# Patient Record
Sex: Female | Born: 1950 | Race: White | Hispanic: No | Smoking: Current every day smoker
Health system: Southern US, Community
[De-identification: ages and names within clinical notes are randomized; demographics above are authoritative.]

## PROBLEM LIST (undated history)

## (undated) DIAGNOSIS — I4510 Unspecified right bundle-branch block: Secondary | ICD-10-CM

## (undated) DIAGNOSIS — F32A Depression, unspecified: Secondary | ICD-10-CM

## (undated) DIAGNOSIS — G629 Polyneuropathy, unspecified: Secondary | ICD-10-CM

## (undated) DIAGNOSIS — Z9289 Personal history of other medical treatment: Secondary | ICD-10-CM

## (undated) DIAGNOSIS — R9439 Abnormal result of other cardiovascular function study: Secondary | ICD-10-CM

## (undated) DIAGNOSIS — I10 Essential (primary) hypertension: Secondary | ICD-10-CM

## (undated) DIAGNOSIS — T7840XA Allergy, unspecified, initial encounter: Secondary | ICD-10-CM

## (undated) DIAGNOSIS — M1990 Unspecified osteoarthritis, unspecified site: Secondary | ICD-10-CM

## (undated) DIAGNOSIS — J449 Chronic obstructive pulmonary disease, unspecified: Secondary | ICD-10-CM

## (undated) DIAGNOSIS — I2510 Atherosclerotic heart disease of native coronary artery without angina pectoris: Secondary | ICD-10-CM

## (undated) DIAGNOSIS — F329 Major depressive disorder, single episode, unspecified: Secondary | ICD-10-CM

## (undated) DIAGNOSIS — Z720 Tobacco use: Secondary | ICD-10-CM

## (undated) DIAGNOSIS — E785 Hyperlipidemia, unspecified: Secondary | ICD-10-CM

## (undated) DIAGNOSIS — W19XXXA Unspecified fall, initial encounter: Secondary | ICD-10-CM

## (undated) DIAGNOSIS — J439 Emphysema, unspecified: Secondary | ICD-10-CM

## (undated) HISTORY — DX: Hyperlipidemia, unspecified: E78.5

## (undated) HISTORY — PX: HAND SURGERY: SHX662

## (undated) HISTORY — PX: FOOT SURGERY: SHX648

## (undated) HISTORY — DX: Allergy, unspecified, initial encounter: T78.40XA

## (undated) HISTORY — PX: EYE SURGERY: SHX253

## (undated) HISTORY — DX: Abnormal result of other cardiovascular function study: R94.39

## (undated) HISTORY — DX: Atherosclerotic heart disease of native coronary artery without angina pectoris: I25.10

## (undated) HISTORY — DX: Essential (primary) hypertension: I10

## (undated) HISTORY — DX: Unspecified right bundle-branch block: I45.10

## (undated) HISTORY — PX: BACK SURGERY: SHX140

## (undated) HISTORY — DX: Tobacco use: Z72.0

## (undated) HISTORY — DX: Chronic obstructive pulmonary disease, unspecified: J44.9

## (undated) HISTORY — PX: TUBAL LIGATION: SHX77

---

## 1976-05-31 HISTORY — PX: ABDOMINAL HYSTERECTOMY: SHX81

## 1998-09-14 ENCOUNTER — Encounter: Payer: Self-pay | Admitting: Emergency Medicine

## 1998-09-14 ENCOUNTER — Emergency Department (HOSPITAL_COMMUNITY): Admission: EM | Admit: 1998-09-14 | Discharge: 1998-09-14 | Payer: Self-pay | Admitting: Emergency Medicine

## 2000-03-16 ENCOUNTER — Other Ambulatory Visit: Admission: RE | Admit: 2000-03-16 | Discharge: 2000-03-16 | Payer: Self-pay | Admitting: Internal Medicine

## 2000-05-27 ENCOUNTER — Ambulatory Visit (HOSPITAL_COMMUNITY): Admission: RE | Admit: 2000-05-27 | Discharge: 2000-05-27 | Payer: Self-pay | Admitting: Cardiovascular Disease

## 2004-08-28 ENCOUNTER — Ambulatory Visit: Payer: Self-pay | Admitting: Internal Medicine

## 2004-11-19 ENCOUNTER — Ambulatory Visit: Payer: Self-pay | Admitting: Internal Medicine

## 2004-11-26 ENCOUNTER — Ambulatory Visit: Payer: Self-pay | Admitting: Internal Medicine

## 2004-12-15 ENCOUNTER — Ambulatory Visit: Payer: Self-pay | Admitting: Gastroenterology

## 2004-12-24 ENCOUNTER — Encounter: Payer: Self-pay | Admitting: Internal Medicine

## 2004-12-24 ENCOUNTER — Ambulatory Visit: Payer: Self-pay | Admitting: Gastroenterology

## 2004-12-24 LAB — HM COLONOSCOPY

## 2005-02-23 ENCOUNTER — Ambulatory Visit: Payer: Self-pay | Admitting: Internal Medicine

## 2005-03-02 ENCOUNTER — Ambulatory Visit: Payer: Self-pay | Admitting: Internal Medicine

## 2005-07-09 ENCOUNTER — Ambulatory Visit: Payer: Self-pay | Admitting: Internal Medicine

## 2005-12-23 ENCOUNTER — Ambulatory Visit: Payer: Self-pay | Admitting: Internal Medicine

## 2005-12-31 ENCOUNTER — Ambulatory Visit: Payer: Self-pay | Admitting: Internal Medicine

## 2006-04-29 ENCOUNTER — Ambulatory Visit: Payer: Self-pay | Admitting: Internal Medicine

## 2006-05-13 ENCOUNTER — Ambulatory Visit: Payer: Self-pay | Admitting: Internal Medicine

## 2006-12-28 ENCOUNTER — Telehealth: Payer: Self-pay | Admitting: Internal Medicine

## 2007-01-19 ENCOUNTER — Telehealth: Payer: Self-pay | Admitting: Internal Medicine

## 2007-01-26 ENCOUNTER — Encounter: Payer: Self-pay | Admitting: Internal Medicine

## 2007-02-25 DIAGNOSIS — J309 Allergic rhinitis, unspecified: Secondary | ICD-10-CM

## 2007-02-25 DIAGNOSIS — J441 Chronic obstructive pulmonary disease with (acute) exacerbation: Secondary | ICD-10-CM

## 2007-02-25 DIAGNOSIS — E785 Hyperlipidemia, unspecified: Secondary | ICD-10-CM | POA: Insufficient documentation

## 2007-02-25 DIAGNOSIS — J45909 Unspecified asthma, uncomplicated: Secondary | ICD-10-CM

## 2007-06-07 ENCOUNTER — Telehealth (INDEPENDENT_AMBULATORY_CARE_PROVIDER_SITE_OTHER): Payer: Self-pay | Admitting: *Deleted

## 2007-06-08 ENCOUNTER — Ambulatory Visit: Payer: Self-pay | Admitting: Internal Medicine

## 2007-06-15 ENCOUNTER — Telehealth: Payer: Self-pay | Admitting: Internal Medicine

## 2007-07-14 ENCOUNTER — Telehealth: Payer: Self-pay | Admitting: Internal Medicine

## 2007-07-26 ENCOUNTER — Ambulatory Visit: Payer: Self-pay | Admitting: Internal Medicine

## 2007-07-26 LAB — CONVERTED CEMR LAB
ALT: 19 units/L (ref 0–35)
AST: 20 units/L (ref 0–37)
Albumin: 3.6 g/dL (ref 3.5–5.2)
Alkaline Phosphatase: 55 units/L (ref 39–117)
BUN: 21 mg/dL (ref 6–23)
Basophils Absolute: 0 10*3/uL (ref 0.0–0.1)
Basophils Relative: 0.5 % (ref 0.0–1.0)
Bilirubin Urine: NEGATIVE
Bilirubin, Direct: 0.2 mg/dL (ref 0.0–0.3)
Blood in Urine, dipstick: NEGATIVE
CO2: 30 meq/L (ref 19–32)
Calcium: 9.4 mg/dL (ref 8.4–10.5)
Chloride: 108 meq/L (ref 96–112)
Cholesterol: 122 mg/dL (ref 0–200)
Creatinine, Ser: 1.1 mg/dL (ref 0.4–1.2)
Eosinophils Absolute: 0.1 10*3/uL (ref 0.0–0.6)
Eosinophils Relative: 2.9 % (ref 0.0–5.0)
GFR calc Af Amer: 66 mL/min
GFR calc non Af Amer: 54 mL/min
Glucose, Bld: 92 mg/dL (ref 70–99)
Glucose, Urine, Semiquant: NEGATIVE
HCT: 43.8 % (ref 36.0–46.0)
HDL: 39.8 mg/dL (ref >39.0)
Hemoglobin: 14.3 g/dL (ref 12.0–15.0)
Ketones, urine, test strip: NEGATIVE
LDL Cholesterol: 70 mg/dL (ref 0–99)
Lymphocytes Relative: 25.7 % (ref 12.0–46.0)
MCHC: 32.6 g/dL (ref 30.0–36.0)
MCV: 95.5 fL (ref 78.0–100.0)
Monocytes Absolute: 0.6 10*3/uL (ref 0.2–0.7)
Monocytes Relative: 11 % (ref 3.0–11.0)
Neutro Abs: 3.1 10*3/uL (ref 1.4–7.7)
Neutrophils Relative %: 59.9 % (ref 43.0–77.0)
Nitrite: NEGATIVE
Platelets: 286 10*3/uL (ref 150–400)
Potassium: 4.6 meq/L (ref 3.5–5.1)
Protein, U semiquant: NEGATIVE
RBC: 4.58 M/uL (ref 3.87–5.11)
RDW: 13.1 % (ref 11.5–14.6)
Sodium: 142 meq/L (ref 135–145)
Specific Gravity, Urine: <1.005
TSH: 1.54 microintl units/mL (ref 0.35–5.50)
Total Bilirubin: 0.7 mg/dL (ref 0.3–1.2)
Total CHOL/HDL Ratio: 3.1
Total Protein: 5.8 g/dL — ABNORMAL LOW (ref 6.0–8.3)
Triglycerides: 59 mg/dL (ref 0–149)
Urobilinogen, UA: 0.2
VLDL: 12 mg/dL (ref 0–40)
WBC Urine, dipstick: NEGATIVE
WBC: 5.1 10*3/uL (ref 4.5–10.5)
pH: 6

## 2007-08-07 ENCOUNTER — Encounter: Payer: Self-pay | Admitting: Internal Medicine

## 2007-08-17 ENCOUNTER — Ambulatory Visit: Payer: Self-pay | Admitting: Internal Medicine

## 2007-08-17 DIAGNOSIS — I10 Essential (primary) hypertension: Secondary | ICD-10-CM

## 2007-08-17 DIAGNOSIS — F17200 Nicotine dependence, unspecified, uncomplicated: Secondary | ICD-10-CM | POA: Insufficient documentation

## 2007-08-17 DIAGNOSIS — R0789 Other chest pain: Secondary | ICD-10-CM

## 2007-08-22 ENCOUNTER — Encounter: Payer: Self-pay | Admitting: Internal Medicine

## 2007-08-25 ENCOUNTER — Ambulatory Visit: Payer: Self-pay

## 2007-08-25 ENCOUNTER — Encounter: Payer: Self-pay | Admitting: Internal Medicine

## 2007-08-29 ENCOUNTER — Encounter: Payer: Self-pay | Admitting: Internal Medicine

## 2007-08-29 LAB — HM MAMMOGRAPHY

## 2007-09-12 ENCOUNTER — Ambulatory Visit: Payer: Self-pay | Admitting: Internal Medicine

## 2007-09-12 LAB — CONVERTED CEMR LAB
Cholesterol, target level: 200 mg/dL
HDL goal, serum: 39 mg/dL
LDL Goal: 70 mg/dL

## 2008-02-27 ENCOUNTER — Ambulatory Visit: Payer: Self-pay | Admitting: Internal Medicine

## 2008-02-27 DIAGNOSIS — M479 Spondylosis, unspecified: Secondary | ICD-10-CM | POA: Insufficient documentation

## 2008-06-26 ENCOUNTER — Ambulatory Visit: Payer: Self-pay | Admitting: Internal Medicine

## 2008-06-26 ENCOUNTER — Encounter: Payer: Self-pay | Admitting: Internal Medicine

## 2008-08-09 ENCOUNTER — Ambulatory Visit: Payer: Self-pay | Admitting: Internal Medicine

## 2008-08-09 LAB — CONVERTED CEMR LAB
ALT: 21 units/L (ref 0–35)
AST: 24 units/L (ref 0–37)
Albumin: 3.4 g/dL — ABNORMAL LOW (ref 3.5–5.2)
Alkaline Phosphatase: 56 units/L (ref 39–117)
BUN: 16 mg/dL (ref 6–23)
Basophils Absolute: 0 10*3/uL (ref 0.0–0.1)
Basophils Relative: 0.4 % (ref 0.0–3.0)
Bilirubin Urine: NEGATIVE
Bilirubin, Direct: 0.1 mg/dL (ref 0.0–0.3)
Blood in Urine, dipstick: NEGATIVE
CO2: 31 meq/L (ref 19–32)
Calcium: 9.1 mg/dL (ref 8.4–10.5)
Chloride: 110 meq/L (ref 96–112)
Cholesterol: 142 mg/dL (ref 0–200)
Creatinine, Ser: 0.9 mg/dL (ref 0.4–1.2)
Eosinophils Absolute: 0.2 10*3/uL (ref 0.0–0.7)
Eosinophils Relative: 3 % (ref 0.0–5.0)
GFR calc Af Amer: 82 mL/min
GFR calc non Af Amer: 68 mL/min
Glucose, Bld: 93 mg/dL (ref 70–99)
Glucose, Urine, Semiquant: NEGATIVE
HCT: 41.3 % (ref 36.0–46.0)
HDL: 44.9 mg/dL (ref >39.0)
Hemoglobin: 14 g/dL (ref 12.0–15.0)
Ketones, urine, test strip: NEGATIVE
LDL Cholesterol: 83 mg/dL (ref 0–99)
Lymphocytes Relative: 20.1 % (ref 12.0–46.0)
MCHC: 34 g/dL (ref 30.0–36.0)
MCV: 95.7 fL (ref 78.0–100.0)
Monocytes Absolute: 0.7 10*3/uL (ref 0.1–1.0)
Monocytes Relative: 12.7 % — ABNORMAL HIGH (ref 3.0–12.0)
Neutro Abs: 3.5 10*3/uL (ref 1.4–7.7)
Neutrophils Relative %: 63.8 % (ref 43.0–77.0)
Nitrite: NEGATIVE
Platelets: 243 10*3/uL (ref 150–400)
Potassium: 4 meq/L (ref 3.5–5.1)
Protein, U semiquant: NEGATIVE
RBC: 4.32 M/uL (ref 3.87–5.11)
RDW: 13.4 % (ref 11.5–14.6)
Sodium: 147 meq/L — ABNORMAL HIGH (ref 135–145)
Specific Gravity, Urine: 1.01
TSH: 1.43 microintl units/mL (ref 0.35–5.50)
Total Bilirubin: 0.9 mg/dL (ref 0.3–1.2)
Total CHOL/HDL Ratio: 3.2
Total Protein: 5.9 g/dL — ABNORMAL LOW (ref 6.0–8.3)
Triglycerides: 72 mg/dL (ref 0–149)
Urobilinogen, UA: 0.2
VLDL: 14 mg/dL (ref 0–40)
WBC Urine, dipstick: NEGATIVE
WBC: 5.5 10*3/uL (ref 4.5–10.5)
pH: 6

## 2008-08-16 ENCOUNTER — Ambulatory Visit: Payer: Self-pay | Admitting: Internal Medicine

## 2008-08-29 ENCOUNTER — Telehealth: Payer: Self-pay | Admitting: *Deleted

## 2008-08-29 ENCOUNTER — Encounter: Payer: Self-pay | Admitting: Internal Medicine

## 2008-08-30 ENCOUNTER — Encounter: Admission: RE | Admit: 2008-08-30 | Discharge: 2008-08-30 | Payer: Self-pay | Admitting: Internal Medicine

## 2008-09-26 ENCOUNTER — Ambulatory Visit: Payer: Self-pay | Admitting: Internal Medicine

## 2008-09-26 ENCOUNTER — Telehealth (INDEPENDENT_AMBULATORY_CARE_PROVIDER_SITE_OTHER): Payer: Self-pay | Admitting: *Deleted

## 2008-09-26 DIAGNOSIS — R0602 Shortness of breath: Secondary | ICD-10-CM

## 2008-09-26 DIAGNOSIS — J45901 Unspecified asthma with (acute) exacerbation: Secondary | ICD-10-CM | POA: Insufficient documentation

## 2008-09-27 ENCOUNTER — Ambulatory Visit: Payer: Self-pay | Admitting: Internal Medicine

## 2008-09-27 DIAGNOSIS — F4321 Adjustment disorder with depressed mood: Secondary | ICD-10-CM | POA: Insufficient documentation

## 2008-09-27 DIAGNOSIS — N318 Other neuromuscular dysfunction of bladder: Secondary | ICD-10-CM | POA: Insufficient documentation

## 2008-11-27 ENCOUNTER — Ambulatory Visit: Payer: Self-pay | Admitting: Internal Medicine

## 2008-11-27 DIAGNOSIS — N750 Cyst of Bartholin's gland: Secondary | ICD-10-CM

## 2009-03-27 ENCOUNTER — Ambulatory Visit: Payer: Self-pay | Admitting: Internal Medicine

## 2009-05-06 ENCOUNTER — Ambulatory Visit: Payer: Self-pay | Admitting: Internal Medicine

## 2009-09-22 ENCOUNTER — Ambulatory Visit: Payer: Self-pay | Admitting: Internal Medicine

## 2010-03-30 ENCOUNTER — Encounter: Payer: Self-pay | Admitting: Internal Medicine

## 2010-03-31 ENCOUNTER — Encounter: Payer: Self-pay | Admitting: Internal Medicine

## 2010-04-02 ENCOUNTER — Ambulatory Visit: Payer: Self-pay | Admitting: Internal Medicine

## 2010-07-02 NOTE — Miscellaneous (Signed)
Summary: Orders Update  Clinical Lists Changes  Orders: Added new Test order of T-2 View CXR (71020TC) - Signed 

## 2010-07-02 NOTE — Assessment & Plan Note (Signed)
Summary: J PT CHEST CONGESTION COUGH/PS   Vital Signs:  Patient profile:   60 year old female Weight:      168 pounds Temp:     98.5 degrees F oral BP sitting:   120 / 82  (left arm) Cuff size:   regular  Vitals Entered By: Duard Brady LPN (September 22, 2009 4:07 PM) CC: c/o non productive cough - tight chest  Is Patient Diabetic? No   CC:  c/o non productive cough - tight chest .  History of Present Illness: 60 year old patient who has a history of COPD and asthma.  For the past several days.  She has had basically a refractory nonproductive cough present.  No sputum production, fever, or shortness of breath.  She has noted some intermittent wheezing.  She has been using the Xanax, as well as her Menest medication without much benefit.  She describes some mild associated chest tightness  Preventive Screening-Counseling & Management  Alcohol-Tobacco     Smoking Status: current  Allergies (verified): No Known Drug Allergies  Past History:  Past Medical History: Reviewed history from 08/17/2007 and no changes required. Allergic rhinitis Hyperlipidemia Asthma COPD Hypertension  Review of Systems       The patient complains of anorexia, hoarseness, and prolonged cough.  The patient denies fever, weight loss, weight gain, vision loss, decreased hearing, chest pain, syncope, dyspnea on exertion, peripheral edema, headaches, hemoptysis, abdominal pain, melena, hematochezia, severe indigestion/heartburn, hematuria, incontinence, genital sores, muscle weakness, suspicious skin lesions, transient blindness, difficulty walking, depression, unusual weight change, abnormal bleeding, enlarged lymph nodes, angioedema, and breast masses.    Physical Exam  General:  Well-developed,well-nourished,in no acute distress; alert,appropriate and cooperative throughout examination Head:  Normocephalic and atraumatic without obvious abnormalities. No apparent alopecia or balding. Ears:   External ear exam shows no significant lesions or deformities.  Otoscopic examination reveals clear canals, tympanic membranes are intact bilaterally without bulging, retraction, inflammation or discharge. Hearing is grossly normal bilaterally. Mouth:  Oral mucosa and oropharynx without lesions or exudates.  Teeth in good repair. Neck:  No deformities, masses, or tenderness noted. Lungs:  rare scattered rhonchi.  No wheezing.  O2 saturation 97% Heart:  Normal rate and regular rhythm. S1 and S2 normal without gallop, murmur, click, rub or other extra sounds. pulse rate 76   Impression & Recommendations:  Problem # 1:  COPD (ICD-496)  Her updated medication list for this problem includes:    Advair Diskus 250-50 Mcg/dose Misc (Fluticasone-salmeterol) .Marland Kitchen... 1 puff two times a day    Xopenex Hfa 45 Mcg/act Aero (Levalbuterol tartrate) .Marland Kitchen..Marland Kitchen Two puff by mouth q 4 hour prn  Her updated medication list for this problem includes:    Advair Diskus 250-50 Mcg/dose Misc (Fluticasone-salmeterol) .Marland Kitchen... 1 puff two times a day    Xopenex Hfa 45 Mcg/act Aero (Levalbuterol tartrate) .Marland Kitchen..Marland Kitchen Two puff by mouth q 4 hour prn  Problem # 2:  TOBACCO USE (ICD-305.1)  Problem # 3:  HYPERTENSION (ICD-401.9)  Her updated medication list for this problem includes:    Furosemide 20 Mg Tabs (Furosemide) ..... One by mouth q am  Her updated medication list for this problem includes:    Furosemide 20 Mg Tabs (Furosemide) ..... One by mouth q am  Complete Medication List: 1)  Vytorin 10-20 Mg Tabs (Ezetimibe-simvastatin) .... Once daily 2)  Celebrex 200 Mg Caps (Celecoxib) .... Two times a day 3)  Fluticasone Propionate 50 Mcg/act Susp (Fluticasone propionate) .... Two sprays q nare daily 4)  Advair Diskus 250-50 Mcg/dose Misc (Fluticasone-salmeterol) .Marland Kitchen.. 1 puff two times a day 5)  Zyrtec Allergy 10 Mg Tabs (Cetirizine hcl) .... Once daily 6)  Latisse 0.03 % Soln (Bimatoprost) .... Apply to eye lid  as directed 7)   Ambien Cr 12.5 Mg Cr-tabs (Zolpidem tartrate) .... One by mouth q hs 8)  Alprazolam 0.25 Mg Tabs (Alprazolam) .... One by mouth every 6 hours prn 9)  Xopenex Hfa 45 Mcg/act Aero (Levalbuterol tartrate) .... Two puff by mouth q 4 hour prn 10)  Mupirocin 2 % Oint (Mupirocin) .... App;ly to site bid 11)  Furosemide 20 Mg Tabs (Furosemide) .... One by mouth q am 12)  Aspir-low 81 Mg Tbec (Aspirin) .... Qd 13)  Hydrocodone-homatropine 5-1.5 Mg/5ml Syrp (Hydrocodone-homatropine) .Marland Kitchen.. 1 teaspoon every 6 hours as needed for cough  Patient Instructions: 1)  Please schedule a follow-up appointment as needed. 2)  Drink as much fluid as you can tolerate for the next few days. Prescriptions: HYDROCODONE-HOMATROPINE 5-1.5 MG/5ML SYRP (HYDROCODONE-HOMATROPINE) 1 teaspoon every 6 hours as needed for cough  #6 oz x 2   Entered and Authorized by:   Gordy Savers  MD   Signed by:   Gordy Savers  MD on 09/22/2009   Method used:   Print then Give to Patient   RxID:   [id number]   Appended Document: J PT CHEST CONGESTION COUGH/PS     Allergies: No Known Drug Allergies   Complete Medication List: 1)  Vytorin 10-20 Mg Tabs (Ezetimibe-simvastatin) .... Once daily 2)  Celebrex 200 Mg Caps (Celecoxib) .... Two times a day 3)  Fluticasone Propionate 50 Mcg/act Susp (Fluticasone propionate) .... Two sprays q nare daily 4)  Advair Diskus 250-50 Mcg/dose Misc (Fluticasone-salmeterol) .Marland Kitchen.. 1 puff two times a day 5)  Zyrtec Allergy 10 Mg Tabs (Cetirizine hcl) .... Once daily 6)  Latisse 0.03 % Soln (Bimatoprost) .... Apply to eye lid  as directed 7)  Ambien Cr 12.5 Mg Cr-tabs (Zolpidem tartrate) .... One by mouth q hs 8)  Alprazolam 0.25 Mg Tabs (Alprazolam) .... One by mouth every 6 hours prn 9)  Xopenex Hfa 45 Mcg/act Aero (Levalbuterol tartrate) .... Two puff by mouth q 4 hour prn 10)  Mupirocin 2 % Oint (Mupirocin) .... App;ly to site bid 11)  Furosemide 20 Mg Tabs (Furosemide) .... One  by mouth q am 12)  Aspir-low 81 Mg Tbec (Aspirin) .... Qd 13)  Hydrocodone-homatropine 5-1.5 Mg/26ml Syrp (Hydrocodone-homatropine) .Marland Kitchen.. 1 teaspoon every 6 hours as needed for cough  Other Orders: Depo- Medrol 80mg  (J1040) Admin of Therapeutic Inj  intramuscular or subcutaneous ([id number])    Medication Administration  Injection # 1:    Medication: Depo- Medrol 80mg     Diagnosis: ASTHMA (ICD-493.90)    Route: IM    Site: LUOQ gluteus    Exp Date: 03/2012    Lot #: [id number]    Mfr: Pharmacia    Patient tolerated injection without complications    Given by: Duard Brady LPN (September 22, 2009 5:05 PM)  Orders Added: 1)  Depo- Medrol 80mg  [J1040] 2)  Admin of Therapeutic Inj  intramuscular or subcutaneous [[id number]]

## 2010-07-02 NOTE — Letter (Signed)
Summary: Fax from patient requesting order for Chest  X-Ray  Fax from patient requesting order for Chest  X-Ray   Imported By: Maryln Gottron 04/03/2010 12:18:09  _____________________________________________________________________  External Attachment:    Type:   Image     Comment:   External Document

## 2010-10-16 NOTE — Cardiovascular Report (Signed)
[place]  Patient:    Tara Cruz, Tara Cruz                   MRN: [id number] Proc. Date: 05/27/00 Adm. Date:  [id number] Attending:  Colon Branch CC:         Dietrich Pates, M.D. [facility]             Dr. Lovell Sheehan                        Cardiac Catheterization  PROCEDURE PERFORMED:  Coronary arteriography.  INDICATIONS:  Recurrent chest pain with Cardiolite suggesting inferobasal ischemia.  CORONARY ARTERIOGRAPHY:  Left main coronary artery:  The left main coronary artery was long.  It was normal.  Left anterior descending artery:  The left anterior descending artery was normal.  First and second diagonal branches are normal.  Circumflex coronary artery:  The circumflex coronary artery was normal.  Right coronary artery:  The right coronary artery was dominant and it was normal.  RIGHT ANTERIOR OBLIQUE VENTRICULOGRAPHY:  The RAO ventriculography revealed mild global hypokinesis.  Ejection fraction was in the 50-55% range.  There was no gradient across the aortic valve and no MR.  Aortic pressure was 149/80, LV pressure was 149/60.  IMPRESSION:  The patients chest pain would appear to be noncardiac in etiology.  Her ejection fraction was normal by Cardiolite study at 58%. However, I do think it is worthwhile to follow up in three months with an echocardiogram and MUGA scan to make sure she does not have a nonischemic cardiomyopathy that is developing.  She will be discharged later today if her leg heals well.  She will follow up with Dr. Tenny Craw in a month. DD:  05/27/00 TD:  05/27/00 Job: [id number]

## 2010-11-13 ENCOUNTER — Other Ambulatory Visit: Payer: Self-pay | Admitting: Internal Medicine

## 2010-12-04 ENCOUNTER — Other Ambulatory Visit (INDEPENDENT_AMBULATORY_CARE_PROVIDER_SITE_OTHER): Payer: 59

## 2010-12-04 DIAGNOSIS — Z0000 Encounter for general adult medical examination without abnormal findings: Secondary | ICD-10-CM

## 2010-12-04 LAB — CBC WITH DIFFERENTIAL/PLATELET
Basophils Absolute: 0 10*3/uL (ref 0.0–0.1)
Eosinophils Absolute: 0.1 10*3/uL (ref 0.0–0.7)
HCT: 40.7 % (ref 36.0–46.0)
Hemoglobin: 13.9 g/dL (ref 12.0–15.0)
Lymphs Abs: 1.3 10*3/uL (ref 0.7–4.0)
MCHC: 34.2 g/dL (ref 30.0–36.0)
MCV: 93.8 fl (ref 78.0–100.0)
Monocytes Absolute: 0.5 10*3/uL (ref 0.1–1.0)
Neutro Abs: 3.4 10*3/uL (ref 1.4–7.7)
RDW: 15.9 % — ABNORMAL HIGH (ref 11.5–14.6)

## 2010-12-04 LAB — BASIC METABOLIC PANEL
CO2: 28 mEq/L (ref 19–32)
Calcium: 8.8 mg/dL (ref 8.4–10.5)
Glucose, Bld: 85 mg/dL (ref 70–99)
Potassium: 3.8 mEq/L (ref 3.5–5.1)
Sodium: 145 mEq/L (ref 135–145)

## 2010-12-04 LAB — LIPID PANEL
Cholesterol: 139 mg/dL (ref 0–200)
HDL: 53.1 mg/dL (ref >39.00)
VLDL: 16 mg/dL (ref 0.0–40.0)

## 2010-12-04 LAB — POCT URINALYSIS DIPSTICK
Ketones, UA: NEGATIVE
Protein, UA: NEGATIVE
Spec Grav, UA: 1.005
Urobilinogen, UA: 0.2
pH, UA: 5.5

## 2010-12-04 LAB — HEPATIC FUNCTION PANEL: Albumin: 4 g/dL (ref 3.5–5.2)

## 2010-12-04 LAB — TSH: TSH: 2.39 u[IU]/mL (ref 0.35–5.50)

## 2010-12-09 ENCOUNTER — Encounter: Payer: Self-pay | Admitting: Internal Medicine

## 2010-12-11 ENCOUNTER — Encounter: Payer: Self-pay | Admitting: Internal Medicine

## 2010-12-11 ENCOUNTER — Ambulatory Visit (INDEPENDENT_AMBULATORY_CARE_PROVIDER_SITE_OTHER): Payer: 59 | Admitting: Internal Medicine

## 2010-12-11 DIAGNOSIS — J45909 Unspecified asthma, uncomplicated: Secondary | ICD-10-CM

## 2010-12-11 DIAGNOSIS — I10 Essential (primary) hypertension: Secondary | ICD-10-CM

## 2010-12-11 DIAGNOSIS — F17200 Nicotine dependence, unspecified, uncomplicated: Secondary | ICD-10-CM

## 2010-12-11 DIAGNOSIS — M25559 Pain in unspecified hip: Secondary | ICD-10-CM

## 2010-12-11 DIAGNOSIS — N318 Other neuromuscular dysfunction of bladder: Secondary | ICD-10-CM

## 2010-12-11 DIAGNOSIS — E785 Hyperlipidemia, unspecified: Secondary | ICD-10-CM

## 2010-12-11 DIAGNOSIS — M25552 Pain in left hip: Secondary | ICD-10-CM | POA: Insufficient documentation

## 2010-12-11 DIAGNOSIS — Z0000 Encounter for general adult medical examination without abnormal findings: Secondary | ICD-10-CM

## 2010-12-11 MED ORDER — NABUMETONE 750 MG PO TABS: 750.0000 mg | ORAL_TABLET | Freq: Two times a day (BID) | ORAL | Status: DC

## 2010-12-11 NOTE — Assessment & Plan Note (Signed)
Patient has been on Celebrex 200 twice a day for long-term and this drug seems to have lost its pain control and effectiveness in controlling her osteoarthritis we will change her to Relafen 750 one by mouth twice a day

## 2011-01-01 NOTE — Progress Notes (Signed)
Subjective:    Patient ID: Tara Cruz, female    DOB: [birth year], 60 y.o.   MRN: [id number]  HPI presents for complete physical examination.  Comorbid problems include persistent tobacco abuse hyperlipidemia mild depression with anxiety hypertension and a history of asthma still smoking    Review of Systems  Constitutional: Negative for activity change, appetite change and fatigue.  HENT: Negative for ear pain, congestion, neck pain, postnasal drip and sinus pressure.   Eyes: Negative for redness and visual disturbance.  Respiratory: Negative for cough, shortness of breath and wheezing.   Gastrointestinal: Negative for abdominal pain and abdominal distention.  Genitourinary: Negative for dysuria, frequency and menstrual problem.  Musculoskeletal: Negative for myalgias, joint swelling and arthralgias.  Skin: Negative for rash and wound.  Neurological: Negative for dizziness, weakness and headaches.  Hematological: Negative for adenopathy. Does not bruise/bleed easily.  Psychiatric/Behavioral: Positive for dysphoric mood and decreased concentration. Negative for sleep disturbance.   Past Medical History  Diagnosis Date  . Allergy   . Hyperlipidemia   . Asthma   . COPD (chronic obstructive pulmonary disease)   . Hypertension    Past Surgical History  Procedure Date  . Abdominal hysterectomy   . Foot surgery     reports that she has been smoking.  She does not have any smokeless tobacco history on file. She reports that she does not drink alcohol or use illicit drugs. family history includes Breast cancer in her sister and Coronary artery disease in her father and mother. No Known Allergies      Objective:   Physical Exam  Nursing note and vitals reviewed. Constitutional: She is oriented to person, place, and time. She appears well-developed and well-nourished. No distress.  HENT:  Head: Normocephalic and atraumatic.  Right Ear: External ear normal.  Left Ear:  External ear normal.  Nose: Nose normal.  Mouth/Throat: Oropharynx is clear and moist.  Eyes: Conjunctivae and EOM are normal. Pupils are equal, round, and reactive to light.  Neck: Normal range of motion. Neck supple. No JVD present. No tracheal deviation present. No thyromegaly present.  Cardiovascular: Normal rate, regular rhythm, normal heart sounds and intact distal pulses.   No murmur heard. Pulmonary/Chest: Effort normal and breath sounds normal. She has no wheezes. She exhibits no tenderness.  Abdominal: Soft. Bowel sounds are normal.  Musculoskeletal: Normal range of motion. She exhibits no edema and no tenderness.  Lymphadenopathy:    She has no cervical adenopathy.  Neurological: She is alert and oriented to person, place, and time. She has normal reflexes. No cranial nerve deficit.  Skin: Skin is warm and dry. She is not diaphoretic.  Psychiatric: Her behavior is normal.          Assessment & Plan:   This is a routine physical examination for this healthy  Female. Reviewed all health maintenance protocols including mammography colonoscopy bone density and reviewed appropriate screening labs. Her immunization history was reviewed as well as her current medications and allergies refills of her chronic medications were given and the plan for yearly health maintenance was discussed all orders and referrals were made as appropriate.   We discussed her smoking cessation and the need to cut back on tobacco use she expressed understanding for this and we discussed therapeutic options with her this point she wishes to consider them and get back in touch with [place].  We discussed her adjustment disorder with depressed mood and the role that exercise getting out of the house will play  in improving her mood.  Her blood pressure was stable she had no excessive use of her rescue inhaler in her asthma and finally we addressed her overactive bladder with possible referral to urology we  discussed exercises and diction before bed

## 2011-01-01 NOTE — Patient Instructions (Signed)
Smoking Cessation This document explains the best ways for you to quit smoking and new treatments to help. It lists new medicines that can double or triple your chances of quitting and quitting for good. It also considers ways to avoid relapses and concerns you may have about quitting, including weight gain. NICOTINE: A POWERFUL ADDICTION If you have tried to quit smoking, you know how hard it can be. It is hard because nicotine is a very addictive drug. For some people, it can be as addictive as heroin or cocaine. Usually, people make 2 or 3 tries, or more, before finally being able to quit. Each time you try to quit, you can learn about what helps and what hurts. Quitting takes hard work and a lot of effort, but you can quit smoking. QUITTING SMOKING IS ONE OF THE MOST IMPORTANT THINGS YOU WILL EVER DO:  You will live longer, feel better, and live better.   The impact on your body of quitting smoking is felt almost immediately:   Within 20 minutes, blood pressure decreases. Pulse returns to its normal level.   After 8 hours, carbon monoxide levels in the blood return to normal. Oxygen level increases.   After 24 hours, chance of heart attack starts to decrease. Breath, hair, and body stop smelling like smoke.   After 48 hours, damaged nerve endings begin to recover. Sense of taste and smell improve.   After 72 hours, the body is virtually free of nicotine. Bronchial tubes relax and breathing becomes easier.   After 2 to 12 weeks, lungs can hold more air. Exercise becomes easier and circulation improves.   Quitting will lower your chance of having a heart attack, stroke, cancer, or lung disease:   After 1 year, the risk of coronary heart disease is cut in half.   After 5 years, the risk of stroke falls to the same as a nonsmoker.   After 10 years, the risk of lung cancer is cut in half and the risk of other cancers decreases significantly.   After 15 years, the risk of coronary heart  disease drops, usually to the level of a nonsmoker.   If you are pregnant, quitting smoking will improve your chances of having a healthy baby.   The people you live with, especially your children, will be healthier.   You will have extra money to spend on things other than cigarettes.  FIVE KEYS TO QUITTING Studies have shown that these 5 steps will help you quit smoking and quit for good. You have the best chances of quitting if you use them together: 1. Get ready.  2. Get support and encouragement.  3. Learn new skills and behaviors.  4. Get medicine to reduce your nicotine addiction and use it correctly.  5. Be prepared for relapse or difficult situations. Be determined to continue trying to quit, even if you do not succeed at first.  1. GET READY  Set a quit date.   Change your environment.   Get rid of ALL cigarettes, ashtrays, matches, and lighters in your home, car, and place of work.   Do not let people smoke in your home.   Review your past attempts to quit. Think about what worked and what did not.   Once you quit, do not smoke. NOT EVEN A PUFF!  2. GET SUPPORT AND ENCOURAGEMENT Studies have shown that you have a better chance of being successful if you have help. You can get support in many ways.  Tell   your family, friends, and coworkers that you are going to quit and need their support. Ask them not to smoke around you.   Talk to your caregivers (doctor, dentist, nurse, pharmacist, psychologist, and/or smoking counselor).   Get individual, group, or telephone counseling and support. The more counseling you have, the better your chances are of quitting. Programs are available at local hospitals and health centers. Call your local health department for information about programs in your area.   Spiritual beliefs and practices may help some smokers quit.   Quit meters are small computer programs online or downloadable that keep track of quit statistics, such as amount  of "quit-time," cigarettes not smoked, and money saved.   Many smokers find one or more of the many self-help books available useful in helping them quit and stay off tobacco.  3. LEARN NEW SKILLS AND BEHAVIORS  Try to distract yourself from urges to smoke. Talk to someone, go for a walk, or occupy your time with a task.   When you first try to quit, change your routine. Take a different route to work. Drink tea instead of coffee. Eat breakfast in a different place.   Do something to reduce your stress. Take a hot bath, exercise, or read a book.   Plan something enjoyable to do every day. Reward yourself for not smoking.   Explore interactive web-based programs that specialize in helping you quit.  4. GET MEDICINE AND USE IT CORRECTLY Medicines can help you stop smoking and decrease the urge to smoke. Combining medicine with the above behavioral methods and support can quadruple your chances of successfully quitting smoking. The [place] Food and Drug Administration (FDA) has approved 7 medicines to help you quit smoking. These medicines fall into 3 categories.  Nicotine replacement therapy (delivers nicotine to your body without the negative effects and risks of smoking):   Nicotine gum: Available over-the-counter.   Nicotine lozenges: Available over-the-counter.   Nicotine inhaler: Available by prescription.   Nicotine nasal spray: Available by prescription.   Nicotine skin patches (transdermal): Available by prescription and over-the-counter.   Antidepressant medicine (helps people abstain from smoking, but how this works is unknown):   Bupropion sustained-release (SR) tablets: Available by prescription.   Nicotinic receptor partial agonist (simulates the effect of nicotine in your brain):   Varenicline tartrate tablets: Available by prescription.   Ask your caregiver for advice about which medicines to use and how to use them. Carefully read the information on the package.    Everyone who is trying to quit may benefit from using a medicine. If you are pregnant or trying to become pregnant, nursing an infant, you are under age 18, or you smoke fewer than 10 cigarettes per day, talk to your caregiver before taking any nicotine replacement medicines.   You should stop using a nicotine replacement product and call your caregiver if you experience nausea, dizziness, weakness, vomiting, fast or irregular heartbeat, mouth problems with the lozenge or gum, or redness or swelling of the skin around the patch that does not go away.   Do not use any other product containing nicotine while using a nicotine replacement product.   Talk to your caregiver before using these products if you have diabetes, heart disease, asthma, stomach ulcers, you had a recent heart attack, you have high blood pressure that is not controlled with medicine, a history of irregular heartbeat, or you have been prescribed medicine to help you quit smoking.  5. BE PREPARED FOR RELAPSE OR   DIFFICULT SITUATIONS  Most relapses occur within the first 3 months after quitting. Do not be discouraged if you start smoking again. Remember, most people try several times before they finally quit.   You may have symptoms of withdrawal because your body is used to nicotine. You may crave cigarettes, be irritable, feel very hungry, cough often, get headaches, or have difficulty concentrating.   The withdrawal symptoms are only temporary. They are strongest when you first quit, but they will go away within 10 to 14 days.  Here are some difficult situations to watch for:  Alcohol. Avoid drinking alcohol. Drinking lowers your chances of successfully quitting.   Caffeine. Try to reduce the amount of caffeine you consume. It also lowers your chances of successfully quitting.   Other smokers. Being around smoking can make you want to smoke. Avoid smokers.   Weight gain. Many smokers will gain weight when they quit, usually  less than 10 pounds. Eat a healthy diet and stay active. Do not let weight gain distract you from your main goal, quitting smoking. Some medicines that help you quit smoking may also help delay weight gain. You can always lose the weight gained after you quit.   Bad mood or depression. There are a lot of ways to improve your mood other than smoking.  If you are having problems with any of these situations, talk to your caregiver. SPECIAL SITUATIONS OR CONDITIONS Studies suggest that everyone can quit smoking. Your situation or condition can give you a special reason to quit.  Pregnant women/New mothers: By quitting, you protect your baby's health and your own.   Hospitalized patients: By quitting, you reduce health problems and help healing.   Heart attack patients: By quitting, you reduce your risk of a second heart attack.   Lung, head, and neck cancer patients: By quitting, you reduce your chance of a second cancer.   Parents of children and adolescents: By quitting, you protect your children from illnesses caused by secondhand smoke.  QUESTIONS TO THINK ABOUT Think about the following questions before you try to stop smoking. You may want to talk about your answers with your caregiver.  Why do you want to quit?   If you tried to quit in the past, what helped and what did not?   What will be the most difficult situations for you after you quit? How will you plan to handle them?   Who can help you through the tough times? Your family? Friends? Caregiver?   What pleasures do you get from smoking? What ways can you still get pleasure if you quit?  Here are some questions to ask your caregiver:  How can you help me to be successful at quitting?   What medicine do you think would be best for me and how should I take it?   What should I do if I need more help?   What is smoking withdrawal like? How can I get information on withdrawal?  Quitting takes hard work and a lot of effort,  but you can quit smoking. FOR MORE INFORMATION Smokefree.gov (http://www.smokefree.gov) provides free, accurate, evidence-based information and professional assistance to help support the immediate and long-term needs of people trying to quit smoking. Document Released: 05/11/2001 Document Re-Released: 11/04/2009 Exit[employer] Patient Information 2011 [employer]. 

## 2011-01-06 ENCOUNTER — Other Ambulatory Visit: Payer: Self-pay | Admitting: Internal Medicine

## 2011-01-07 ENCOUNTER — Other Ambulatory Visit: Payer: Self-pay | Admitting: *Deleted

## 2011-01-07 DIAGNOSIS — M25551 Pain in right hip: Secondary | ICD-10-CM

## 2011-01-07 MED ORDER — FUROSEMIDE 20 MG PO TABS: 20.0000 mg | ORAL_TABLET | Freq: Every day | ORAL | Status: DC

## 2011-01-07 MED ORDER — TOLTERODINE TARTRATE ER 2 MG PO CP24: 2.0000 mg | ORAL_CAPSULE | Freq: Every day | ORAL | Status: DC

## 2011-01-07 MED ORDER — EZETIMIBE-SIMVASTATIN 10-20 MG PO TABS: 1.0000 | ORAL_TABLET | Freq: Every day | ORAL | Status: DC

## 2011-01-07 MED ORDER — CETIRIZINE HCL 10 MG PO TABS: 10.0000 mg | ORAL_TABLET | Freq: Every day | ORAL | Status: DC

## 2011-01-07 MED ORDER — NABUMETONE 750 MG PO TABS: 750.0000 mg | ORAL_TABLET | Freq: Two times a day (BID) | ORAL | Status: AC

## 2011-01-07 MED ORDER — ESTRADIOL 0.5 MG PO TABS: 0.5000 mg | ORAL_TABLET | Freq: Every day | ORAL | Status: DC

## 2011-01-07 MED ORDER — FLUTICASONE-SALMETEROL 250-50 MCG/DOSE IN AEPB: 1.0000 | INHALATION_SPRAY | Freq: Two times a day (BID) | RESPIRATORY_TRACT | Status: DC

## 2011-01-07 MED ORDER — ZOLPIDEM TARTRATE ER 12.5 MG PO TBCR: 12.5000 mg | EXTENDED_RELEASE_TABLET | Freq: Every evening | ORAL | Status: DC | PRN

## 2011-01-07 MED ORDER — CELECOXIB 200 MG PO CAPS: 200.0000 mg | ORAL_CAPSULE | Freq: Two times a day (BID) | ORAL | Status: DC

## 2011-01-07 MED ORDER — FLUTICASONE PROPIONATE 50 MCG/ACT NA SUSP: 2.0000 | Freq: Every day | NASAL | Status: DC

## 2011-01-07 MED ORDER — EZETIMIBE-SIMVASTATIN 10-20 MG PO TABS: 1.0000 | ORAL_TABLET | Freq: Two times a day (BID) | ORAL | Status: DC | PRN

## 2011-01-14 ENCOUNTER — Other Ambulatory Visit: Payer: Self-pay | Admitting: *Deleted

## 2011-01-14 MED ORDER — FLUTICASONE-SALMETEROL 250-50 MCG/DOSE IN AEPB: 1.0000 | INHALATION_SPRAY | Freq: Two times a day (BID) | RESPIRATORY_TRACT | Status: DC

## 2011-06-11 ENCOUNTER — Other Ambulatory Visit: Payer: Self-pay | Admitting: Internal Medicine

## 2012-03-01 ENCOUNTER — Other Ambulatory Visit: Payer: Self-pay | Admitting: Internal Medicine

## 2012-03-29 ENCOUNTER — Other Ambulatory Visit: Payer: Self-pay | Admitting: *Deleted

## 2012-03-29 DIAGNOSIS — F17200 Nicotine dependence, unspecified, uncomplicated: Secondary | ICD-10-CM

## 2012-04-06 ENCOUNTER — Telehealth: Payer: Self-pay | Admitting: Internal Medicine

## 2012-04-06 ENCOUNTER — Ambulatory Visit (INDEPENDENT_AMBULATORY_CARE_PROVIDER_SITE_OTHER)
Admission: RE | Admit: 2012-04-06 | Discharge: 2012-04-06 | Disposition: A | Discharge status: Home / Self Care | Payer: 59 | Source: Ambulatory Visit | Attending: Internal Medicine | Admitting: Internal Medicine

## 2012-04-06 DIAGNOSIS — F17200 Nicotine dependence, unspecified, uncomplicated: Secondary | ICD-10-CM

## 2012-04-06 NOTE — Telephone Encounter (Signed)
Pt requesting CPX by end of year. Pt aware no open appts, but if we cannot get here in for CPX, could she have labs done only and CPX done later early next year? Pt work # [phone]

## 2012-04-10 NOTE — Telephone Encounter (Signed)
That will be fine per dr jenkins 

## 2012-04-11 NOTE — Telephone Encounter (Signed)
Pt is sch for cpx labs 05-11-2012 and cpx 06-28-2012 11.30am

## 2012-05-11 ENCOUNTER — Other Ambulatory Visit: Payer: 59

## 2012-05-15 ENCOUNTER — Other Ambulatory Visit (INDEPENDENT_AMBULATORY_CARE_PROVIDER_SITE_OTHER): Payer: 59

## 2012-05-15 DIAGNOSIS — Z0000 Encounter for general adult medical examination without abnormal findings: Secondary | ICD-10-CM

## 2012-05-15 LAB — POCT URINALYSIS DIPSTICK
Protein, UA: NEGATIVE
Spec Grav, UA: 1.02
Urobilinogen, UA: 0.2

## 2012-05-15 LAB — BASIC METABOLIC PANEL
BUN: 23 mg/dL (ref 6–23)
Chloride: 107 mEq/L (ref 96–112)
Glucose, Bld: 89 mg/dL (ref 70–99)
Potassium: 3.8 mEq/L (ref 3.5–5.1)

## 2012-05-15 LAB — HEPATIC FUNCTION PANEL
ALT: 16 U/L (ref 0–35)
Albumin: 4 g/dL (ref 3.5–5.2)
Total Bilirubin: 0.8 mg/dL (ref 0.3–1.2)

## 2012-05-15 LAB — CBC WITH DIFFERENTIAL/PLATELET
Basophils Absolute: 0 10*3/uL (ref 0.0–0.1)
Eosinophils Relative: 2.3 % (ref 0.0–5.0)
HCT: 43 % (ref 36.0–46.0)
Lymphs Abs: 1.6 10*3/uL (ref 0.7–4.0)
MCV: 94.8 fl (ref 78.0–100.0)
Monocytes Absolute: 0.7 10*3/uL (ref 0.1–1.0)
Platelets: 252 10*3/uL (ref 150.0–400.0)
RDW: 14.1 % (ref 11.5–14.6)

## 2012-05-15 LAB — LIPID PANEL
LDL Cholesterol: 70 mg/dL (ref 0–99)
Total CHOL/HDL Ratio: 3

## 2012-05-15 LAB — TSH: TSH: 1.76 u[IU]/mL (ref 0.35–5.50)

## 2012-05-31 DIAGNOSIS — Z9289 Personal history of other medical treatment: Secondary | ICD-10-CM

## 2012-05-31 HISTORY — DX: Personal history of other medical treatment: Z92.89

## 2012-06-28 ENCOUNTER — Encounter: Payer: Self-pay | Admitting: Internal Medicine

## 2012-06-28 ENCOUNTER — Encounter: Payer: 59 | Admitting: Internal Medicine

## 2012-06-28 ENCOUNTER — Ambulatory Visit (INDEPENDENT_AMBULATORY_CARE_PROVIDER_SITE_OTHER): Payer: Self-pay | Admitting: Internal Medicine

## 2012-06-28 VITALS — BP 128/80 | HR 76 | Temp 98.2°F | Resp 16 | Ht 66.5 in | Wt 162.0 lb

## 2012-06-28 DIAGNOSIS — N3281 Overactive bladder: Secondary | ICD-10-CM

## 2012-06-28 DIAGNOSIS — Z0000 Encounter for general adult medical examination without abnormal findings: Secondary | ICD-10-CM

## 2012-06-28 DIAGNOSIS — R0989 Other specified symptoms and signs involving the circulatory and respiratory systems: Secondary | ICD-10-CM

## 2012-06-28 DIAGNOSIS — N318 Other neuromuscular dysfunction of bladder: Secondary | ICD-10-CM

## 2012-06-28 DIAGNOSIS — I10 Essential (primary) hypertension: Secondary | ICD-10-CM

## 2012-06-28 DIAGNOSIS — F959 Tic disorder, unspecified: Secondary | ICD-10-CM

## 2012-06-28 LAB — SEDIMENTATION RATE: Sed Rate: 6 mm/hr (ref 0–22)

## 2012-06-28 MED ORDER — ZOLPIDEM TARTRATE ER 12.5 MG PO TBCR: 12.5000 mg | EXTENDED_RELEASE_TABLET | Freq: Every evening | ORAL | Status: DC | PRN

## 2012-06-28 MED ORDER — TOLTERODINE TARTRATE ER 4 MG PO CP24: 4.0000 mg | ORAL_CAPSULE | Freq: Every day | ORAL | Status: DC

## 2012-06-28 MED ORDER — TOLTERODINE TARTRATE ER 2 MG PO CP24: 4.0000 mg | ORAL_CAPSULE | Freq: Every day | ORAL | Status: DC

## 2012-06-28 NOTE — Patient Instructions (Signed)
Please sign up for my chart to notify me in about 2-3 weeks how the increased dose of Detrol is doing for your urinary frequency

## 2012-06-28 NOTE — Progress Notes (Signed)
Subjective:    Patient ID: Tara Cruz, female    DOB: 06-08-1950, 62 y.o.   MRN: [id number]  HPI  CPX Followed for Asthma with COPD ( former smoker), HTN and hyperlipidemia and overactive bladder  Review of Systems  Constitutional: Negative for activity change, appetite change and fatigue.  HENT: Negative for ear pain, congestion, neck pain, postnasal drip and sinus pressure.   Eyes: Negative for redness and visual disturbance.  Respiratory: Negative for cough, shortness of breath and wheezing.   Gastrointestinal: Negative for abdominal pain and abdominal distention.  Genitourinary: Negative for dysuria, frequency and menstrual problem.  Musculoskeletal: Negative for myalgias, joint swelling and arthralgias.  Skin: Negative for rash and wound.  Neurological: Positive for numbness. Negative for dizziness, weakness and headaches.       Right hand side of the face in 5th cranial nerve?  Hematological: Negative for adenopathy. Does not bruise/bleed easily.  Psychiatric/Behavioral: Negative for sleep disturbance and decreased concentration.   Past Medical History  Diagnosis Date  . Allergy   . Hyperlipidemia   . Asthma   . COPD (chronic obstructive pulmonary disease)   . Hypertension     History   Social History  . Marital Status: Married    Spouse Name: N/A    Number of Children: N/A  . Years of Education: N/A   Occupational History  . Not on file.   Social History Main Topics  . Smoking status: Current Every Day Smoker  . Smokeless tobacco: Not on file  . Alcohol Use: No  . Drug Use: No  . Sexually Active: Yes   Other Topics Concern  . Not on file   Social History Narrative  . No narrative on file    Past Surgical History  Procedure Date  . Abdominal hysterectomy   . Foot surgery     Family History  Problem Relation Age of Onset  . Coronary artery disease Mother   . Coronary artery disease Father   . Breast cancer Sister     No Known  Allergies  Current Outpatient Prescriptions on File Prior to Visit  Medication Sig Dispense Refill  . aspirin 81 MG tablet Take 81 mg by mouth daily.        . CELEBREX 200 MG capsule TAKE 1 CAPSULE TWICE DAILY  180 capsule  2  . cetirizine (ZYRTEC) 10 MG tablet Take 1 tablet (10 mg total) by mouth daily.  90 tablet  3  . DETROL LA 2 MG 24 hr capsule TAKE 1 CAPSULE DAILY  90 capsule  2  . estradiol (ESTRACE) 0.5 MG tablet TAKE 1 TABLET DAILY  90 tablet  2  . fluticasone (FLONASE) 50 MCG/ACT nasal spray Place 2 sprays into the nose daily.  48 g  3  . Fluticasone-Salmeterol (ADVAIR DISKUS) 250-50 MCG/DOSE AEPB Inhale 1 puff into the lungs every 12 (twelve) hours.  3 each  3  . furosemide (LASIX) 20 MG tablet Take 1 tablet (20 mg total) by mouth daily.  90 tablet  3  . mupirocin (BACTROBAN) 2 % ointment Apply topically 3 (three) times daily.        Marland Kitchen tolterodine (DETROL LA) 2 MG 24 hr capsule Take 1 capsule (2 mg total) by mouth daily.  30 capsule  1  . VYTORIN 10-20 MG per tablet TAKE 1 TABLET DAILY  90 tablet  2  . XOPENEX HFA 45 MCG/ACT inhaler INHALE 2 PUFFS EVERY 4 HOURS AS NEEDED  3 Inhaler  3  .  zolpidem (AMBIEN CR) 12.5 MG CR tablet Take 1 tablet (12.5 mg total) by mouth at bedtime as needed for sleep.  30 tablet  3    BP 128/80  Pulse 76  Temp 98.2 F (36.8 C)  Resp 16  Ht 5' 6.5" (1.689 m)  Wt 162 lb (73.483 kg)  BMI 25.76 kg/m2        Objective:   Physical Exam  Nursing note and vitals reviewed. Constitutional: She is oriented to person, place, and time. She appears well-developed and well-nourished. No distress.  HENT:  Head: Normocephalic and atraumatic.  Eyes: Conjunctivae normal and EOM are normal. Pupils are equal, round, and reactive to light.  Neck: Normal range of motion. Neck supple. No JVD present. No tracheal deviation present. No thyromegaly present.  Cardiovascular: Normal rate, regular rhythm and normal heart sounds.   No murmur heard. Pulmonary/Chest:  Effort normal and breath sounds normal. She has no wheezes. She exhibits no tenderness.  Abdominal: Soft. Bowel sounds are normal.  Musculoskeletal: Normal range of motion. She exhibits no edema and no tenderness.  Lymphadenopathy:    She has no cervical adenopathy.  Neurological: She is alert and oriented to person, place, and time. She has normal reflexes. A cranial nerve deficit is present.       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 She is not diaphoretic.  Psychiatric: She has a normal mood and affect. Her behavior is normal.          Assessment & Plan:   This is a routine physical examination for this healthy  Female. Reviewed all health maintenance protocols including mammography colonoscopy bone density and reviewed appropriate screening labs. Her immunization history was reviewed as well as her current medications and allergies refills of her chronic medications were given and the plan for yearly health maintenance was discussed all orders and referrals were made as appropriate.   Facial tic? Differential includes PAD. Carotid study. B12. Consider tegretol if studies negative Check sedimentation rate to make sure that this does not represent a type of arteritis asthma and HTN stable lipids are at goal Increased overactive bladder symptoms so we will titrate the detrol to 4 mg

## 2012-06-29 ENCOUNTER — Encounter: Payer: Self-pay | Admitting: Internal Medicine

## 2012-06-30 NOTE — Progress Notes (Signed)
  Subjective:    Patient ID: Tara Cruz, female    DOB: 02/06/1951, 62 y.o.   MRN: [id number]  HPI  Duplicate chart due to move  Review of Systems     Objective:   Physical Exam        Assessment & Plan:

## 2012-07-03 ENCOUNTER — Encounter (INDEPENDENT_AMBULATORY_CARE_PROVIDER_SITE_OTHER): Payer: 59

## 2012-07-03 DIAGNOSIS — R55 Syncope and collapse: Secondary | ICD-10-CM

## 2012-07-03 DIAGNOSIS — R0989 Other specified symptoms and signs involving the circulatory and respiratory systems: Secondary | ICD-10-CM

## 2012-07-03 DIAGNOSIS — F959 Tic disorder, unspecified: Secondary | ICD-10-CM

## 2012-07-03 DIAGNOSIS — I6529 Occlusion and stenosis of unspecified carotid artery: Secondary | ICD-10-CM

## 2012-07-10 ENCOUNTER — Encounter: Payer: Self-pay | Admitting: Internal Medicine

## 2012-07-10 DIAGNOSIS — T148XXA Other injury of unspecified body region, initial encounter: Secondary | ICD-10-CM

## 2012-08-16 ENCOUNTER — Encounter: Payer: Self-pay | Admitting: Internal Medicine

## 2012-09-14 ENCOUNTER — Telehealth: Payer: Self-pay | Admitting: Internal Medicine

## 2012-09-14 NOTE — Telephone Encounter (Signed)
Patient Information:  Caller Name: Davelyn  Phone: [phone]  Patient: Tara Cruz, Tara Cruz  Gender: Female  DOB: 1950/09/30  Age: 62 Years  PCP: Darryll Capers (Adults only)  Office Follow Up:  Does the office need to follow up with this patient?: Yes  Instructions For The Office: Caller declined the only available appointment with Lubertha Sayres due to dental appointment.  She then states she would like callback from Dr. Lovell Sheehan regarding what steps to take next.  Advised her he is not in the office today. She responded she would like a call back at his earliest convenience.   Symptoms  Reason For Call & Symptoms: Caller relates she has had ongoing problems since November with creepy crawly sensations on right side of head.  Symptoms seem worse.  Neck and ear pain onset 09/13/12.  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  Pain rated at 8 of 10 behind right ear.  Patient is at work.  She asks to be seen.  Reviewed Health History In EMR: Yes  Reviewed Medications In EMR: Yes  Reviewed Allergies In EMR: Yes  Reviewed Surgeries / Procedures: Yes  Date of Onset of Symptoms: Unknown  Treatments Tried: Aleve for pain  Treatments Tried Worked: No  Guideline(s) Used:  Neck Pain or Stiffness  Disposition Per Guideline:   See Today in Office  Reason For Disposition Reached:   Patient wants to be seen  Advice Given:  Apply Cold to the Area Or Heat:   During the first 2 days after a mild injury, apply a cold pack or an ice bag (wrapped in a towel) for 20 minutes four times a day. After 2 days, apply a heating pad or hot water bottle to the most painful area for 20 minutes whenever the pain flares up. Wrap hot water bottles or heating pads in a towel to avoid burns.  Stretching Exercises:  Improve the tone of the neck muscles with 2 or 3 minutes of gentle stretching exercises per day such as touching the chin to each shoulder, touching the ear to each shoulder, and moving the head  forward and backward.  Call Back If:  Numbness or weakness occurs  Bowel or bladder problems occur  You become worse.  Patient Will Follow Care Advice:  YES

## 2012-09-18 ENCOUNTER — Encounter: Payer: Self-pay | Admitting: *Deleted

## 2012-09-18 ENCOUNTER — Other Ambulatory Visit: Payer: Self-pay | Admitting: *Deleted

## 2012-09-18 DIAGNOSIS — R200 Anesthesia of skin: Secondary | ICD-10-CM

## 2012-09-18 DIAGNOSIS — M542 Cervicalgia: Secondary | ICD-10-CM

## 2012-09-18 NOTE — Telephone Encounter (Signed)
Pt is requesting a referral to dr Lorin Picket per dr Lovell Sheehan

## 2012-10-20 ENCOUNTER — Encounter: Payer: Self-pay | Admitting: Internal Medicine

## 2012-12-26 ENCOUNTER — Ambulatory Visit (INDEPENDENT_AMBULATORY_CARE_PROVIDER_SITE_OTHER): Payer: 59 | Admitting: Internal Medicine

## 2012-12-26 ENCOUNTER — Encounter: Payer: Self-pay | Admitting: Internal Medicine

## 2012-12-26 VITALS — BP 170/96 | HR 67 | Temp 98.2°F | Resp 20 | Wt 165.0 lb

## 2012-12-26 DIAGNOSIS — I10 Essential (primary) hypertension: Secondary | ICD-10-CM

## 2012-12-26 DIAGNOSIS — R079 Chest pain, unspecified: Secondary | ICD-10-CM

## 2012-12-26 DIAGNOSIS — F17200 Nicotine dependence, unspecified, uncomplicated: Secondary | ICD-10-CM

## 2012-12-26 DIAGNOSIS — E785 Hyperlipidemia, unspecified: Secondary | ICD-10-CM

## 2012-12-26 MED ORDER — METOPROLOL SUCCINATE ER 50 MG PO TB24: 50.0000 mg | ORAL_TABLET | Freq: Every day | ORAL | Status: DC

## 2012-12-26 MED ORDER — NITROGLYCERIN 0.4 MG SL SUBL: 0.4000 mg | SUBLINGUAL_TABLET | SUBLINGUAL | Status: DC | PRN

## 2012-12-26 NOTE — Patient Instructions (Addendum)
Aspirin 81 mg daily  Smoking tobacco is very bad for your health. You should stop smoking immediately.  Report to the emergency department immediately if you have any recurrent pain  Stress test as discussed  Metoprolol 1 daily  Use nitroglycerin if any recurrent chest pain

## 2012-12-26 NOTE — Progress Notes (Signed)
Subjective:    Patient ID: Tara Cruz, female    DOB: [birth year], 62 y.o.   MRN: [id number]  HPI  62 year old patient with multiple cardiac risk factors who was stable until Sunday at approximately 7:30 PM when she experienced an episode of significant bilateral jaw pain. Pain was also present in the shoulder and across the upper anterior chest. Pain lasted 3-5 minutes and was associated with an episode of diaphoresis. No nausea or shortness of breath. Chest Denies any history of exertional chest pain Cardiac risk factors include hypertension dyslipidemia and ongoing tobacco use. She also has a family history of heart disease. Grandfather died at 45 of cardiac disease. Father and sister status post pacemaker insertion.  For the past 2 days she has felt a bit tired. She has had no further chest pain or jaw pain. After her episode of pain she took a single aspirin tablet.  Nuclear stress test 2009 normal  Past Medical History  Diagnosis Date  . Allergy   . Hyperlipidemia   . Asthma   . COPD (chronic obstructive pulmonary disease)   . Hypertension     History   Social History  . Marital Status: Married    Spouse Name: N/A    Number of Children: N/A  . Years of Education: N/A   Occupational History  . Not on file.   Social History Main Topics  . Smoking status: Current Every Day Smoker  . Smokeless tobacco: Not on file  . Alcohol Use: No  . Drug Use: No  . Sexually Active: Yes   Other Topics Concern  . Not on file   Social History Narrative  . No narrative on file    Past Surgical History  Procedure Laterality Date  . Abdominal hysterectomy    . Foot surgery      Family History  Problem Relation Age of Onset  . Coronary artery disease Mother   . Alzheimer's disease Mother   . Coronary artery disease Father   . Breast cancer Sister     No Known Allergies  Current Outpatient Prescriptions on File Prior to Visit  Medication Sig Dispense Refill  .  aspirin 81 MG tablet Take 81 mg by mouth daily.        . CELEBREX 200 MG capsule TAKE 1 CAPSULE TWICE DAILY  180 capsule  2  . cetirizine (ZYRTEC) 10 MG tablet Take 1 tablet (10 mg total) by mouth daily.  90 tablet  3  . estradiol (ESTRACE) 0.5 MG tablet TAKE 1 TABLET DAILY  90 tablet  2  . fluticasone (FLONASE) 50 MCG/ACT nasal spray Place 2 sprays into the nose daily.  48 g  3  . Fluticasone-Salmeterol (ADVAIR DISKUS) 250-50 MCG/DOSE AEPB Inhale 1 puff into the lungs every 12 (twelve) hours.  3 each  3  . furosemide (LASIX) 20 MG tablet Take 1 tablet (20 mg total) by mouth daily.  90 tablet  3  . mupirocin (BACTROBAN) 2 % ointment Apply topically 3 (three) times daily.        Marland Kitchen tolterodine (DETROL LA) 4 MG 24 hr capsule Take 1 capsule (4 mg total) by mouth daily.  90 capsule  2  . VYTORIN 10-20 MG per tablet TAKE 1 TABLET DAILY  90 tablet  2  . XOPENEX HFA 45 MCG/ACT inhaler INHALE 2 PUFFS EVERY 4 HOURS AS NEEDED  3 Inhaler  3  . zolpidem (AMBIEN CR) 12.5 MG CR tablet Take 1 tablet (12.5 mg total) by  mouth at bedtime as needed for sleep.  30 tablet  3  . [DISCONTINUED] DETROL LA 2 MG 24 hr capsule TAKE 1 CAPSULE DAILY  90 capsule  2   No current facility-administered medications on file prior to visit.    BP 170/96  Pulse 67  Temp(Src) 98.2 F (36.8 C) (Oral)  Resp 20  Wt 165 lb (74.844 kg)  BMI 26.24 kg/m2  SpO2 95%       Review of Systems  Constitutional: Negative.   HENT: Negative for hearing loss, congestion, sore throat, rhinorrhea, dental problem, sinus pressure and tinnitus.   Eyes: Negative for pain, discharge and visual disturbance.  Respiratory: Negative for cough and shortness of breath.   Cardiovascular: Positive for chest pain. Negative for palpitations and leg swelling.  Gastrointestinal: Negative for nausea, vomiting, abdominal pain, diarrhea, constipation, blood in stool and abdominal distention.  Genitourinary: Negative for dysuria, urgency, frequency,  hematuria, flank pain, vaginal bleeding, vaginal discharge, difficulty urinating, vaginal pain and pelvic pain.  Musculoskeletal: Negative for joint swelling, arthralgias and gait problem.  Skin: Negative for rash.  Neurological: Negative for dizziness, syncope, speech difficulty, weakness, numbness and headaches.  Hematological: Negative for adenopathy.  Psychiatric/Behavioral: Negative for behavioral problems, dysphoric mood and agitation. The patient is not nervous/anxious.        Objective:   Physical Exam  Constitutional: She is oriented to person, place, and time. She appears well-developed and well-nourished.  Repeat blood pressure 160/84  HENT:  Head: Normocephalic.  Right Ear: External ear normal.  Left Ear: External ear normal.  Mouth/Throat: Oropharynx is clear and moist.  Eyes: Conjunctivae and EOM are normal. Pupils are equal, round, and reactive to light.  Neck: Normal range of motion. Neck supple. No thyromegaly present.  No bruits  Cardiovascular: Normal rate, regular rhythm, normal heart sounds and intact distal pulses.   Pedal pulses full  Pulmonary/Chest: Effort normal and breath sounds normal.  Abdominal: Soft. Bowel sounds are normal. She exhibits no mass. There is no tenderness.  Musculoskeletal: Normal range of motion.  Lymphadenopathy:    She has no cervical adenopathy.  Neurological: She is alert and oriented to person, place, and time.  Skin: Skin is warm and dry. No rash noted.  Psychiatric: She has a normal mood and affect. Her behavior is normal.          Assessment & Plan:   Symptom complex bothersome for ACS in a patient with multiple cardiac risk factors. We'll check a 12-lead EKG as well as a troponin.  If these are negative will schedule a nuclear stress test Hypertension Dyslipidemia Ongoing tobacco use  Total smoking cessation encouraged Will place on daily aspirin 81 mg daily A new  prescription for when necessary nitroglycerin  dispensed;  We'll place on metoprolol 50 mg daily  Refer to the ED if any recurrent pain

## 2012-12-27 LAB — TROPONIN I: Troponin I: 0.01 ng/mL (ref <0.06)

## 2013-01-02 ENCOUNTER — Other Ambulatory Visit: Payer: Self-pay | Admitting: Internal Medicine

## 2013-01-03 ENCOUNTER — Ambulatory Visit (HOSPITAL_COMMUNITY): Payer: 59 | Attending: Cardiology | Admitting: Radiology

## 2013-01-03 VITALS — BP 158/75 | Ht 66.5 in | Wt 157.0 lb

## 2013-01-03 DIAGNOSIS — F17200 Nicotine dependence, unspecified, uncomplicated: Secondary | ICD-10-CM | POA: Insufficient documentation

## 2013-01-03 DIAGNOSIS — R0989 Other specified symptoms and signs involving the circulatory and respiratory systems: Secondary | ICD-10-CM | POA: Insufficient documentation

## 2013-01-03 DIAGNOSIS — R002 Palpitations: Secondary | ICD-10-CM | POA: Insufficient documentation

## 2013-01-03 DIAGNOSIS — I10 Essential (primary) hypertension: Secondary | ICD-10-CM | POA: Insufficient documentation

## 2013-01-03 DIAGNOSIS — R0609 Other forms of dyspnea: Secondary | ICD-10-CM | POA: Insufficient documentation

## 2013-01-03 DIAGNOSIS — R5383 Other fatigue: Secondary | ICD-10-CM | POA: Insufficient documentation

## 2013-01-03 DIAGNOSIS — R079 Chest pain, unspecified: Secondary | ICD-10-CM | POA: Insufficient documentation

## 2013-01-03 DIAGNOSIS — R0602 Shortness of breath: Secondary | ICD-10-CM

## 2013-01-03 DIAGNOSIS — R5381 Other malaise: Secondary | ICD-10-CM | POA: Insufficient documentation

## 2013-01-03 DIAGNOSIS — R9439 Abnormal result of other cardiovascular function study: Secondary | ICD-10-CM

## 2013-01-03 DIAGNOSIS — I6529 Occlusion and stenosis of unspecified carotid artery: Secondary | ICD-10-CM | POA: Insufficient documentation

## 2013-01-03 DIAGNOSIS — E785 Hyperlipidemia, unspecified: Secondary | ICD-10-CM

## 2013-01-03 DIAGNOSIS — R42 Dizziness and giddiness: Secondary | ICD-10-CM | POA: Insufficient documentation

## 2013-01-03 HISTORY — DX: Abnormal result of other cardiovascular function study: R94.39

## 2013-01-03 MED ORDER — TECHNETIUM TC 99M SESTAMIBI GENERIC - CARDIOLITE
33.0000 | Freq: Once | INTRAVENOUS | Status: AC | PRN
  Administered 2013-01-03: 33 via INTRAVENOUS

## 2013-01-03 MED ORDER — REGADENOSON 0.4 MG/5ML IV SOLN
0.4000 mg | Freq: Once | INTRAVENOUS | Status: AC
  Administered 2013-01-03: 0.4 mg via INTRAVENOUS

## 2013-01-03 MED ORDER — TECHNETIUM TC 99M SESTAMIBI GENERIC - CARDIOLITE
11.0000 | Freq: Once | INTRAVENOUS | Status: AC | PRN
  Administered 2013-01-03: 11 via INTRAVENOUS

## 2013-01-03 NOTE — Progress Notes (Signed)
[facility] SITE 3 NUCLEAR MED [address] [place], [place] [address] [phone]    Cardiology Nuclear Med Study  Tara Cruz is a 62 y.o. female     MRN : [id number]     DOB: 01/24/1951  Procedure Date: 01/03/2013  Nuclear Med Background Indication for Stress Test:  Evaluation for Ischemia History:  2009 MPS Normal EF 54%, 2001 Heart Catheterization Normal EF 50-55% Cardiac Risk Factors: Carotid Disease, Family History - CAD, Hypertension, Lipids and Smoker  Symptoms:  Chest Pain, Diaphoresis, Dizziness, DOE, Fatigue, Light-Headedness and Palpitations   Nuclear Pre-Procedure Caffeine/Decaff Intake:  None > 12 hrs NPO After: 8:30am   Lungs:  clear O2 Sat: 95% on room air. IV 0.9% NS with Angio Cath:  22g  IV Site: R Antecubital x 1, tolerated well IV Started by:  Irean Hong, RN  Chest Size (in):  42 Cup Size: C  Height: 5' 6.5" (1.689 m)  Weight:  157 lb (71.215 kg)  BMI:  Body mass index is 24.96 kg/(m^2). Tech Comments:  Held Toprol x 36 hrs    Nuclear Med Study 1 or 2 day study: 1 day  Stress Test Type:  Lexiscan  Reading MD: Marca Ancona, MD  Order Authorizing Provider:  Eleonore Chiquito, MD  Resting Radionuclide: Technetium 39m Sestamibi  Resting Radionuclide Dose: 10.7 mCi   Stress Radionuclide:  Technetium 2m Sestamibi  Stress Radionuclide Dose: 33.0 mCi           Stress Protocol Rest HR: 52 Stress HR: 91  Rest BP: 158/75 Stress BP: 154/109  Exercise Time (min): 1:49 METS: 1.6   Predicted Max HR: 156 bpm % Max HR: 58.33 bpm Rate Pressure Product: 21308   Dose of Adenosine (mg):  n/a Dose of Lexiscan: 0.4 mg  Dose of Atropine (mg): n/a Dose of Dobutamine: n/a mcg/kg/min (at max HR)  Stress Test Technologist: Bonnita Levan, RN  Nuclear Technologist:  Harlow Asa, CNMT     Rest Procedure:  Myocardial perfusion imaging was performed at rest 45 minutes following the intravenous administration of Technetium 60m Sestamibi. Rest ECG:  NSR-LBBB and NSR-RBBB  Stress Procedure:  The patient received IV Lexiscan 0.4 mg over 15-seconds with concurrent low level exercise and then Technetium 75m Sestamibi was injected at 30-seconds while the patient continued walking one more minute.  Quantitative spect images were obtained after a 45-minute delay. Stress ECG: No significant change from baseline ECG  QPS Raw Data Images:  Normal; no motion artifact; normal heart/lung ratio. Stress Images:  Small, mild basal to mid inferior perfusion defect.  Small, mild apical anteroseptal perfusion defect.  Rest Images:  Small, mild basal to mid inferior perfusion defect.   Subtraction (SDS):  Fixed small, mild basal to mid inferior perfusion defect.  Reversible small, mild apical anteroseptal perfusion defect.  Transient Ischemic Dilatation (Normal <1.22):  n/ Lung/Heart Ratio (Normal <0.45):  0.45  Quantitative Gated Spect Images QGS EDV:  82 ml QGS ESV:  35 ml  Impression Exercise Capacity:  Lexiscan with low level exercise. BP Response:  Normal blood pressure response. Clinical Symptoms:  Mild chest pain. ECG Impression:  RBBB, no ischemic changes.  Comparison with Prior Nuclear Study: No images to compare  Overall Impression:  Low to intermediate risk stress nuclear study with a fixed basal to mid inferior perfusion defect but a reversible apical anteroseptal perfusion defect.  The fixed defect probably represents soft tissue attenuation given normal wall motion, but cannot rule out ischemia with the apical  anteroseptal reversible defect, though it is small. .  LV Ejection Fraction: 58%.  LV Wall Motion:  NL LV Function; NL Wall Motion  Marca Ancona 01/03/2013

## 2013-01-15 ENCOUNTER — Other Ambulatory Visit: Payer: Self-pay | Admitting: *Deleted

## 2013-01-15 ENCOUNTER — Other Ambulatory Visit: Payer: Self-pay | Admitting: Internal Medicine

## 2013-01-15 DIAGNOSIS — R931 Abnormal findings on diagnostic imaging of heart and coronary circulation: Secondary | ICD-10-CM

## 2013-01-17 ENCOUNTER — Other Ambulatory Visit: Payer: Self-pay | Admitting: *Deleted

## 2013-01-17 MED ORDER — FLUTICASONE-SALMETEROL 250-50 MCG/DOSE IN AEPB: 1.0000 | INHALATION_SPRAY | Freq: Two times a day (BID) | RESPIRATORY_TRACT | Status: DC

## 2013-01-17 MED ORDER — FLUTICASONE PROPIONATE 50 MCG/ACT NA SUSP: 2.0000 | Freq: Every day | NASAL | Status: DC

## 2013-01-17 MED ORDER — LEVALBUTEROL TARTRATE 45 MCG/ACT IN AERO: INHALATION_SPRAY | RESPIRATORY_TRACT | Status: DC

## 2013-01-19 ENCOUNTER — Ambulatory Visit (INDEPENDENT_AMBULATORY_CARE_PROVIDER_SITE_OTHER): Payer: 59 | Admitting: Cardiovascular Disease

## 2013-01-19 ENCOUNTER — Encounter: Payer: Self-pay | Admitting: Cardiovascular Disease

## 2013-01-19 ENCOUNTER — Other Ambulatory Visit: Payer: Self-pay | Admitting: *Deleted

## 2013-01-19 VITALS — BP 146/80 | HR 53 | Ht 66.5 in | Wt 163.2 lb

## 2013-01-19 DIAGNOSIS — D689 Coagulation defect, unspecified: Secondary | ICD-10-CM

## 2013-01-19 DIAGNOSIS — R0789 Other chest pain: Secondary | ICD-10-CM

## 2013-01-19 DIAGNOSIS — I10 Essential (primary) hypertension: Secondary | ICD-10-CM

## 2013-01-19 DIAGNOSIS — I4510 Unspecified right bundle-branch block: Secondary | ICD-10-CM | POA: Insufficient documentation

## 2013-01-19 DIAGNOSIS — Z01818 Encounter for other preprocedural examination: Secondary | ICD-10-CM

## 2013-01-19 NOTE — Patient Instructions (Signed)
Dr Allyson Sabal has ordered a cardiac catheterization via the right radial artery.   Your physician has requested that you have a cardiac catheterization. Cardiac catheterization is used to diagnose and/or treat various heart conditions. Doctors may recommend this procedure for a number of different reasons. The most common reason is to evaluate chest pain. Chest pain can be a symptom of coronary artery disease (CAD), and cardiac catheterization can show whether plaque is narrowing or blocking your heart's arteries. This procedure is also used to evaluate the valves, as well as measure the blood flow and oxygen levels in different parts of your heart. For further information please visit [email or web address] Please follow instruction sheet, as given.

## 2013-01-19 NOTE — Assessment & Plan Note (Signed)
Patient had an episode of bilateral jaw pain with chest discomfort associated with diaphoresis approximately 3 weeks ago. A subsequent Myoview stress test showed diaphragmatic attenuation as well as apical ischemia. Based on her risk factors of 50-pack-years of tobacco abuse, hypertension and hyperlipidemia along with her symptoms and positive stress test I suggested that she proceed with outpatient diagnostic coronary arteriography via the right radial approach. She has had a heart catheterization done approximately 10-12 years ago that was apparently normal.

## 2013-01-19 NOTE — Progress Notes (Signed)
01/19/2013 Tara Cruz   1951/03/07  [id number]  Primary Physician Tara Mew, MD Primary Cardiologist: Tara Gess MD Tara Cruz   HPI:  Tara Cruz is a 62 year old engaged Caucasian female mother of 2, grandmother and 7 grandchildren he works as an Research scientist (medical) at [employer]. She was referred by Tara Cruz for cardiovascular evaluation because of chest pain, positive cardiac risk factors, and a positive Myoview stress test. She has 50 pack years history of tobacco abuse smoking one pack per day up until recently now smoking one half pack per day. Other problems include hypertension and hyperlipidemia. She also has COPD. There is no family history of heart disease. She has never had a heart attack or stroke. She barely had a heart catheterization performed 10-12 years ago which was normal. Approximately 3 weeks ago she developed bilateral jaw pain associated with chest pain and diaphoresis lasting 2-3 minutes. She saw Tara Cruz who ordered a stress test that showed apical ischemia.   Current Outpatient Prescriptions  Medication Sig Dispense Refill  . aspirin 81 MG tablet Take 81 mg by mouth daily.        . CELEBREX 200 MG capsule TAKE 1 CAPSULE TWICE DAILY  180 capsule  2  . cetirizine (ZYRTEC) 10 MG tablet Take 1 tablet (10 mg total) by mouth daily.  90 tablet  3  . estradiol (ESTRACE) 0.5 MG tablet TAKE 1 TABLET DAILY  90 tablet  3  . fluticasone (FLONASE) 50 MCG/ACT nasal spray Place 2 sprays into the nose daily.  48 g  3  . Fluticasone-Salmeterol (ADVAIR DISKUS) 250-50 MCG/DOSE AEPB Inhale 1 puff into the lungs every 12 (twelve) hours.  3 each  3  . furosemide (LASIX) 20 MG tablet Take 1 tablet (20 mg total) by mouth daily.  90 tablet  3  . gabapentin (NEURONTIN) 300 MG capsule Take 600 mg by mouth at bedtime.       . levalbuterol (XOPENEX HFA) 45 MCG/ACT inhaler INHALE 2 PUFFS EVERY 4 HOURS AS NEEDED  3 Inhaler  3  . lidocaine  (XYLOCAINE) 5 % ointment Apply 1 application topically as needed.       . metoprolol succinate (TOPROL-XL) 50 MG 24 hr tablet Take 1 tablet (50 mg total) by mouth daily. Take with or immediately following a meal.  90 tablet  3  . mupirocin (BACTROBAN) 2 % ointment Apply topically 3 (three) times daily.        . nitroGLYCERIN (NITROSTAT) 0.4 MG SL tablet Place 1 tablet (0.4 mg total) under the tongue every 5 (five) minutes as needed for chest pain.  50 tablet  3  . RESTASIS 0.05 % ophthalmic emulsion Place 1 drop into both eyes every 12 (twelve) hours.       Marland Kitchen tolterodine (DETROL LA) 4 MG 24 hr capsule Take 1 capsule (4 mg total) by mouth daily.  90 capsule  2  . VYTORIN 10-20 MG per tablet TAKE 1 TABLET DAILY  90 tablet  3  . zolpidem (AMBIEN CR) 12.5 MG CR tablet Take 1 tablet (12.5 mg total) by mouth at bedtime as needed for sleep.  30 tablet  3  . [DISCONTINUED] DETROL LA 2 MG 24 hr capsule TAKE 1 CAPSULE DAILY  90 capsule  2   No current facility-administered medications for this visit.    No Known Allergies  History   Social History  . Marital Status: Married    Spouse Name: N/A  Number of Children: N/A  . Years of Education: N/A   Occupational History  . Not on file.   Social History Main Topics  . Smoking status: Current Every Day Smoker  . Smokeless tobacco: Not on file  . Alcohol Use: No  . Drug Use: No  . Sexual Activity: Yes   Other Topics Concern  . Not on file   Social History Narrative  . No narrative on file     Review of Systems: General: negative for chills, fever, night sweats or weight changes.  Cardiovascular: negative for chest pain, dyspnea on exertion, edema, orthopnea, palpitations, paroxysmal nocturnal dyspnea or shortness of breath Dermatological: negative for rash Respiratory: negative for cough or wheezing Urologic: negative for hematuria Abdominal: negative for nausea, vomiting, diarrhea, bright red blood per rectum, melena, or  hematemesis Neurologic: negative for visual changes, syncope, or dizziness All other systems reviewed and are otherwise negative except as noted above.    Blood pressure 146/80, pulse 53, height 5' 6.5" (1.689 m), weight 163 lb 3.2 oz (74.027 kg).  General appearance: alert and no distress Neck: no adenopathy, no carotid bruit, no JVD, supple, symmetrical, trachea midline and thyroid not enlarged, symmetric, no tenderness/mass/nodules Lungs: clear to auscultation bilaterally Heart: regular rate and rhythm, S1, S2 normal, no murmur, click, rub or gallop Abdomen: soft, non-tender; bowel sounds normal; no masses,  no organomegaly Extremities: extremities normal, atraumatic, no cyanosis or edema Pulses: 2+ and symmetric  EKG sinus bradycardia at 53 with a right bundle branch block and left anterior fascicular block  ASSESSMENT AND PLAN:   CHEST PAIN, ATYPICAL Patient had an episode of bilateral jaw pain with chest discomfort associated with diaphoresis approximately 3 weeks ago. A subsequent Myoview stress test showed diaphragmatic attenuation as well as apical ischemia. Based on her risk factors of 50-pack-years of tobacco abuse, hypertension and hyperlipidemia along with her symptoms and positive stress test I suggested that she proceed with outpatient diagnostic coronary arteriography via the right radial approach. She has had a heart catheterization done approximately 10-12 years ago that was apparently normal.      Tara Gess MD [facility], [facility] 01/19/2013 10:21 AM

## 2013-01-23 LAB — BASIC METABOLIC PANEL
BUN: 25 mg/dL — ABNORMAL HIGH (ref 6–23)
CO2: 28 mEq/L (ref 19–32)
Calcium: 9.6 mg/dL (ref 8.4–10.5)
Chloride: 107 mEq/L (ref 96–112)
Creat: 1.02 mg/dL (ref 0.50–1.10)
Glucose, Bld: 89 mg/dL (ref 70–99)

## 2013-01-23 LAB — APTT: aPTT: 28 seconds (ref 24–37)

## 2013-01-23 LAB — CBC
MCH: 31.2 pg (ref 26.0–34.0)
MCHC: 33.2 g/dL (ref 30.0–36.0)
Platelets: 251 10*3/uL (ref 150–400)
RDW: 13.8 % (ref 11.5–15.5)

## 2013-01-23 LAB — PROTIME-INR: Prothrombin Time: 12.1 seconds (ref 11.6–15.2)

## 2013-01-24 ENCOUNTER — Encounter (HOSPITAL_COMMUNITY): Payer: Self-pay | Admitting: Pharmacy Technician

## 2013-01-25 ENCOUNTER — Encounter: Payer: Self-pay | Admitting: *Deleted

## 2013-01-26 ENCOUNTER — Ambulatory Visit: Payer: 59 | Admitting: Internal Medicine

## 2013-01-30 ENCOUNTER — Ambulatory Visit (HOSPITAL_COMMUNITY)
Admission: RE | Admit: 2013-01-30 | Discharge: 2013-01-30 | Disposition: A | Discharge status: Home / Self Care | Payer: 59 | Source: Ambulatory Visit | Attending: Cardiovascular Disease | Admitting: Cardiovascular Disease

## 2013-01-30 ENCOUNTER — Encounter (HOSPITAL_COMMUNITY)
Admission: RE | Disposition: A | Discharge status: Home / Self Care | Payer: Self-pay | Source: Ambulatory Visit | Attending: Cardiovascular Disease

## 2013-01-30 DIAGNOSIS — F17200 Nicotine dependence, unspecified, uncomplicated: Secondary | ICD-10-CM | POA: Insufficient documentation

## 2013-01-30 DIAGNOSIS — Z7982 Long term (current) use of aspirin: Secondary | ICD-10-CM | POA: Insufficient documentation

## 2013-01-30 DIAGNOSIS — I2510 Atherosclerotic heart disease of native coronary artery without angina pectoris: Secondary | ICD-10-CM | POA: Insufficient documentation

## 2013-01-30 DIAGNOSIS — R079 Chest pain, unspecified: Secondary | ICD-10-CM | POA: Insufficient documentation

## 2013-01-30 DIAGNOSIS — Z01818 Encounter for other preprocedural examination: Secondary | ICD-10-CM

## 2013-01-30 DIAGNOSIS — I10 Essential (primary) hypertension: Secondary | ICD-10-CM | POA: Insufficient documentation

## 2013-01-30 DIAGNOSIS — Z79899 Other long term (current) drug therapy: Secondary | ICD-10-CM | POA: Insufficient documentation

## 2013-01-30 DIAGNOSIS — R0789 Other chest pain: Secondary | ICD-10-CM

## 2013-01-30 DIAGNOSIS — E785 Hyperlipidemia, unspecified: Secondary | ICD-10-CM | POA: Insufficient documentation

## 2013-01-30 DIAGNOSIS — R9439 Abnormal result of other cardiovascular function study: Secondary | ICD-10-CM | POA: Insufficient documentation

## 2013-01-30 DIAGNOSIS — IMO0002 Reserved for concepts with insufficient information to code with codable children: Secondary | ICD-10-CM | POA: Insufficient documentation

## 2013-01-30 HISTORY — PX: LEFT HEART CATHETERIZATION WITH CORONARY ANGIOGRAM: SHX5451

## 2013-01-30 HISTORY — PX: CARDIAC CATHETERIZATION: SHX172

## 2013-01-30 HISTORY — DX: Atherosclerotic heart disease of native coronary artery without angina pectoris: I25.10

## 2013-01-30 SURGERY — LEFT HEART CATHETERIZATION WITH CORONARY ANGIOGRAM
Anesthesia: LOCAL

## 2013-01-30 MED ORDER — HEPARIN SODIUM (PORCINE) 1000 UNIT/ML IJ SOLN
INTRAMUSCULAR | Status: AC
  Filled 2013-01-30: qty 1

## 2013-01-30 MED ORDER — ACETAMINOPHEN 325 MG PO TABS: 650.0000 mg | ORAL_TABLET | ORAL | Status: DC | PRN

## 2013-01-30 MED ORDER — ASPIRIN 81 MG PO CHEW
CHEWABLE_TABLET | ORAL | Status: AC
  Filled 2013-01-30: qty 4

## 2013-01-30 MED ORDER — SODIUM CHLORIDE 0.9 % IJ SOLN: 3.0000 mL | INTRAMUSCULAR | Status: DC | PRN

## 2013-01-30 MED ORDER — ASPIRIN 81 MG PO CHEW: 81.0000 mg | CHEWABLE_TABLET | Freq: Every day | ORAL | Status: DC

## 2013-01-30 MED ORDER — VERAPAMIL HCL 2.5 MG/ML IV SOLN
INTRAVENOUS | Status: AC
  Filled 2013-01-30: qty 2

## 2013-01-30 MED ORDER — NITROGLYCERIN 0.2 MG/ML ON CALL CATH LAB
INTRAVENOUS | Status: AC
  Filled 2013-01-30: qty 1

## 2013-01-30 MED ORDER — ASPIRIN 81 MG PO CHEW
324.0000 mg | CHEWABLE_TABLET | ORAL | Status: AC
  Administered 2013-01-30: 324 mg via ORAL

## 2013-01-30 MED ORDER — FENTANYL CITRATE 0.05 MG/ML IJ SOLN
INTRAMUSCULAR | Status: AC
  Filled 2013-01-30: qty 2

## 2013-01-30 MED ORDER — MIDAZOLAM HCL 2 MG/2ML IJ SOLN
INTRAMUSCULAR | Status: AC
  Filled 2013-01-30: qty 2

## 2013-01-30 MED ORDER — DIAZEPAM 5 MG PO TABS
ORAL_TABLET | ORAL | Status: AC
  Filled 2013-01-30: qty 1

## 2013-01-30 MED ORDER — SODIUM CHLORIDE 0.9 % IV SOLN
INTRAVENOUS | Status: DC
  Administered 2013-01-30: 06:00:00 via INTRAVENOUS

## 2013-01-30 MED ORDER — LIDOCAINE HCL (PF) 1 % IJ SOLN
INTRAMUSCULAR | Status: AC
  Filled 2013-01-30: qty 30

## 2013-01-30 MED ORDER — SODIUM CHLORIDE 0.9 % IV SOLN: INTRAVENOUS | Status: DC

## 2013-01-30 MED ORDER — MORPHINE SULFATE 2 MG/ML IJ SOLN: 1.0000 mg | INTRAMUSCULAR | Status: DC | PRN

## 2013-01-30 MED ORDER — ONDANSETRON HCL 4 MG/2ML IJ SOLN: 4.0000 mg | Freq: Four times a day (QID) | INTRAMUSCULAR | Status: DC | PRN

## 2013-01-30 MED ORDER — HEPARIN (PORCINE) IN NACL 2-0.9 UNIT/ML-% IJ SOLN
INTRAMUSCULAR | Status: AC
  Filled 2013-01-30: qty 1500

## 2013-01-30 MED ORDER — DIAZEPAM 5 MG PO TABS
5.0000 mg | ORAL_TABLET | ORAL | Status: AC
  Administered 2013-01-30: 5 mg via ORAL

## 2013-01-30 NOTE — H&P (Signed)
    Pt was reexamined and existing H & P reviewed. No changes found.  Runell Gess, MD [facility] 01/30/2013 7:51 AM

## 2013-01-30 NOTE — CV Procedure (Signed)
Tara Cruz is a 62 y.o. female    [id number] LOCATION:  FACILITY: [facility]  PHYSICIAN: Nanetta Batty, M.D. 1950-09-20   DATE OF PROCEDURE:  01/30/2013  DATE OF DISCHARGE:   CARDIAC CATHETERIZATION     History obtained from chart review.Tara Cruz is a 62 year old engaged Caucasian female mother of 2, grandmother and 7 grandchildren he works as an Research scientist (medical) at [employer]. She was referred by Dr. Darryll Capers for cardiovascular evaluation because of chest pain, positive cardiac risk factors, and a positive Myoview stress test. She has 50 pack years history of tobacco abuse smoking one pack per day up until recently now smoking one half pack per day. Other problems include hypertension and hyperlipidemia. She also has COPD. There is no family history of heart disease. She has never had a heart attack or stroke. She apparently had a heart catheterization performed 10-12 years ago which was normal. Approximately 3 weeks ago she developed bilateral jaw pain associated with chest pain and diaphoresis lasting 2-3 minutes. She saw Dr. Amador Cunas who ordered a stress test that showed apical ischemia.    PROCEDURE DESCRIPTION:    The patient was brought to the second floor [facility] cath lab in the postabsorptive state. She was premedicated with Valium 5 mg by mouth, IV Versed and fentanyl. Her right wristwas prepped and shaved in usual sterile fashion. Xylocaine 1% was used for local anesthesia. A 5 French sheath was inserted into the right radial artery using standard Seldinger technique. The patient received 4000 units  of heparin  intravenously.  A 5 [place] TIG catheter, right Judkins catheter and pigtail catheters were used for selective coronary angiography and left ventriculography respectively. Visipaque dye was used for the entirety of the case. Retrograde aorta, left ventricular end pullback pressures were recorded. A total of 70 cc of contrast was administered to the  patient    HEMODYNAMICS:    AO SYSTOLIC/AO DIASTOLIC: 116/63   LV SYSTOLIC/LV DIASTOLIC: 119/18  ANGIOGRAPHIC RESULTS:   1. Left main; normal  2. LAD; the epicardial LAD was free of significant disease. The second small diagonal branch had a 95% stenosis though this was too small to intervene on 3. Left circumflex; nondominant and free of significant disease.  4. Right coronary artery; dominant and free of significant disease 5. Left ventriculography; RAO left ventriculogram was performed using  25 mL of Visipaque dye at 12 mL/second. The overall LVEF estimated  65 % Without wall motion abnormalities  IMPRESSION:Tara Cruz has essentially normal coronary arteries with a small diagonal branch ostial stenosis. I do not think that her chest pain necessarily is ischemic and I suspect that her Myoview stress test was false positive. Continued medical therapy will be recommended. The sheath was removed and a TR band was placed on the right wrist to achieve patent hemostasis. The patient left the Cath Lab in stable condition. She will be hydrated and discharged home in 2 hours. She will see a mid-level provider back in one week in the back 6-8 weeks thereafter.  Runell Gess MD, [facility] 01/30/2013 8:34 AM

## 2013-02-06 ENCOUNTER — Encounter: Payer: Self-pay | Admitting: Cardiology

## 2013-02-06 ENCOUNTER — Ambulatory Visit (INDEPENDENT_AMBULATORY_CARE_PROVIDER_SITE_OTHER): Payer: 59 | Admitting: Cardiology

## 2013-02-06 VITALS — BP 120/70 | HR 60 | Ht 66.5 in | Wt 164.2 lb

## 2013-02-06 DIAGNOSIS — I2510 Atherosclerotic heart disease of native coronary artery without angina pectoris: Secondary | ICD-10-CM

## 2013-02-06 DIAGNOSIS — R079 Chest pain, unspecified: Secondary | ICD-10-CM

## 2013-02-06 DIAGNOSIS — F17200 Nicotine dependence, unspecified, uncomplicated: Secondary | ICD-10-CM

## 2013-02-06 DIAGNOSIS — E785 Hyperlipidemia, unspecified: Secondary | ICD-10-CM

## 2013-02-06 NOTE — Assessment & Plan Note (Signed)
Again discussed importance of stopping.  She has tried several times over the years.  She continues to try.

## 2013-02-06 NOTE — Assessment & Plan Note (Signed)
On Vytorin with last LDL of 70 - at goal. Continue meds.

## 2013-02-06 NOTE — Assessment & Plan Note (Signed)
single vessel disease of small second diagonal branch, normal EF 65%.  Dr. Allyson Sabal did not feel this was cause of positive stress test, due to size.  Other Cor. Arteries patent.  Last LDL 70.  Continue current meds. She will see Dr. Allyson Sabal back in Oct.

## 2013-02-06 NOTE — Patient Instructions (Signed)
Call us if you have any problems with further chest pain or Rt. Wrist pain. Continue current medications.  Heart Healthy diet.  Continue with your ongoing plans to stop smoking.  Follow up with Dr. Allyson Sabal as planned.

## 2013-02-06 NOTE — Assessment & Plan Note (Signed)
Pain resolved, have asked her to continue NTG if needed as small lesion may cause pain.

## 2013-02-06 NOTE — Progress Notes (Signed)
02/06/2013   PCP: Carrie Mew, MD   Chief Complaint  Patient presents with  . Follow-up    post cath, no chest pain , no sob, edema,rgt wrist has some bruising and a little knot    Primary Cardiologist: Dr. Allyson Sabal  HPI:  62 year old engaged Caucasian female mother of 2, grandmother and 7 grandchildren,  works as an Research scientist (medical) at [employer]. She was referred by Dr. Darryll Capers for cardiovascular evaluation because of chest pain, positive cardiac risk factors, and a positive Myoview stress test. She has 50 pack years history of tobacco abuse smoking one pack per day up until recently now smoking one half pack per day. Other problems include hypertension and hyperlipidemia. She also has COPD. There is no family history of heart disease. She has never had a heart attack or stroke. She had a heart catheterization performed 10-12 years ago which was normal. Approximately 3 weeks ago she developed bilateral jaw pain associated with chest pain and diaphoresis lasting 2-3 minutes. She saw Dr. Amador Cunas who ordered a stress test that showed apical ischemia.  She was seen by Dr. Allyson Sabal and underwent cardiac cath revealing essentially normal coronary arteries with a small diagonal branch ostial stenosis. I do not think that her chest pain necessarily is ischemic and I suspect that her Myoview stress test was false positive. Continued medical therapy will be recommended.    Pt here today for follow up.  No further chest pain.  Some bruising at cath site Rt radial.  No other complaints.  She is planning on see holistic practitioner for smoking cessation.      No Known Allergies  Current Outpatient Prescriptions  Medication Sig Dispense Refill  . albuterol (PROAIR HFA) 108 (90 BASE) MCG/ACT inhaler Inhale 2 puffs into the lungs every 6 (six) hours as needed for wheezing. For wheezing      . aspirin 81 MG tablet Take 81 mg by mouth daily.        . Calcium Carbonate-Vitamin D  (CALCIUM + D PO) Take 1 tablet by mouth daily.      . celecoxib (CELEBREX) 200 MG capsule Take 200 mg by mouth daily.      . cetirizine (ZYRTEC) 10 MG tablet Take 1 tablet (10 mg total) by mouth daily.  90 tablet  3  . estradiol (ESTRACE) 0.5 MG tablet Take 0.5 mg by mouth daily.      Marland Kitchen ezetimibe-simvastatin (VYTORIN) 10-20 MG per tablet Take 1 tablet by mouth daily.      . fluticasone (FLONASE) 50 MCG/ACT nasal spray Place 2 sprays into the nose daily as needed. For nasal congestion      . Fluticasone-Salmeterol (ADVAIR) 250-50 MCG/DOSE AEPB Inhale 1 puff into the lungs every 12 (twelve) hours.      . furosemide (LASIX) 20 MG tablet Take 20 mg by mouth as needed.      . gabapentin (NEURONTIN) 300 MG capsule Take 600 mg by mouth at bedtime.       . Glucosamine HCl (GLUCOSAMINE PO) Take 600 capsules by mouth at bedtime.       . levalbuterol (XOPENEX HFA) 45 MCG/ACT inhaler Inhale 1-2 puffs into the lungs every 4 (four) hours as needed for wheezing.      . lidocaine (XYLOCAINE) 5 % ointment Apply 1 application topically as needed. Anesthetic      . metoprolol succinate (TOPROL-XL) 50 MG 24 hr tablet Take 1 tablet (50 mg total) by mouth daily.  Take with or immediately following a meal.  90 tablet  3  . nitroGLYCERIN (NITROSTAT) 0.4 MG SL tablet Place 1 tablet (0.4 mg total) under the tongue every 5 (five) minutes as needed for chest pain.  50 tablet  3  . RESTASIS 0.05 % ophthalmic emulsion Place 1 drop into both eyes every 12 (twelve) hours.       Marland Kitchen tolterodine (DETROL LA) 4 MG 24 hr capsule Take 1 capsule (4 mg total) by mouth daily.  90 capsule  2  . zolpidem (AMBIEN CR) 12.5 MG CR tablet Take 1 tablet (12.5 mg total) by mouth at bedtime as needed for sleep.  30 tablet  3  . mometasone-formoterol (DULERA) 200-5 MCG/ACT AERO Inhale 2 puffs into the lungs 2 (two) times daily as needed. For wheezing      . [DISCONTINUED] DETROL LA 2 MG 24 hr capsule TAKE 1 CAPSULE DAILY  90 capsule  2   No current  facility-administered medications for this visit.    Past Medical History  Diagnosis Date  . Allergy   . Hyperlipidemia   . Asthma   . COPD (chronic obstructive pulmonary disease)   . Hypertension   . Right bundle branch block   . Abnormal stress test 01/03/2013    false positive by Dr. Allyson Sabal  . CAD (coronary artery disease) 01/30/2013    single vessel disease of small second diagonal branch, normal EF 65%  . Tobacco use     has tried to stop numerous times    Past Surgical History  Procedure Laterality Date  . Abdominal hysterectomy    . Foot surgery    . Cardiac catheterization  01/30/2013    2nd small diag branch with 95% stentosis, too small for intervention.  No disease in other vessels.    WUJ:WJXBJYN:WG colds or fevers, no weight changes Skin:no rashes or ulcers HEENT:no blurred vision, no congestion CV:see HPI PUL:see HPI GI:no diarrhea constipation or melena, no indigestion GU:no hematuria, no dysuria MS:no joint pain, no claudication, bruising rt wrist Neuro:no syncope, no lightheadedness Endo:no diabetes, no thyroid disease  PHYSICAL EXAM BP 120/70  Pulse 60  Ht 5' 6.5" (1.689 m)  Wt 164 lb 3.2 oz (74.481 kg)  BMI 26.11 kg/m2 General:Pleasant affect, NAD Skin:Warm and dry, brisk capillary refill HEENT:normocephalic, sclera clear, mucus membranes moist Heart:S1S2 RRR without murmur, gallup, rub or click Lungs:clear without rales, rhonchi, or wheezes Ext:no lower ext edema, 2+ pedal pulses, 2+ radial pulses, bruising at Rt. Wrist, pea size knot at cath site.  Neuro:alert and oriented, MAE, follows commands, + facial symmetry   ASSESSMENT AND PLAN Chest pain, abnormal stress test, cardiac cath with single vessel diag disease, too small to intervene. Pain resolved, have asked her to continue NTG if needed as small lesion may cause pain.  CAD (coronary artery disease) single vessel disease of small second diagonal branch, normal EF 65%.  Dr. Allyson Sabal did not  feel this was cause of positive stress test, due to size.  Other Cor. Arteries patent.  Last LDL 70.  Continue current meds. She will see Dr. Allyson Sabal back in Oct.   TOBACCO USE Again discussed importance of stopping.  She has tried several times over the years.  She continues to try.  HYPERLIPIDEMIA On Vytorin with last LDL of 70 - at goal. Continue meds.   Reassured concerning cath site.

## 2013-03-29 ENCOUNTER — Encounter: Payer: Self-pay | Admitting: Cardiovascular Disease

## 2013-03-29 ENCOUNTER — Ambulatory Visit (INDEPENDENT_AMBULATORY_CARE_PROVIDER_SITE_OTHER): Payer: 59 | Admitting: Cardiovascular Disease

## 2013-03-29 VITALS — BP 130/86 | HR 54 | Ht 66.5 in | Wt 164.0 lb

## 2013-03-29 DIAGNOSIS — Z79899 Other long term (current) drug therapy: Secondary | ICD-10-CM

## 2013-03-29 DIAGNOSIS — I4510 Unspecified right bundle-branch block: Secondary | ICD-10-CM

## 2013-03-29 DIAGNOSIS — F17200 Nicotine dependence, unspecified, uncomplicated: Secondary | ICD-10-CM

## 2013-03-29 DIAGNOSIS — E785 Hyperlipidemia, unspecified: Secondary | ICD-10-CM

## 2013-03-29 DIAGNOSIS — R079 Chest pain, unspecified: Secondary | ICD-10-CM

## 2013-03-29 DIAGNOSIS — I10 Essential (primary) hypertension: Secondary | ICD-10-CM

## 2013-03-29 NOTE — Assessment & Plan Note (Signed)
Cardiac catheterization performed by myself 01/30/13 then radially revealed small diagonal branch disease but otherwise normal coronaries and normal LV function. Her Myoview as well as positive and her chest pain was noncardiac. I'm going to wean her off her beta blocker.

## 2013-03-29 NOTE — Progress Notes (Signed)
03/29/2013 Tara Cruz   September 11, 1950  [id number]  Primary Physician Carrie Mew, MD Primary Cardiologist: Runell Gess MD [name]   HPI:  Mrs. Tara Cruz is a 62 year old engaged Caucasian female mother of 2, grandmother and 7 grandchildren he works as an Research scientist (medical) at [employer]. She was referred by Dr. Darryll Capers for cardiovascular evaluation because of chest pain, positive cardiac risk factors, and a positive Myoview stress test. She has 50 pack years history of tobacco abuse smoking one pack per day up until recently now smoking one half pack per day. Other problems include hypertension and hyperlipidemia. She also has COPD. There is no family history of heart disease. She has never had a heart attack or stroke. She apparently had a heart catheterization performed 10-12 years ago which was normal. Approximately 3 weeks ago she developed bilateral jaw pain associated with chest pain and diaphoresis lasting 2-3 minutes. She saw Dr. Amador Cunas who ordered a stress test that showed apical ischemia. I eventually performed cardiac catheterization on her as an outpatient 01/30/13 but really revealing ostial small diagonal branch disease, otherwise normal coronaries normal LV function. She's had no recurrent chest pain. We have talked about the importance of smoking cessation. I am going to discontinue her beta blocker.    Current Outpatient Prescriptions  Medication Sig Dispense Refill  . albuterol (PROAIR HFA) 108 (90 BASE) MCG/ACT inhaler Inhale 2 puffs into the lungs every 6 (six) hours as needed for wheezing. For wheezing      . aspirin 81 MG tablet Take 81 mg by mouth daily.        . Calcium Carbonate-Vitamin D (CALCIUM + D PO) Take 1 tablet by mouth daily.      . celecoxib (CELEBREX) 200 MG capsule Take 200 mg by mouth daily.      . cetirizine (ZYRTEC) 10 MG tablet Take 1 tablet (10 mg total) by mouth daily.  90 tablet  3  . estradiol (ESTRACE)  0.5 MG tablet Take 0.5 mg by mouth daily.      Marland Kitchen ezetimibe-simvastatin (VYTORIN) 10-20 MG per tablet Take 1 tablet by mouth daily.      . fluticasone (FLONASE) 50 MCG/ACT nasal spray Place 2 sprays into the nose daily as needed. For nasal congestion      . Fluticasone-Salmeterol (ADVAIR) 250-50 MCG/DOSE AEPB Inhale 1 puff into the lungs every 12 (twelve) hours.      . furosemide (LASIX) 20 MG tablet Take 20 mg by mouth as needed.      . gabapentin (NEURONTIN) 300 MG capsule Take 600 mg by mouth at bedtime.       . Glucosamine HCl (GLUCOSAMINE PO) Take 600 capsules by mouth at bedtime.       . levalbuterol (XOPENEX HFA) 45 MCG/ACT inhaler Inhale 1-2 puffs into the lungs every 4 (four) hours as needed for wheezing.      . lidocaine (XYLOCAINE) 5 % ointment Apply 1 application topically as needed. Anesthetic      . metoprolol succinate (TOPROL-XL) 50 MG 24 hr tablet Take 1 tablet (50 mg total) by mouth daily. Take with or immediately following a meal.  90 tablet  3  . mometasone-formoterol (DULERA) 200-5 MCG/ACT AERO Inhale 2 puffs into the lungs 2 (two) times daily as needed. For wheezing      . nitroGLYCERIN (NITROSTAT) 0.4 MG SL tablet Place 1 tablet (0.4 mg total) under the tongue every 5 (five) minutes as needed for chest pain.  50 tablet  3  . RESTASIS 0.05 % ophthalmic emulsion Place 1 drop into both eyes every 12 (twelve) hours.       Marland Kitchen tolterodine (DETROL LA) 4 MG 24 hr capsule Take 1 capsule (4 mg total) by mouth daily.  90 capsule  2  . zolpidem (AMBIEN CR) 12.5 MG CR tablet Take 1 tablet (12.5 mg total) by mouth at bedtime as needed for sleep.  30 tablet  3  . [DISCONTINUED] DETROL LA 2 MG 24 hr capsule TAKE 1 CAPSULE DAILY  90 capsule  2   No current facility-administered medications for this visit.    No Known Allergies  History   Social History  . Marital Status: Married    Spouse Name: N/A    Number of Children: N/A  . Years of Education: N/A   Occupational History  . Not  on file.   Social History Main Topics  . Smoking status: Current Every Day Smoker -- 1.00 packs/day    Types: Cigarettes    Start date: 03/30/1963  . Smokeless tobacco: Not on file  . Alcohol Use: No  . Drug Use: No  . Sexual Activity: Yes   Other Topics Concern  . Not on file   Social History Narrative  . No narrative on file     Review of Systems: General: negative for chills, fever, night sweats or weight changes.  Cardiovascular: negative for chest pain, dyspnea on exertion, edema, orthopnea, palpitations, paroxysmal nocturnal dyspnea or shortness of breath Dermatological: negative for rash Respiratory: negative for cough or wheezing Urologic: negative for hematuria Abdominal: negative for nausea, vomiting, diarrhea, bright red blood per rectum, melena, or hematemesis Neurologic: negative for visual changes, syncope, or dizziness All other systems reviewed and are otherwise negative except as noted above.    Blood pressure 130/86, pulse 54, height 5' 6.5" (1.689 m), weight 164 lb (74.39 kg).  General appearance: alert and no distress Neck: no adenopathy, no carotid bruit, no JVD, supple, symmetrical, trachea midline and thyroid not enlarged, symmetric, no tenderness/mass/nodules Lungs: clear to auscultation bilaterally Heart: regular rate and rhythm, S1, S2 normal, no murmur, click, rub or gallop Extremities: extremities normal, atraumatic, no cyanosis or edema  EKG first bradycardia at 54 with right bundle branch block unchanged from prior EKGs  ASSESSMENT AND PLAN:   HYPERLIPIDEMIA On statin therapy well-controlled  TOBACCO USE I have counseled her on the importance of smoking cessation  Chest pain, abnormal stress test, cardiac cath with single vessel diag disease, too small to intervene. Cardiac catheterization performed by myself 01/30/13 then radially revealed small diagonal branch disease but otherwise normal coronaries and normal LV function. Her Myoview as  well as positive and her chest pain was noncardiac. I'm going to wean her off her beta blocker.  HYPERTENSION Well controlled on current medications      Runell Gess MD [facility], [facility] 03/29/2013 9:50 AM

## 2013-03-29 NOTE — Assessment & Plan Note (Signed)
I have counseled her on the importance of smoking cessation

## 2013-03-29 NOTE — Patient Instructions (Addendum)
Dr Allyson Sabal has ordered blood work to be done, fasting.  Dr Allyson Sabal would like for you to wean off your metoprolol.  Start 1/2 tablet daily for one week; then 1/2 tablet every other day.You may then discontinue the medication.  We will see you back in 1 year with Dr Allyson Sabal.

## 2013-03-29 NOTE — Assessment & Plan Note (Signed)
On statin therapy well-controlled

## 2013-03-29 NOTE — Assessment & Plan Note (Signed)
Well-controlled on current medications 

## 2013-04-05 ENCOUNTER — Other Ambulatory Visit: Payer: Self-pay

## 2013-04-17 LAB — LIPID PANEL
HDL: 55 mg/dL (ref >39)
LDL Cholesterol: 75 mg/dL (ref 0–99)
Total CHOL/HDL Ratio: 2.7 Ratio
VLDL: 18 mg/dL (ref 0–40)

## 2013-04-17 LAB — HEPATIC FUNCTION PANEL
Albumin: 3.6 g/dL (ref 3.5–5.2)
Bilirubin, Direct: 0.1 mg/dL (ref 0.0–0.3)
Total Bilirubin: 0.4 mg/dL (ref 0.3–1.2)

## 2013-04-18 ENCOUNTER — Encounter: Payer: Self-pay | Admitting: *Deleted

## 2013-04-24 ENCOUNTER — Encounter: Payer: Self-pay | Admitting: *Deleted

## 2013-05-09 ENCOUNTER — Other Ambulatory Visit: Payer: Self-pay | Admitting: Internal Medicine

## 2013-06-11 ENCOUNTER — Other Ambulatory Visit: Payer: Self-pay | Admitting: Internal Medicine

## 2013-06-11 ENCOUNTER — Other Ambulatory Visit (HOSPITAL_COMMUNITY): Payer: Self-pay | Admitting: Internal Medicine

## 2013-06-25 ENCOUNTER — Other Ambulatory Visit: Payer: Self-pay | Admitting: Internal Medicine

## 2013-09-26 ENCOUNTER — Encounter: Payer: Self-pay | Admitting: Internal Medicine

## 2013-10-04 ENCOUNTER — Encounter: Payer: Self-pay | Admitting: Internal Medicine

## 2013-11-14 ENCOUNTER — Other Ambulatory Visit: Payer: Self-pay | Admitting: Internal Medicine

## 2013-11-20 ENCOUNTER — Telehealth: Payer: Self-pay

## 2013-11-20 ENCOUNTER — Telehealth: Payer: Self-pay | Admitting: Internal Medicine

## 2013-11-20 MED ORDER — CETIRIZINE HCL 10 MG PO TABS: 10.0000 mg | ORAL_TABLET | Freq: Every day | ORAL | Status: DC

## 2013-11-20 MED ORDER — EZETIMIBE-SIMVASTATIN 10-20 MG PO TABS: 1.0000 | ORAL_TABLET | Freq: Every day | ORAL | Status: DC

## 2013-11-20 MED ORDER — TOLTERODINE TARTRATE ER 2 MG PO CP24: ORAL_CAPSULE | ORAL | Status: DC

## 2013-11-20 MED ORDER — CELECOXIB 200 MG PO CAPS: ORAL_CAPSULE | ORAL | Status: DC

## 2013-11-20 MED ORDER — ZOLPIDEM TARTRATE ER 12.5 MG PO TBCR: EXTENDED_RELEASE_TABLET | ORAL | Status: DC

## 2013-11-20 MED ORDER — FLUTICASONE PROPIONATE 50 MCG/ACT NA SUSP: 2.0000 | Freq: Every day | NASAL | Status: DC | PRN

## 2013-11-20 MED ORDER — FLUTICASONE-SALMETEROL 250-50 MCG/DOSE IN AEPB: 1.0000 | INHALATION_SPRAY | Freq: Two times a day (BID) | RESPIRATORY_TRACT | Status: DC

## 2013-11-20 MED ORDER — ESTRADIOL 0.5 MG PO TABS: 0.5000 mg | ORAL_TABLET | Freq: Every day | ORAL | Status: DC

## 2013-11-20 NOTE — Telephone Encounter (Signed)
Called and spoke with pt and pt stated she all of her prescriptions sent to [facility]. Ambien sent to [facility].  Pt has some confusion on which inhalers she should be taking.  Will discuss this with Dr. Lovell SheehanJenkins on Friday.

## 2013-11-20 NOTE — Telephone Encounter (Signed)
Pt would to become new pt to dr Amador Cunaskwiatkowski. Can I sch? Pt would like to sch for cpx

## 2013-11-20 NOTE — Telephone Encounter (Signed)
Pt called about some prescriptions that were needed.  Called and left a message for return call.

## 2013-11-20 NOTE — Telephone Encounter (Signed)
ok 

## 2013-11-21 ENCOUNTER — Telehealth: Payer: Self-pay | Admitting: Internal Medicine

## 2013-11-21 NOTE — Telephone Encounter (Signed)
Error/njr °

## 2013-11-21 NOTE — Telephone Encounter (Signed)
Pt has been sch

## 2013-11-22 ENCOUNTER — Telehealth: Payer: Self-pay | Admitting: Internal Medicine

## 2013-11-22 ENCOUNTER — Ambulatory Visit (INDEPENDENT_AMBULATORY_CARE_PROVIDER_SITE_OTHER)
Admission: RE | Admit: 2013-11-22 | Discharge: 2013-11-22 | Disposition: A | Discharge status: Home / Self Care | Payer: 59 | Source: Ambulatory Visit | Attending: Internal Medicine | Admitting: Internal Medicine

## 2013-11-22 ENCOUNTER — Ambulatory Visit (INDEPENDENT_AMBULATORY_CARE_PROVIDER_SITE_OTHER): Payer: 59 | Admitting: Internal Medicine

## 2013-11-22 ENCOUNTER — Encounter: Payer: Self-pay | Admitting: Internal Medicine

## 2013-11-22 VITALS — BP 140/80 | HR 77 | Temp 97.8°F | Resp 20 | Ht 66.5 in | Wt 164.0 lb

## 2013-11-22 DIAGNOSIS — I10 Essential (primary) hypertension: Secondary | ICD-10-CM

## 2013-11-22 DIAGNOSIS — J449 Chronic obstructive pulmonary disease, unspecified: Secondary | ICD-10-CM

## 2013-11-22 MED ORDER — AZITHROMYCIN 250 MG PO TABS: ORAL_TABLET | ORAL | Status: DC

## 2013-11-22 NOTE — Telephone Encounter (Signed)
Pt rx for azithromycin (ZITHROMAX) 250 MG tablet [facility]  [facility] [address]

## 2013-11-22 NOTE — Telephone Encounter (Signed)
Pt notified [facility]

## 2013-11-22 NOTE — Patient Instructions (Signed)
Smoking tobacco is very bad for your health. You should stop smoking immediately.   Take over-the-counter expectorants and cough medications such as  Mucinex DM.  Call if there is no improvement in 5 to 7 days or if  you develop worsening cough, fever, or new symptoms, such as shortness of breath or chest pain.  Chest x-ray as discussed  Presumably maintenance  bronchodilators

## 2013-11-22 NOTE — Progress Notes (Signed)
Pre-visit discussion using our clinic review tool. No additional management support is needed unless otherwise documented below in the visit note.  

## 2013-11-22 NOTE — Progress Notes (Signed)
Subjective:    Patient ID: Tara BottcherPatricia A Cruz, female    DOB: 14-May-1951, 63 y.o.   MRN: [id number]  HPI  63 year old patient who has a history of COPD, asthma, and ongoing tobacco use.  She presents with a two-month history of cough.  More recently this has been associated with more significant sputum production, and some intermittent wheezing.  She has been using rescue bronchodilators, but no maintenance medications.  She states that her fianc, who also is a smoker.  Has had a cough for 3 months.  She is requesting a annual chest x-ray for surveillance  Past Medical History  Diagnosis Date  . Allergy   . Hyperlipidemia   . Asthma   . COPD (chronic obstructive pulmonary disease)   . Hypertension   . Right bundle branch block   . Abnormal stress test 01/03/2013    false positive by Dr. Allyson SabalBerry  . CAD (coronary artery disease) 01/30/2013    single vessel disease of small second diagonal branch, normal EF 65%  . Tobacco use     has tried to stop numerous times    History   Social History  . Marital Status: Married    Spouse Name: N/A    Number of Children: N/A  . Years of Education: N/A   Occupational History  . Not on file.   Social History Main Topics  . Smoking status: Current Every Day Smoker -- 1.00 packs/day    Types: Cigarettes    Start date: 03/30/1963  . Smokeless tobacco: Not on file  . Alcohol Use: No  . Drug Use: No  . Sexual Activity: Yes   Other Topics Concern  . Not on file   Social History Narrative  . No narrative on file    Past Surgical History  Procedure Laterality Date  . Abdominal hysterectomy    . Foot surgery    . Cardiac catheterization  01/30/2013    2nd small diag branch with 95% stentosis, too small for intervention.  No disease in other vessels.    Family History  Problem Relation Age of Onset  . Coronary artery disease Mother   . Alzheimer's disease Mother   . Coronary artery disease Father   . Breast cancer Sister     No Known  Allergies  Current Outpatient Prescriptions on File Prior to Visit  Medication Sig Dispense Refill  . albuterol (PROAIR HFA) 108 (90 BASE) MCG/ACT inhaler Inhale 2 puffs into the lungs every 6 (six) hours as needed for wheezing. For wheezing      . aspirin 81 MG tablet Take 81 mg by mouth daily.        . Calcium Carbonate-Vitamin D (CALCIUM + D PO) Take 1 tablet by mouth daily.      . celecoxib (CELEBREX) 200 MG capsule 1 tab twice a day  180 capsule  1  . cetirizine (ZYRTEC) 10 MG tablet Take 1 tablet (10 mg total) by mouth daily.  90 tablet  1  . estradiol (ESTRACE) 0.5 MG tablet Take 1 tablet (0.5 mg total) by mouth daily.  90 tablet  1  . ezetimibe-simvastatin (VYTORIN) 10-20 MG per tablet Take 1 tablet by mouth daily.  90 tablet  1  . fluticasone (FLONASE) 50 MCG/ACT nasal spray Place 2 sprays into both nostrils daily as needed. For nasal congestion  48 g  1  . Fluticasone-Salmeterol (ADVAIR) 250-50 MCG/DOSE AEPB Inhale 1 puff into the lungs every 12 (twelve) hours.  60 each  5  .  furosemide (LASIX) 20 MG tablet Take 20 mg by mouth as needed.      . Glucosamine HCl (GLUCOSAMINE PO) Take 600 capsules by mouth at bedtime.       . levalbuterol (XOPENEX HFA) 45 MCG/ACT inhaler Inhale 1-2 puffs into the lungs every 4 (four) hours as needed for wheezing.      . lidocaine (XYLOCAINE) 5 % ointment Apply 1 application topically as needed. Anesthetic      . mometasone-formoterol (DULERA) 200-5 MCG/ACT AERO Inhale 2 puffs into the lungs 2 (two) times daily as needed. For wheezing      . nitroGLYCERIN (NITROSTAT) 0.4 MG SL tablet Place 1 tablet (0.4 mg total) under the tongue every 5 (five) minutes as needed for chest pain.  50 tablet  3  . RESTASIS 0.05 % ophthalmic emulsion Place 1 drop into both eyes every 12 (twelve) hours.       Marland Kitchen tolterodine (DETROL LA) 4 MG 24 hr capsule TAKE 1 CAPSULE (4 MG TOTAL) DAILY  90 capsule  1  . zolpidem (AMBIEN CR) 12.5 MG CR tablet take 1 tablet by mouth at bedtime  if needed for sleep  30 tablet  5  . [DISCONTINUED] DETROL LA 2 MG 24 hr capsule TAKE 1 CAPSULE DAILY  90 capsule  2   No current facility-administered medications on file prior to visit.    BP 140/80  Pulse 77  Temp(Src) 97.8 F (36.6 C) (Oral)  Resp 20  Ht 5' 6.5" (1.689 m)  Wt 164 lb (74.39 kg)  BMI 26.08 kg/m2  SpO2 96%       Review of Systems  Constitutional: Negative.   HENT: Negative for congestion, dental problem, hearing loss, rhinorrhea, sinus pressure, sore throat and tinnitus.   Eyes: Negative for pain, discharge and visual disturbance.  Respiratory: Positive for cough, shortness of breath and wheezing.   Cardiovascular: Negative for chest pain, palpitations and leg swelling.  Gastrointestinal: Negative for nausea, vomiting, abdominal pain, diarrhea, constipation, blood in stool and abdominal distention.  Genitourinary: Negative for dysuria, urgency, frequency, hematuria, flank pain, vaginal bleeding, vaginal discharge, difficulty urinating, vaginal pain and pelvic pain.  Musculoskeletal: Negative for arthralgias, gait problem and joint swelling.  Skin: Negative for rash.  Neurological: Negative for dizziness, syncope, speech difficulty, weakness, numbness and headaches.  Hematological: Negative for adenopathy.  Psychiatric/Behavioral: Negative for behavioral problems, dysphoric mood and agitation. The patient is not nervous/anxious.        Objective:   Physical Exam  Constitutional: She is oriented to person, place, and time. She appears well-developed and well-nourished. No distress.  HENT:  Head: Normocephalic.  Right Ear: External ear normal.  Left Ear: External ear normal.  Mouth/Throat: Oropharynx is clear and moist.  Eyes: Conjunctivae and EOM are normal. Pupils are equal, round, and reactive to light.  Neck: Normal range of motion. Neck supple. No thyromegaly present.  Cardiovascular: Normal rate, regular rhythm, normal heart sounds and intact distal  pulses.   Pulmonary/Chest: Effort normal. She has wheezes.  Scattered faint wheezing  Abdominal: Soft. Bowel sounds are normal. She exhibits no mass. There is no tenderness.  Musculoskeletal: Normal range of motion.  Lymphadenopathy:    She has no cervical adenopathy.  Neurological: She is alert and oriented to person, place, and time.  Skin: Skin is warm and dry. No rash noted.  Psychiatric: She has a normal mood and affect. Her behavior is normal.          Assessment & Plan:  COPD with bronchitis.  Resume maintenance medications.  Treat with azithromycin.  Total smoking cessation encouraged.  We'll check a chest x-ray Hypertension Ongoing tobacco use Single-vessel CAD

## 2013-11-23 ENCOUNTER — Telehealth: Payer: Self-pay | Admitting: Internal Medicine

## 2013-11-23 NOTE — Telephone Encounter (Signed)
Left a message for return call.  

## 2013-11-23 NOTE — Telephone Encounter (Signed)
Per Dr. Lovell SheehanJenkins pt should be on all inhalers except xopenex. .Marland Kitchen

## 2013-11-23 NOTE — Telephone Encounter (Signed)
Relevant patient education assigned to patient using Emmi. ° °

## 2013-11-23 NOTE — Addendum Note (Signed)
Addended by: Azucena FreedMILLNER, Amoreena Neubert C on: 11/23/2013 04:41 PM   Modules accepted: Orders, Medications

## 2013-12-04 ENCOUNTER — Telehealth: Payer: Self-pay | Admitting: Internal Medicine

## 2013-12-04 MED ORDER — FLUTICASONE PROPIONATE 50 MCG/ACT NA SUSP: 2.0000 | Freq: Every day | NASAL | Status: DC | PRN

## 2013-12-04 MED ORDER — FLUTICASONE-SALMETEROL 250-50 MCG/DOSE IN AEPB: 1.0000 | INHALATION_SPRAY | Freq: Two times a day (BID) | RESPIRATORY_TRACT | Status: DC

## 2013-12-04 MED ORDER — EZETIMIBE-SIMVASTATIN 10-20 MG PO TABS: 1.0000 | ORAL_TABLET | Freq: Every day | ORAL | Status: DC

## 2013-12-04 MED ORDER — TOLTERODINE TARTRATE ER 4 MG PO CP24: ORAL_CAPSULE | ORAL | Status: DC

## 2013-12-04 MED ORDER — CELECOXIB 200 MG PO CAPS: ORAL_CAPSULE | ORAL | Status: DC

## 2013-12-04 MED ORDER — ALBUTEROL SULFATE HFA 108 (90 BASE) MCG/ACT IN AERS: 2.0000 | INHALATION_SPRAY | Freq: Four times a day (QID) | RESPIRATORY_TRACT | Status: DC | PRN

## 2013-12-04 MED ORDER — ESTRADIOL 0.5 MG PO TABS: 0.5000 mg | ORAL_TABLET | Freq: Every day | ORAL | Status: DC

## 2013-12-04 NOTE — Telephone Encounter (Signed)
Pt [facility]xs [facility]. Pt is now on [employer]. Pt needs  zolpidem 12.5mg  , tolterodine,advair 250-50,vytorin 10-20 mg,proair hfa,celebrex 200 mg and estradiol 0.5 mg. Pt needs 90 day supply of each meds w/refills

## 2013-12-04 NOTE — Telephone Encounter (Signed)
Spoke to pt told her [facility]23 by Dr. Lovell SheehanJenkins. Pt said yes but they said she was not covered by them anymore and she is through [employer] and needs meds resent. Told pt okay will resend [facility] except Zolpidem it was sent to your local pharmacy with refils. Pt verbalized understanding. Rx's sent.

## 2014-01-01 ENCOUNTER — Encounter: Payer: Self-pay | Admitting: Internal Medicine

## 2014-01-01 ENCOUNTER — Ambulatory Visit (INDEPENDENT_AMBULATORY_CARE_PROVIDER_SITE_OTHER): Payer: 59 | Admitting: Internal Medicine

## 2014-01-01 VITALS — BP 132/90 | HR 66 | Temp 98.3°F | Ht 66.0 in | Wt 161.8 lb

## 2014-01-01 DIAGNOSIS — J45909 Unspecified asthma, uncomplicated: Secondary | ICD-10-CM

## 2014-01-01 DIAGNOSIS — F17200 Nicotine dependence, unspecified, uncomplicated: Secondary | ICD-10-CM

## 2014-01-01 MED ORDER — PREDNISONE 10 MG PO TABS: ORAL_TABLET | ORAL | Status: DC

## 2014-01-01 MED ORDER — AZITHROMYCIN 250 MG PO TABS: ORAL_TABLET | ORAL | Status: DC

## 2014-01-01 NOTE — Patient Instructions (Addendum)
Try zpak and if green mucus not gone > augmentin x 10d  Prednisone 10 mg take  4 each am x 2 days,   2 each am x 2 days,  1 each am x 2 days and stop   Most likely you have asthmatic bronchitis, not copd > the difference is that the former will go away completely with smoking abstinence > good luck!  Work on inhaler technique:  relax and gently blow all the way out then take a nice smooth deep breath back in, triggering the inhaler at same time you start breathing in.  Hold for up to 5 seconds if you can.  Rinse and gargle with water when done     Please schedule a follow up office visit in 2  weeks, sooner if needed with pfts on return (if can't schedule this we will just do spirometry in office)

## 2014-01-01 NOTE — Progress Notes (Signed)
   Subjective:    Patient ID: Tara Cruz, female    DOB: April 16, 1951  MRN: [id number]  HPI  [age] yowf active smoker dx as asthma vs copd x 2005 by Lovell SheehanJenkins intermittently taking advair  but in between flares which occur sev times a year does fine s meds at all self referred to pulmonary clinic 01/01/14 ? Do I have copd?   01/01/2014 1st [place] [facility] office visit/ Demitrious Mccannon  Chief Complaint  Patient presents with  . Pulmonary Consult    Self referral. Pt c/o prod cough with green sputum x 1 month.  She states that she gets bronchitis every spring and fall after coming home from the beach.  She was told approx 5 yrs ago she has COPD and Asthma.    baseline x year doe x hill in heat o/w fine - yardwork w/in last weeks s advair s inhalers s problem  Has not used any inhalers day of ov Cough is each am worse and came on a month prior to OV  p car trip to beach, min prod green sputum  No obvious other patterns in day to day or daytime variabilty or assoc   cp or chest tightness, subjective wheeze overt sinus or hb symptoms. No unusual exp hx or h/o childhood pna/ asthma or knowledge of premature birth.  Sleeping ok without nocturnal  or early am exacerbation  of respiratory  c/o's or need for noct saba. Also denies any obvious fluctuation of symptoms with weather or environmental changes or other aggravating or alleviating factors except as outlined above   Current Medications, Allergies, Complete Past Medical History, Past Surgical History, Family History, and Social History were reviewed in [facility] electronic medical record.            Review of Systems  Constitutional: Negative for fever, chills and unexpected weight change.  HENT: Negative for congestion, dental problem, ear pain, nosebleeds, postnasal drip, rhinorrhea, sinus pressure, sneezing, sore throat, trouble swallowing and voice change.   Eyes: Negative for visual disturbance.  Respiratory: Positive for cough. Negative  for choking and shortness of breath.   Cardiovascular: Negative for chest pain and leg swelling.  Gastrointestinal: Negative for vomiting, abdominal pain and diarrhea.  Genitourinary: Negative for difficulty urinating.  Musculoskeletal: Negative for arthralgias.  Skin: Negative for rash.  Neurological: Negative for tremors, syncope and headaches.  Hematological: Does not bruise/bleed easily.       Objective:   Physical Exam  amb wf nad  Wt Readings from Last 3 Encounters:  01/01/14 161 lb 12.8 oz (73.392 kg)  11/22/13 164 lb (74.39 kg)  03/29/13 164 lb (74.39 kg)     HEENT: nl dentition, turbinates, and orophanx. Nl external ear canals without cough reflex   NECK :  without JVD/Nodes/TM/ nl carotid upstrokes bilaterally   LUNGS: no acc muscle use, bilateral mid exp rhonchi   CV:  RRR  no s3 or murmur or increase in P2, no edema   ABD:  soft and nontender with nl excursion in the supine position. No bruits or organomegaly, bowel sounds nl  MS:  warm without deformities, calf tenderness, cyanosis or clubbing  SKIN: warm and dry without lesions    NEURO:  alert, approp, no deficits     11/22/13 No acute cardiopulmonary abnormality.     Assessment & Plan:

## 2014-01-02 ENCOUNTER — Telehealth: Payer: Self-pay | Admitting: Internal Medicine

## 2014-01-02 NOTE — Telephone Encounter (Signed)
According to OV 01/01/14; Patient Instructions     Augmentin 875 mg take one pill twice daily X 10 days - take at breakfast and supper with large glass of water. It would help reduce the usual side effects (diarrhea and yeast infections) if you ate cultured yogurt at lunch  --   azithromycin was sent to the pharmacy and is what pt picked up. Please advise MW thanks

## 2014-01-02 NOTE — Assessment & Plan Note (Signed)
>   3 m  I took an extended  opportunity with this patient to outline the consequences of continued cigarette use  in airway disorders based on all the data we have from the multiple national lung health studies (perfomed over decades at millions of dollars in cost)  indicating that smoking cessation, not choice of inhalers or physicians, is the most important aspect of care.   

## 2014-01-02 NOTE — Assessment & Plan Note (Addendum)
DDX of  difficult airways management all start with A and  include Adherence, Ace Inhibitors, Acid Reflux, Active Sinus Disease, Alpha 1 Antitripsin deficiency, Anxiety masquerading as Airways dz,  ABPA,  allergy(esp in young), Aspiration (esp in elderly), Adverse effects of DPI,  Active smokers, plus two Bs  = Bronchiectasis and Beta blocker use..and one C= CHF  Adherence is always the initial "prime suspect" and is a multilayered concern that requires a "trust but verify" approach in every patient - starting with knowing how to use medications, especially inhalers, correctly, keeping up with refills and understanding the fundamental difference between maintenance and prns vs those medications only taken for a very short course and then stopped and not refilled.  - The proper method of use, as well as anticipated side effects, of a metered-dose inhaler are discussed and demonstrated to the patient. Improved effectiveness after extensive coaching during this visit to a level of approximately  75%   Not clear whether she really needs to be on maint rx or not for now  ? Active sinus dz > try zpak first and if still discolored mucus then augmentin x 10 days then sinus ct if not clear  Active smoking obviously greatest concern > needs pfts after rx to establish baseline/ r/o copd

## 2014-01-02 NOTE — Telephone Encounter (Signed)
Called spoke with pt. Aware of recs. RX called in. Nothing further needed 

## 2014-01-02 NOTE — Telephone Encounter (Signed)
Sorry for the confusion >>zpak will probably work fine and is a lot cheaper with less side effects  but if does not convert all the mucus to white then will need 10 d of augmentin

## 2014-01-11 ENCOUNTER — Encounter: Payer: Self-pay | Admitting: Internal Medicine

## 2014-01-11 ENCOUNTER — Ambulatory Visit (INDEPENDENT_AMBULATORY_CARE_PROVIDER_SITE_OTHER): Payer: 59 | Admitting: Internal Medicine

## 2014-01-11 VITALS — BP 94/60 | HR 64 | Temp 98.8°F | Ht 64.5 in | Wt 163.0 lb

## 2014-01-11 DIAGNOSIS — J45909 Unspecified asthma, uncomplicated: Secondary | ICD-10-CM

## 2014-01-11 DIAGNOSIS — J4489 Other specified chronic obstructive pulmonary disease: Secondary | ICD-10-CM

## 2014-01-11 DIAGNOSIS — J449 Chronic obstructive pulmonary disease, unspecified: Secondary | ICD-10-CM

## 2014-01-11 NOTE — Progress Notes (Signed)
Subjective:    Patient ID: Tara Cruz, female    DOB: [birth year]  MRN: [id number]    Brief patient profile:  43 yowf active smoker dx as asthma vs copd x 2005 by Lovell Sheehan intermittently taking advair  but in between flares which occur sev times a year does fine s meds at all self referred to pulmonary clinic 01/01/14 ? Do I have copd?  A GOLD I criteria 01/11/14 p short term rx with zpak and 6 d prednisone   01/01/2014 1st [facility] office visit/ Tara Cruz  Chief Complaint  Patient presents with  . Pulmonary Consult    Self referral. Pt c/o prod cough with green sputum x 1 month.  She states that she gets bronchitis every spring and fall after coming home from the beach.  She was told approx 5 yrs ago she has COPD and Asthma.    baseline x year doe x hill in heat o/w fine - yardwork w/in last weeks s advair s inhalers s problem  Has not used any inhalers day of ov Cough is each am worse and came on a month prior to OV  p car trip to beach, min prod green sputum rec Try zpak and if green mucus not gone > augmentin x 10d Prednisone 10 mg take  4 each am x 2 days,   2 each am x 2 days,  1 each am x 2 days and stop  Most likely you have asthmatic bronchitis, not copd > the difference is that the former will go away completely with smoking abstinence > good luck! Work on inhaler technique:    01/11/2014 f/u ov/Tara Cruz re: GOLD I copd / still smoking / on xopenex prn only  Chief Complaint  Patient presents with  . Followup with PFT    Pt states that her cough is much improved. Breathing is doing well. No new co's. Has only used rescue inhaler x 1 since last visit.   Not limited by breathing from desired activities     No obvious day to day or daytime variabilty or assoc chronic cough or cp or chest tightness, subjective wheeze overt sinus or hb symptoms. No unusual exp hx or h/o childhood pna/ asthma or knowledge of premature birth.  Sleeping ok without nocturnal  or early am  exacerbation  of respiratory  c/o's or need for noct saba. Also denies any obvious fluctuation of symptoms with weather or environmental changes or other aggravating or alleviating factors except as outlined above   Current Medications, Allergies, Complete Past Medical History, Past Surgical History, Family History, and Social History were reviewed in [employer] record.  ROS  The following are not active complaints unless bolded sore throat, dysphagia, dental problems, itching, sneezing,  nasal congestion or excess/ purulent secretions, ear ache,   fever, chills, sweats, unintended wt loss, pleuritic or exertional cp, hemoptysis,  orthopnea pnd or leg swelling, presyncope, palpitations, heartburn, abdominal pain, anorexia, nausea, vomiting, diarrhea  or change in bowel or urinary habits, change in stools or urine, dysuria,hematuria,  rash, arthralgias, visual complaints, headache, numbness weakness or ataxia or problems with walking or coordination,  change in mood/affect or memory.                       Objective:   Physical Exam  amb wf nad  01/11/2014       163  Wt Readings from Last 3 Encounters:  01/01/14 161 lb 12.8 oz (73.392  kg)  11/22/13 164 lb (74.39 kg)  03/29/13 164 lb (74.39 kg)     HEENT: nl dentition, turbinates, and orophanx. Nl external ear canals without cough reflex   NECK :  without JVD/Nodes/TM/ nl carotid upstrokes bilaterally   LUNGS: no acc muscle use, min bilateral  exp rhonchi   CV:  RRR  no s3 or murmur or increase in P2, no edema   ABD:  soft and nontender with nl excursion in the supine position. No bruits or organomegaly, bowel sounds nl  MS:  warm without deformities, calf tenderness, cyanosis or clubbing  SKIN: warm and dry without lesions    NEURO:  alert, approp, no deficits     11/22/13 No acute cardiopulmonary abnormality.     Assessment & Plan:

## 2014-01-11 NOTE — Patient Instructions (Signed)
You have a classic GOLD I very mild copd that is very unlikely to progress if you stop smoking now

## 2014-01-11 NOTE — Progress Notes (Signed)
PFT done today. 

## 2014-01-12 NOTE — Assessment & Plan Note (Addendum)
PFT's 01/11/2014 FEV1  2.04 (83%) ratio 65 no change p B2 and DLCO 64 %  I had an extended discussion with the patient today lasting 15 to 20 minutes of a 25 minute visit on the following issues: Only very mild copd despite active smoking but enough to reduce her chances of getting an affordable  life insurance policy at age 63   I reviewed the Flethcher curve with patient that basically indicates  if you quit smoking when your best day FEV1 is still well preserved (as is the case here)  it is highly unlikely you will progress to severe disease and informed the patient there was no medication on the market that has proven to change the curve or the likelihood of progression.  Therefore stopping smoking and maintaining abstinence is the most important aspect of care, not choice of inhalers or for that matter, doctors.    Ok to just use xopenex prn - pulmonary f/u is prn

## 2014-01-13 LAB — PULMONARY FUNCTION TEST
DL/VA % pred: 63 %
DL/VA: 3.07 ml/min/mmHg/L
DLCO UNC % PRED: 64 %
DLCO unc: 16.09 ml/min/mmHg
FEF 25-75 POST: 1.15 L/s
FEF 25-75 PRE: 1.05 L/s
FEF2575-%CHANGE-POST: 9 %
FEF2575-%PRED-POST: 53 %
FEF2575-%Pred-Pre: 49 %
FEV1-%CHANGE-POST: 1 %
FEV1-%PRED-POST: 83 %
FEV1-%PRED-PRE: 82 %
FEV1-PRE: 2.02 L
FEV1-Post: 2.04 L
FEV1FVC-%Change-Post: 0 %
FEV1FVC-%PRED-PRE: 84 %
FEV6-%Change-Post: 4 %
FEV6-%Pred-Post: 100 %
FEV6-%Pred-Pre: 96 %
FEV6-Post: 3.1 L
FEV6-Pre: 2.98 L
FEV6FVC-%Change-Post: 2 %
FEV6FVC-%PRED-PRE: 100 %
FEV6FVC-%Pred-Post: 102 %
FVC-%CHANGE-POST: 2 %
FVC-%Pred-Post: 98 %
FVC-%Pred-Pre: 96 %
FVC-Post: 3.16 L
FVC-Pre: 3.1 L
POST FEV1/FVC RATIO: 65 %
PRE FEV1/FVC RATIO: 65 %
PRE FEV6/FVC RATIO: 96 %
Post FEV6/FVC ratio: 98 %
RV % PRED: 123 %
RV: 2.61 L
TLC % pred: 116 %
TLC: 6 L

## 2014-01-15 ENCOUNTER — Other Ambulatory Visit (INDEPENDENT_AMBULATORY_CARE_PROVIDER_SITE_OTHER): Payer: 59

## 2014-01-15 DIAGNOSIS — Z0000 Encounter for general adult medical examination without abnormal findings: Secondary | ICD-10-CM

## 2014-01-15 LAB — CBC WITH DIFFERENTIAL/PLATELET
BASOS PCT: 0.5 % (ref 0.0–3.0)
Basophils Absolute: 0 10*3/uL (ref 0.0–0.1)
EOS PCT: 3 % (ref 0.0–5.0)
Eosinophils Absolute: 0.2 10*3/uL (ref 0.0–0.7)
HEMATOCRIT: 41.9 % (ref 36.0–46.0)
HEMOGLOBIN: 13.9 g/dL (ref 12.0–15.0)
LYMPHS ABS: 1.6 10*3/uL (ref 0.7–4.0)
Lymphocytes Relative: 23.7 % (ref 12.0–46.0)
MCHC: 33 g/dL (ref 30.0–36.0)
MCV: 98.2 fl (ref 78.0–100.0)
MONO ABS: 0.7 10*3/uL (ref 0.1–1.0)
MONOS PCT: 10.8 % (ref 3.0–12.0)
NEUTROS ABS: 4.1 10*3/uL (ref 1.4–7.7)
Neutrophils Relative %: 62 % (ref 43.0–77.0)
PLATELETS: 237 10*3/uL (ref 150.0–400.0)
RBC: 4.27 Mil/uL (ref 3.87–5.11)
RDW: 15.2 % (ref 11.5–15.5)
WBC: 6.6 10*3/uL (ref 4.0–10.5)

## 2014-01-15 LAB — LIPID PANEL
CHOL/HDL RATIO: 3
Cholesterol: 147 mg/dL (ref 0–200)
HDL: 51.7 mg/dL (ref >39.00)
LDL Cholesterol: 70 mg/dL (ref 0–99)
NONHDL: 95.3
Triglycerides: 126 mg/dL (ref 0.0–149.0)
VLDL: 25.2 mg/dL (ref 0.0–40.0)

## 2014-01-15 LAB — HEPATIC FUNCTION PANEL
ALBUMIN: 3.5 g/dL (ref 3.5–5.2)
ALT: 23 U/L (ref 0–35)
AST: 21 U/L (ref 0–37)
Alkaline Phosphatase: 53 U/L (ref 39–117)
Bilirubin, Direct: 0.1 mg/dL (ref 0.0–0.3)
TOTAL PROTEIN: 6 g/dL (ref 6.0–8.3)
Total Bilirubin: 0.7 mg/dL (ref 0.2–1.2)

## 2014-01-15 LAB — BASIC METABOLIC PANEL
BUN: 24 mg/dL — AB (ref 6–23)
CHLORIDE: 108 meq/L (ref 96–112)
CO2: 29 mEq/L (ref 19–32)
Calcium: 9.7 mg/dL (ref 8.4–10.5)
Creatinine, Ser: 1.2 mg/dL (ref 0.4–1.2)
GFR: 50.26 mL/min — AB (ref >60.00)
Glucose, Bld: 79 mg/dL (ref 70–99)
POTASSIUM: 4.5 meq/L (ref 3.5–5.1)
SODIUM: 144 meq/L (ref 135–145)

## 2014-01-15 LAB — POCT URINALYSIS DIPSTICK
BILIRUBIN UA: NEGATIVE
Glucose, UA: NEGATIVE
Ketones, UA: NEGATIVE
LEUKOCYTES UA: NEGATIVE
NITRITE UA: NEGATIVE
PH UA: 5
PROTEIN UA: NEGATIVE
RBC UA: NEGATIVE
Spec Grav, UA: <=1.005
Urobilinogen, UA: 0.2

## 2014-01-15 LAB — TSH: TSH: 2.12 u[IU]/mL (ref 0.35–4.50)

## 2014-01-22 ENCOUNTER — Ambulatory Visit (INDEPENDENT_AMBULATORY_CARE_PROVIDER_SITE_OTHER): Payer: 59 | Admitting: Internal Medicine

## 2014-01-22 ENCOUNTER — Encounter: Payer: Self-pay | Admitting: *Deleted

## 2014-01-22 ENCOUNTER — Encounter: Payer: Self-pay | Admitting: Internal Medicine

## 2014-01-22 VITALS — BP 130/80 | HR 72 | Temp 97.8°F | Resp 20 | Ht 65.5 in | Wt 164.0 lb

## 2014-01-22 DIAGNOSIS — I10 Essential (primary) hypertension: Secondary | ICD-10-CM

## 2014-01-22 DIAGNOSIS — F17200 Nicotine dependence, unspecified, uncomplicated: Secondary | ICD-10-CM

## 2014-01-22 DIAGNOSIS — Z0000 Encounter for general adult medical examination without abnormal findings: Secondary | ICD-10-CM

## 2014-01-22 DIAGNOSIS — E785 Hyperlipidemia, unspecified: Secondary | ICD-10-CM

## 2014-01-22 DIAGNOSIS — I2510 Atherosclerotic heart disease of native coronary artery without angina pectoris: Secondary | ICD-10-CM

## 2014-01-22 DIAGNOSIS — M479 Spondylosis, unspecified: Secondary | ICD-10-CM

## 2014-01-22 DIAGNOSIS — J45909 Unspecified asthma, uncomplicated: Secondary | ICD-10-CM

## 2014-01-22 MED ORDER — PREDNISONE 10 MG PO TABS: 10.0000 mg | ORAL_TABLET | Freq: Two times a day (BID) | ORAL | Status: DC

## 2014-01-22 NOTE — Progress Notes (Signed)
Subjective:    Patient ID: Tara Cruz, female    DOB: 1950-10-21, 63 y.o.   MRN: [id number]  HPI  63 year old patient who is seen today for a preventive health examination She was evaluated by cardiology.  Last fall, which included a heart catheterization.  This revealed single-vessel disease, and the patient has been treated medically.  She has dyslipidemia and a history of mild asthma, COPD.  She continues to smoke about 3 fourths of a pack per day.  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  She remains quite active.  Last colonoscopy 2006  Past Medical History  Diagnosis Date  . Allergy   . Hyperlipidemia   . Asthma   . COPD (chronic obstructive pulmonary disease)   . Hypertension   . Right bundle branch block   . Abnormal stress test 01/03/2013    false positive by Dr. Allyson Sabal  . CAD (coronary artery disease) 01/30/2013    single vessel disease of small second diagonal branch, normal EF 65%  . Tobacco use     has tried to stop numerous times    History   Social History  . Marital Status: Married    Spouse Name: N/A    Number of Children: N/A  . Years of Education: N/A   Occupational History  . Retired [employer]   Social History Main Topics  . Smoking status: Current Every Day Smoker -- 0.75 packs/day for 50 years    Types: Cigarettes  . Smokeless tobacco: Never Used  . Alcohol Use: No  . Drug Use: No  . Sexual Activity: Yes   Other Topics Concern  . Not on file   Social History Narrative  . No narrative on file    Past Surgical History  Procedure Laterality Date  . Abdominal hysterectomy    . Foot surgery    . Cardiac catheterization  01/30/2013    2nd small diag branch with 95% stentosis, too small for intervention.  No disease in other vessels.    Family History  Problem Relation Age of Onset  . Coronary artery disease Mother   . Alzheimer's disease Mother   . Coronary artery disease Father   . Breast cancer  Sister   . Rheum arthritis Mother   . Asthma Sister     No Known Allergies  Current Outpatient Prescriptions on File Prior to Visit  Medication Sig Dispense Refill  . aspirin 81 MG tablet Take 81 mg by mouth daily.        . Calcium Carbonate-Vitamin D (CALCIUM + D PO) Take 1 tablet by mouth daily.      . celecoxib (CELEBREX) 200 MG capsule 1 tab twice a day  180 capsule  1  . cetirizine (ZYRTEC) 10 MG tablet Take 10 mg by mouth daily as needed.      Marland Kitchen estradiol (ESTRACE) 0.5 MG tablet Take 1 tablet (0.5 mg total) by mouth daily.  90 tablet  1  . ezetimibe-simvastatin (VYTORIN) 10-20 MG per tablet Take 1 tablet by mouth daily.  90 tablet  1  . fluticasone (FLONASE) 50 MCG/ACT nasal spray Place 2 sprays into both nostrils daily as needed. For nasal congestion  48 g  3  . Glucosamine HCl (GLUCOSAMINE PO) Take 600 capsules by mouth at bedtime.       . levalbuterol (XOPENEX HFA) 45 MCG/ACT inhaler Inhale 2 puffs into the lungs every 4 (four) hours as needed for wheezing.      Marland Kitchen  lidocaine (XYLOCAINE) 5 % ointment Apply 1 application topically as needed. Anesthetic      . zolpidem (AMBIEN CR) 12.5 MG CR tablet take 1 tablet by mouth at bedtime if needed for sleep  30 tablet  5  . [DISCONTINUED] DETROL LA 2 MG 24 hr capsule TAKE 1 CAPSULE DAILY  90 capsule  2   No current facility-administered medications on file prior to visit.    BP 130/80  Pulse 72  Temp(Src) 97.8 F (36.6 C) (Oral)  Resp 20  Ht 5' 5.5" (1.664 m)  Wt 164 lb (74.39 kg)  BMI 26.87 kg/m2  SpO2 98%     Review of Systems  Constitutional: Negative for fever, appetite change, fatigue and unexpected weight change.  HENT: Negative for congestion, dental problem, ear pain, hearing loss, mouth sores, nosebleeds, sinus pressure, sore throat, tinnitus, trouble swallowing and voice change.   Eyes: Negative for photophobia, pain, redness and visual disturbance.  Respiratory: Negative for cough, chest tightness and shortness of  breath.   Cardiovascular: Negative for chest pain, palpitations and leg swelling.  Gastrointestinal: Negative for nausea, vomiting, abdominal pain, diarrhea, constipation, blood in stool, abdominal distention and rectal pain.  Genitourinary: Negative for dysuria, urgency, frequency, hematuria, flank pain, vaginal bleeding, vaginal discharge, difficulty urinating, genital sores, vaginal pain, menstrual problem and pelvic pain.  Musculoskeletal: Positive for arthralgias and back pain. Negative for neck stiffness.  Skin: Negative for rash.  Neurological: Negative for dizziness, syncope, speech difficulty, weakness, light-headedness, numbness and headaches.  Hematological: Negative for adenopathy. Does not bruise/bleed easily.  Psychiatric/Behavioral: Negative for suicidal ideas, behavioral problems, self-injury, dysphoric mood and agitation. The patient is not nervous/anxious.        Objective:   Physical Exam  Constitutional: She is oriented to person, place, and time. She appears well-developed and well-nourished.  HENT:  Head: Normocephalic and atraumatic.  Right Ear: External ear normal.  Left Ear: External ear normal.  Mouth/Throat: Oropharynx is clear and moist.  Eyes: Conjunctivae and EOM are normal.  Neck: Normal range of motion. Neck supple. No JVD present. No thyromegaly present.  Cardiovascular: Normal rate, regular rhythm, normal heart sounds and intact distal pulses.   No murmur heard. The left posterior tibial pulse, slightly diminished  Pulmonary/Chest: Effort normal and breath sounds normal. She has no wheezes. She has no rales.  Abdominal: Soft. Bowel sounds are normal. She exhibits no distension and no mass. There is no tenderness. There is no rebound and no guarding.  Genitourinary: Vagina normal.  Musculoskeletal: Normal range of motion. She exhibits no edema and no tenderness.  Neurological: She is alert and oriented to person, place, and time. She has normal reflexes.  No cranial nerve deficit. She exhibits normal muscle tone. Coordination normal.  Skin: Skin is warm and dry. No rash noted.  Psychiatric: She has a normal mood and affect. Her behavior is normal.          Assessment & Plan:   Preventive health examination Single vessel coronary artery disease Dyslipidemia Hypertension Mild COPD Ongoing tobacco use.  Total smoking cessation encouraged  She will have followup with GYN Colonoscopy one year  Medications updated

## 2014-01-22 NOTE — Progress Notes (Signed)
Pre visit review using our clinic review tool, if applicable. No additional management support is needed unless otherwise documented below in the visit note. 

## 2014-01-22 NOTE — Patient Instructions (Signed)
Limit your sodium (Salt) intake    It is important that you exercise regularly, at least 20 minutes 3 to 4 times per week.  If you develop chest pain or shortness of breath seek  medical attention.  Please check your blood pressure on a regular basis.  If it is consistently greater than 150/90, please make an office appointment.  Smoking tobacco is very bad for your health. You should stop smoking immediately.  Cardiac Diet This diet can help prevent heart disease and stroke. Many factors influence your heart health, including eating and exercise habits. Coronary risk rises a lot with abnormal blood fat (lipid) levels. Cardiac meal planning includes limiting unhealthy fats, increasing healthy fats, and making other small dietary changes. General guidelines are as follows:  Adjust calorie intake to reach and maintain desirable body weight.  Limit total fat intake to less than 30% of total calories. Saturated fat should be less than 7% of calories.  Saturated fats are found in animal products and in some vegetable products. Saturated vegetable fats are found in coconut oil, cocoa butter, palm oil, and palm kernel oil. Read labels carefully to avoid these products as much as possible. Use butter in moderation. Choose tub margarines and oils that have 2 grams of fat or less. Good cooking oils are canola and olive oils.  Practice low-fat cooking techniques. Do not fry food. Instead, broil, bake, boil, steam, grill, roast on a rack, stir-fry, or microwave it. Other fat reducing suggestions include:  Remove the skin from poultry.  Remove all visible fat from meats.  Skim the fat off stews, soups, and gravies before serving them.  Steam vegetables in water or broth instead of sauting them in fat.  Avoid foods with trans fat (or hydrogenated oils), such as commercially fried foods and commercially baked goods. Commercial shortening and deep-frying fats will contain trans fat.  Increase intake of  fruits, vegetables, whole grains, and legumes to replace foods high in fat.  Increase consumption of nuts, legumes, and seeds to at least 4 servings weekly. One serving of a legume equals  cup, and 1 serving of nuts or seeds equals  cup.  Choose whole grains more often. Have 3 servings per day (a serving is 1 ounce [oz]).  Eat 4 to 5 servings of vegetables per day. A serving of vegetables is 1 cup of raw leafy vegetables;  cup of raw or cooked cut-up vegetables;  cup of vegetable juice.  Eat 4 to 5 servings of fruit per day. A serving of fruit is 1 medium whole fruit;  cup of dried fruit;  cup of fresh, frozen, or canned fruit;  cup of 100% fruit juice.  Increase your intake of dietary fiber to 20 to 30 grams per day. Insoluble fiber may help lower your risk of heart disease and may help curb your appetite.  Soluble fiber binds cholesterol to be removed from the blood. Foods high in soluble fiber are dried beans, citrus fruits, oats, apples, bananas, broccoli, Brussels sprouts, and eggplant.  Try to include foods fortified with plant sterols or stanols, such as yogurt, breads, juices, or margarines. Choose several fortified foods to achieve a daily intake of 2 to 3 grams of plant sterols or stanols.  Foods with omega-3 fats can help reduce your risk of heart disease. Aim to have a 3.5 oz portion of fatty fish twice per week, such as salmon, mackerel, albacore tuna, sardines, lake trout, or herring. If you wish to take a fish oil  supplement, choose one that contains 1 gram of both DHA and EPA.  Limit processed meats to 2 servings (3 oz portion) weekly.  Limit the sodium in your diet to 1500 milligrams (mg) per day. If you have high blood pressure, talk to a registered dietitian about a DASH (Dietary Approaches to Stop Hypertension) eating plan.  Limit sweets and beverages with added sugar, such as soda, to no more than 5 servings per week. One serving is:   1 tablespoon sugar.  1  tablespoon jelly or jam.   cup sorbet.  1 cup lemonade.   cup regular soda. CHOOSING FOODS Starches  Allowed: Breads: All kinds (wheat, rye, raisin, white, oatmeal, [place], [place], and English muffin bread). Low-fat rolls: English muffins, frankfurter and hamburger buns, bagels, pita bread, tortillas (not fried). Pancakes, waffles, biscuits, and muffins made with recommended oil.  Avoid: Products made with saturated or trans fats, oils, or whole milk products. Butter rolls, cheese breads, croissants. Commercial doughnuts, muffins, sweet rolls, biscuits, waffles, pancakes, store-bought mixes. Crackers  Allowed: Low-fat crackers and snacks: Animal, graham, rye, saltine (with recommended oil, no lard), oyster, and matzo crackers. Bread sticks, melba toast, rusks, flatbread, pretzels, and light popcorn.  Avoid: High-fat crackers: cheese crackers, butter crackers, and those made with coconut, palm oil, or trans fat (hydrogenated oils). Buttered popcorn. Cereals  Allowed: Hot or cold whole-grain cereals.  Avoid: Cereals containing coconut, hydrogenated vegetable fat, or animal fat. Potatoes / Pasta / Rice  Allowed: All kinds of potatoes, rice, and pasta (such as macaroni, spaghetti, and noodles).  Avoid: Pasta or rice prepared with cream sauce or high-fat cheese. Chow mein noodles, [place] fries. Vegetables  Allowed: All vegetables and vegetable juices.  Avoid: Fried vegetables. Vegetables in cream, butter, or high-fat cheese sauces. Limit coconut. Fruit in cream or custard. Protein  Allowed: Limit your intake of meat, seafood, and poultry to no more than 6 oz (cooked weight) per day. All lean, well-trimmed beef, veal, pork, and lamb. All chicken and [place] without skin. All fish and shellfish. Wild game: wild duck, rabbit, pheasant, and venison. Egg whites or low-cholesterol egg substitutes may be used as desired. Meatless dishes: recipes with dried beans, peas, lentils, and tofu  (soybean curd). Seeds and nuts: all seeds and most nuts.  Avoid: Prime grade and other heavily marbled and fatty meats, such as short ribs, spare ribs, rib eye roast or steak, frankfurters, sausage, bacon, and high-fat luncheon meats, mutton. Caviar. Commercially fried fish. Domestic duck, goose, venison sausage. Organ meats: liver, gizzard, heart, chitterlings, brains, kidney, sweetbreads. Dairy  Allowed: Low-fat cheeses: nonfat or low-fat cottage cheese (1% or 2% fat), cheeses made with part skim milk, such as mozzarella, farmers, string, or ricotta. (Cheeses should be labeled no more than 2 to 6 grams fat per oz.). Skim (or 1%) milk: liquid, powdered, or evaporated. Buttermilk made with low-fat milk. Drinks made with skim or low-fat milk or cocoa. Chocolate milk or cocoa made with skim or low-fat (1%) milk. Nonfat or low-fat yogurt.  Avoid: Whole milk cheeses, including colby, cheddar, mu[place]ster, Monterey Jack, [place], [place], [place], American, Swiss, and blue. Creamed cottage cheese, cream cheese. Whole milk and whole milk products, including buttermilk or yogurt made from whole milk, drinks made from whole milk. Condensed milk, evaporated whole milk, and 2% milk. Soups and Combination Foods  Allowed: Low-fat low-sodium soups: broth, dehydrated soups, homemade broth, soups with the fat removed, homemade cream soups made with skim or low-fat milk. Low-fat spaghetti, lasagna, chili, and Spanish rice  if low-fat ingredients and low-fat cooking techniques are used.  Avoid: Cream soups made with whole milk, cream, or high-fat cheese. All other soups. Desserts and Sweets  Allowed: Sherbet, fruit ices, gelatins, meringues, and angel food cake. Homemade desserts with recommended fats, oils, and milk products. Jam, jelly, honey, marmalade, sugars, and syrups. Pure sugar candy, such as gum drops, hard candy, jelly beans, marshmallows, mints, and small amounts of dark chocolate.  Avoid: Commercially  prepared cakes, pies, cookies, frosting, pudding, or mixes for these products. Desserts containing whole milk products, chocolate, coconut, lard, palm oil, or palm kernel oil. Ice cream or ice cream drinks. Candy that contains chocolate, coconut, butter, hydrogenated fat, or unknown ingredients. Buttered syrups. Fats and Oils  Allowed: Vegetable oils: safflower, sunflower, corn, soybean, cottonseed, sesame, canola, olive, or peanut. Non-hydrogenated margarines. Salad dressing or mayonnaise: homemade or commercial, made with a recommended oil. Low or nonfat salad dressing or mayonnaise.  Limit added fats and oils to 6 to 8 tsp per day (includes fats used in cooking, baking, salads, and spreads on bread). Remember to count the "hidden fats" in foods.  Avoid: Solid fats and shortenings: butter, lard, salt pork, bacon drippings. Gravy containing meat fat, shortening, or suet. Cocoa butter, coconut. Coconut oil, palm oil, palm kernel oil, or hydrogenated oils: these ingredients are often used in bakery products, nondairy creamers, whipped toppings, candy, and commercially fried foods. Read labels carefully. Salad dressings made of unknown oils, sour cream, or cheese, such as blue cheese and Roquefort. Cream, all kinds: half-and-half, light, heavy, or whipping. Sour cream or cream cheese (even if "light" or low-fat). Nondairy cream substitutes: coffee creamers and sour cream substitutes made with palm, palm kernel, hydrogenated oils, or coconut oil. Beverages  Allowed: Coffee (regular or decaffeinated), tea. Diet carbonated beverages, mineral water. Alcohol: Check with your caregiver. Moderation is recommended.  Avoid: Whole milk, regular sodas, and juice drinks with added sugar. Condiments  Allowed: All seasonings and condiments. Cocoa powder. "Cream" sauces made with recommended ingredients.  Avoid: Carob powder made with hydrogenated fats. SAMPLE MENU Breakfast   cup orange juice   cup  oatmeal  1 slice toast  1 tsp margarine  1 cup skim milk Lunch  [place] sandwich with 2 oz [place], 2 slices bread  Lettuce and tomato slices  Fresh fruit  Carrot sticks  Coffee or tea Snack  Fresh fruit or low-fat crackers Dinner  3 oz lean ground beef  1 baked potato  1 tsp margarine   cup asparagus  Lettuce salad  1 tbs non-creamy dressing   cup peach slices  1 cup skim milk Document Released: 02/24/2008 Document Revised: 11/16/2011 Document Reviewed: 07/17/2013 [employer] Patient Information 2015 [place], [place]. This information is not intended to replace advice given to you by your health care provider. Make sure you discuss any questions you have with your health care provider. Health Maintenance Adopting a healthy lifestyle and getting preventive care can go a long way to promote health and wellness. Talk with your health care provider about what schedule of regular examinations is right for you. This is a good chance for you to check in with your provider about disease prevention and staying healthy. In between checkups, there are plenty of things you can do on your own. Experts have done a lot of research about which lifestyle changes and preventive measures are most likely to keep you healthy. Ask your health care provider for more information. WEIGHT AND DIET  Eat a healthy diet  Be  sure to include plenty of vegetables, fruits, low-fat dairy products, and lean protein.  Do not eat a lot of foods high in solid fats, added sugars, or salt.  Get regular exercise. This is one of the most important things you can do for your health.  Most adults should exercise for at least 150 minutes each week. The exercise should increase your heart rate and make you sweat (moderate-intensity exercise).  Most adults should also do strengthening exercises at least twice a week. This is in addition to the moderate-intensity exercise.  Maintain a healthy weight  Body  mass index (BMI) is a measurement that can be used to identify possible weight problems. It estimates body fat based on height and weight. Your health care provider can help determine your BMI and help you achieve or maintain a healthy weight.  For females 59 years of age and older:   A BMI below 18.5 is considered underweight.  A BMI of 18.5 to 24.9 is normal.  A BMI of 25 to 29.9 is considered overweight.  A BMI of 30 and above is considered obese.  Watch levels of cholesterol and blood lipids  You should start having your blood tested for lipids and cholesterol at 65 years of age, then have this test every 5 years.  You may need to have your cholesterol levels checked more often if:  Your lipid or cholesterol levels are high.  You are older than 65 years of age.  You are at high risk for heart disease.  CANCER SCREENING   Lung Cancer  Lung cancer screening is recommended for adults 67-24 years old who are at high risk for lung cancer because of a history of smoking.  A yearly low-dose CT scan of the lungs is recommended for people who:  Currently smoke.  Have quit within the past 15 years.  Have at least a 30-pack-year history of smoking. A pack year is smoking an average of one pack of cigarettes a day for 1 year.  Yearly screening should continue until it has been 15 years since you quit.  Yearly screening should stop if you develop a health problem that would prevent you from having lung cancer treatment.  Breast Cancer  Practice breast self-awareness. This means understanding how your breasts normally appear and feel.  It also means doing regular breast self-exams. Let your health care provider know about any changes, no matter how small.  If you are in your 20s or 30s, you should have a clinical breast exam (CBE) by a health care provider every 1-3 years as part of a regular health exam.  If you are 23 or older, have a CBE every year. Also consider having a  breast X-ray (mammogram) every year.  If you have a family history of breast cancer, talk to your health care provider about genetic screening.  If you are at high risk for breast cancer, talk to your health care provider about having an MRI and a mammogram every year.  Breast cancer gene (BRCA) assessment is recommended for women who have family members with BRCA-related cancers. BRCA-related cancers include:  Breast.  Ovarian.  Tubal.  Peritoneal cancers.  Results of the assessment will determine the need for genetic counseling and BRCA1 and BRCA2 testing. Cervical Cancer Routine pelvic examinations to screen for cervical cancer are no longer recommended for nonpregnant women who are considered low risk for cancer of the pelvic organs (ovaries, uterus, and vagina) and who do not have symptoms. A pelvic  examination may be necessary if you have symptoms including those associated with pelvic infections. Ask your health care provider if a screening pelvic exam is right for you.   The Pap test is the screening test for cervical cancer for women who are considered at risk.  If you had a hysterectomy for a problem that was not cancer or a condition that could lead to cancer, then you no longer need Pap tests.  If you are older than 65 years, and you have had normal Pap tests for the past 10 years, you no longer need to have Pap tests.  If you have had past treatment for cervical cancer or a condition that could lead to cancer, you need Pap tests and screening for cancer for at least 20 years after your treatment.  If you no longer get a Pap test, assess your risk factors if they change (such as having a new sexual partner). This can affect whether you should start being screened again.  Some women have medical problems that increase their chance of getting cervical cancer. If this is the case for you, your health care provider may recommend more frequent screening and Pap tests.  The  human papillomavirus (HPV) test is another test that may be used for cervical cancer screening. The HPV test looks for the virus that can cause cell changes in the cervix. The cells collected during the Pap test can be tested for HPV.  The HPV test can be used to screen women 58 years of age and older. Getting tested for HPV can extend the interval between normal Pap tests from three to five years.  An HPV test also should be used to screen women of any age who have unclear Pap test results.  After 65 years of age, women should have HPV testing as often as Pap tests.  Colorectal Cancer  This type of cancer can be detected and often prevented.  Routine colorectal cancer screening usually begins at 65 years of age and continues through 65 years of age.  Your health care provider may recommend screening at an earlier age if you have risk factors for colon cancer.  Your health care provider may also recommend using home test kits to check for hidden blood in the stool.  A small camera at the end of a tube can be used to examine your colon directly (sigmoidoscopy or colonoscopy). This is done to check for the earliest forms of colorectal cancer.  Routine screening usually begins at age 44.  Direct examination of the colon should be repeated every 5-10 years through 65 years of age. However, you may need to be screened more often if early forms of precancerous polyps or small growths are found. Skin Cancer  Check your skin from head to toe regularly.  Tell your health care provider about any new moles or changes in moles, especially if there is a change in a mole's shape or color.  Also tell your health care provider if you have a mole that is larger than the size of a pencil eraser.  Always use sunscreen. Apply sunscreen liberally and repeatedly throughout the day.  Protect yourself by wearing long sleeves, pants, a wide-brimmed hat, and sunglasses whenever you are outside. HEART  DISEASE, DIABETES, AND HIGH BLOOD PRESSURE   Have your blood pressure checked at least every 1-2 years. High blood pressure causes heart disease and increases the risk of stroke.  If you are between 42 years and 32 years old, ask  your health care provider if you should take aspirin to prevent strokes.  Have regular diabetes screenings. This involves taking a blood sample to check your fasting blood sugar level.  If you are at a normal weight and have a low risk for diabetes, have this test once every three years after 65 years of age.  If you are overweight and have a high risk for diabetes, consider being tested at a younger age or more often. PREVENTING INFECTION  Hepatitis B  If you have a higher risk for hepatitis B, you should be screened for this virus. You are considered at high risk for hepatitis B if:  You were born in a country where hepatitis B is common. Ask your health care provider which countries are considered high risk.  Your parents were born in a high-risk country, and you have not been immunized against hepatitis B (hepatitis B vaccine).  You have HIV or AIDS.  You use needles to inject street drugs.  You live with someone who has hepatitis B.  You have had sex with someone who has hepatitis B.  You get hemodialysis treatment.  You take certain medicines for conditions, including cancer, organ transplantation, and autoimmune conditions. Hepatitis C  Blood testing is recommended for:  Everyone born from [birth year] through 1967.  Anyone with known risk factors for hepatitis C. Sexually transmitted infections (STIs)  You should be screened for sexually transmitted infections (STIs) including gonorrhea and chlamydia if:  You are sexually active and are younger than 65 years of age.  You are older than 65 years of age and your health care provider tells you that you are at risk for this type of infection.  Your sexual activity has changed since you were last  screened and you are at an increased risk for chlamydia or gonorrhea. Ask your health care provider if you are at risk.  If you do not have HIV, but are at risk, it may be recommended that you take a prescription medicine daily to prevent HIV infection. This is called pre-exposure prophylaxis (PrEP). You are considered at risk if:  You are sexually active and do not regularly use condoms or know the HIV status of your partner(s).  You take drugs by injection.  You are sexually active with a partner who has HIV. Talk with your health care provider about whether you are at high risk of being infected with HIV. If you choose to begin PrEP, you should first be tested for HIV. You should then be tested every 3 months for as long as you are taking PrEP.  PREGNANCY   If you are premenopausal and you may become pregnant, ask your health care provider about preconception counseling.  If you may become pregnant, take 400 to 800 micrograms (mcg) of folic acid every day.  If you want to prevent pregnancy, talk to your health care provider about birth control (contraception). OSTEOPOROSIS AND MENOPAUSE   Osteoporosis is a disease in which the bones lose minerals and strength with aging. This can result in serious bone fractures. Your risk for osteoporosis can be identified using a bone density scan.  If you are 63 years of age or older, or if you are at risk for osteoporosis and fractures, ask your health care provider if you should be screened.  Ask your health care provider whether you should take a calcium or vitamin D supplement to lower your risk for osteoporosis.  Menopause may have certain physical symptoms and risks.  Hormone  replacement therapy may reduce some of these symptoms and risks. Talk to your health care provider about whether hormone replacement therapy is right for you.  HOME CARE INSTRUCTIONS   Schedule regular health, dental, and eye exams.  Stay current with your  immunizations.   Do not use any tobacco products including cigarettes, chewing tobacco, or electronic cigarettes.  If you are pregnant, do not drink alcohol.  If you are breastfeeding, limit how much and how often you drink alcohol.  Limit alcohol intake to no more than 1 drink per day for nonpregnant women. One drink equals 12 ounces of beer, 5 ounces of wine, or 1 ounces of hard liquor.  Do not use street drugs.  Do not share needles.  Ask your health care provider for help if you need support or information about quitting drugs.  Tell your health care provider if you often feel depressed.  Tell your health care provider if you have ever been abused or do not feel safe at home. Document Released: 11/30/2010 Document Revised: 10/01/2013 Document Reviewed: 04/18/2013 [facility] Patient Information 2015 [place], [place]. This information is not intended to replace advice given to you by your health care provider. Make sure you discuss any questions you have with your health care provider.

## 2014-01-24 ENCOUNTER — Encounter: Payer: Self-pay | Admitting: Internal Medicine

## 2014-05-08 ENCOUNTER — Ambulatory Visit (INDEPENDENT_AMBULATORY_CARE_PROVIDER_SITE_OTHER): Payer: Medicare Other | Admitting: Family Medicine

## 2014-05-08 VITALS — BP 140/90 | HR 74 | Temp 98.1°F | Resp 16 | Ht 65.5 in | Wt 162.2 lb

## 2014-05-08 DIAGNOSIS — S46011A Strain of muscle(s) and tendon(s) of the rotator cuff of right shoulder, initial encounter: Secondary | ICD-10-CM

## 2014-05-08 DIAGNOSIS — L989 Disorder of the skin and subcutaneous tissue, unspecified: Secondary | ICD-10-CM

## 2014-05-08 DIAGNOSIS — R05 Cough: Secondary | ICD-10-CM

## 2014-05-08 DIAGNOSIS — I2510 Atherosclerotic heart disease of native coronary artery without angina pectoris: Secondary | ICD-10-CM

## 2014-05-08 DIAGNOSIS — J441 Chronic obstructive pulmonary disease with (acute) exacerbation: Secondary | ICD-10-CM

## 2014-05-08 DIAGNOSIS — R059 Cough, unspecified: Secondary | ICD-10-CM

## 2014-05-08 MED ORDER — DOXYCYCLINE HYCLATE 100 MG PO CAPS: 100.0000 mg | ORAL_CAPSULE | Freq: Two times a day (BID) | ORAL | Status: DC

## 2014-05-08 MED ORDER — PREDNISONE 20 MG PO TABS: ORAL_TABLET | ORAL | Status: DC

## 2014-05-08 NOTE — Patient Instructions (Signed)
We are going to treat you today for a COPD exacerbation (bronchitis type) with prednisone and doxycycline.  Also, the doxycycline should help if there is any infection causing your skin lesions.  However if these lesions do NOT heal I would recommend that you have a biopsy   Please do continue to work on quitting smoking or at least try to cut back.  Try the [place] "quitline" for support  Let me know if you do not feel better!   Recently, a new recommendation has been made regarding screening for lung cancer using annual "low dose" CT scanning.  This service is recommended for people who are 6255- 65 years old, who currently smoke or quit in the last 15 years, and who smoked at least a pack per day for 30 years or more.    Some patients who are at least 65 years old and who smoked a pack per day for 20 years, or were exposed to second hand smoke may also qualify for screening.    In [place] this service is available at [facility], [phone]. The exam costs about $300 but may be covered by insurance.

## 2014-05-08 NOTE — Progress Notes (Signed)
[facility] [address] [place] [address] [phone]  Date:  05/08/2014   Name:  Tara Cruz   DOB:  November 26, 1950   MRN:  [id number]  PCP:  Rogelia Boga, MD    Chief Complaint: spots on back of arm and Cough   History of Present Illness:  Tara Cruz is a 63 y.o. very pleasant female patient who presents with the following:  Here today as a new patient with illness.  She has noted a non- healing skin lesion on the back of her right arm for about 3 months. It tends to "ooze," and she does not have any other skin lesions.  She wonders if this could be due to some sort of infection  Also, she has coughed a lot over the last couple of months. She is coughing up clear mucus.  She will have some coughing fits. She does have a history of asthma and has been wheezing but not more than usual.  She is on advair and xopenex.  This cough seems "different" than her usual cough.  As a long term smoker she has generally coughed in the morning.   Also, her right shoulder has ached in the anterior shoulder over the last week.  She is not aware of any injury,  No relationship to exercise.    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  "I have tried everything to quit."    Patient Active Problem List   Diagnosis Date Noted  . Chest pain, abnormal stress test, cardiac cath with single vessel diag disease, too small to intervene. 02/06/2013  . CAD (coronary artery disease) 01/30/2013  . Right bundle branch block 01/19/2013  . Hip pain, bilateral 12/11/2010  . BARTHOLIN'S CYST, RECURRENT 11/27/2008  . ADJUSTMENT DISORDER WITH DEPRESSED MOOD 09/27/2008  . OVERACTIVE BLADDER 09/27/2008  . ASTHMA, WITH ACUTE EXACERBATION 09/26/2008  . DYSPNEA 09/26/2008  . DEGENERATIVE JOINT DISEASE, CERVICAL SPINE 02/27/2008  . Smoker 08/17/2007  . HYPERTENSION 08/17/2007  . HYPERLIPIDEMIA 02/25/2007  . ALLERGIC  RHINITIS 02/25/2007  . ASTHMA 02/25/2007  . COPD GOLD I, still smoking  02/25/2007    Past Medical History  Diagnosis Date  . Allergy   . Hyperlipidemia   . Asthma   . COPD (chronic obstructive pulmonary disease)   . Hypertension   . Right bundle branch block   . Abnormal stress test 01/03/2013    false positive by Dr. Allyson Sabal  . CAD (coronary artery disease) 01/30/2013    single vessel disease of small second diagonal branch, normal EF 65%  . Tobacco use     has tried to stop numerous times    Past Surgical History  Procedure Laterality Date  . Abdominal hysterectomy    . Foot surgery    . Cardiac catheterization  01/30/2013    2nd small diag branch with 95% stentosis, too small for intervention.  No disease in other vessels.    History  Substance Use Topics  . Smoking status: Current Every Day Smoker -- 0.75 packs/day for 50 years    Types: Cigarettes  . Smokeless tobacco: Never Used  . Alcohol Use: No    Family History  Problem Relation Age of Onset  . Coronary artery disease Mother   . Alzheimer's disease Mother   . Coronary artery disease Father   . Breast cancer Sister   . Rheum arthritis Mother   .  Asthma Sister     No Known Allergies  Medication list has been reviewed and updated.  Current Outpatient Prescriptions on File Prior to Visit  Medication Sig Dispense Refill  . aspirin 81 MG tablet Take 81 mg by mouth daily.      . Calcium Carbonate-Vitamin D (CALCIUM + D PO) Take 1 tablet by mouth daily.    . celecoxib (CELEBREX) 200 MG capsule 1 tab twice a day 180 capsule 1  . cetirizine (ZYRTEC) 10 MG tablet Take 10 mg by mouth daily as needed.    Marland Kitchen. estradiol (ESTRACE) 0.5 MG tablet Take 1 tablet (0.5 mg total) by mouth daily. 90 tablet 1  . ezetimibe-simvastatin (VYTORIN) 10-20 MG per tablet Take 1 tablet by mouth daily. 90 tablet 1  . fluticasone (FLONASE) 50 MCG/ACT nasal spray Place 2 sprays into both nostrils daily as needed. For nasal congestion 48 g 3   . Glucosamine HCl (GLUCOSAMINE PO) Take 600 capsules by mouth at bedtime.     . levalbuterol (XOPENEX HFA) 45 MCG/ACT inhaler Inhale 2 puffs into the lungs every 4 (four) hours as needed for wheezing.    Marland Kitchen. zolpidem (AMBIEN CR) 12.5 MG CR tablet take 1 tablet by mouth at bedtime if needed for sleep 30 tablet 5  . lidocaine (XYLOCAINE) 5 % ointment Apply 1 application topically as needed. Anesthetic    . predniSONE (DELTASONE) 10 MG tablet Take 1 tablet (10 mg total) by mouth 2 (two) times daily with a meal. (Patient not taking: Reported on 05/08/2014) 20 tablet 0  . [DISCONTINUED] DETROL LA 2 MG 24 hr capsule TAKE 1 CAPSULE DAILY 90 capsule 2   No current facility-administered medications on file prior to visit.    Review of Systems:  As per HPI- otherwise negative.   Physical Examination: Filed Vitals:   05/08/14 1141  BP: 140/90  Pulse: 74  Temp: 98.1 F (36.7 C)  Resp: 16   Filed Vitals:   05/08/14 1141  Height: 5' 5.5" (1.664 m)  Weight: 162 lb 3.2 oz (73.573 kg)   Body mass index is 26.57 kg/(m^2). Ideal Body Weight: Weight in (lb) to have BMI = 25: 152.2  GEN: WDWN, NAD, Non-toxic, A & O x 3, appearance of a chronic heavy smoker.   HEENT: Atraumatic, Normocephalic. Neck supple. No masses, No LAD.  Bilateral TM wnl, oropharynx normal.  PEERL,EOMI.   Ears and Nose: No external deformity. CV: RRR, No M/G/R. No JVD. No thrill. No extra heart sounds. PULM: CTA B, no wheezes, crackles, rhonchi. No retractions. No resp. distress. No accessory muscle use. Chronic mild cough in room ABD: S, NT, ND EXTR: No c/c/e NEURO Normal gait.  PSYCH: Normally interactive. Conversant. Not depressed or anxious appearing.  Calm demeanor.  Right arm: 2 small scabbed areas on the posterior upper arm which appear most consistent with pickers nodules.    Right shoulder: she has mild tenderness over the anterior RCT insertion and pain with internal and external rotation.  Flexion and abduction  ok.    Assessment and Plan: COPD exacerbation - Plan: doxycycline (VIBRAMYCIN) 100 MG capsule, predniSONE (DELTASONE) 20 MG tablet  Cough - Plan: predniSONE (DELTASONE) 20 MG tablet  Rotator cuff strain, right, initial encounter  Skin lesion of right arm - Plan: doxycycline (VIBRAMYCIN) 100 MG capsule  Discussed her likely COPD exacerbation.  She understands that quitting smoking is essential but so far has not been able to do so; she will keep trying Doxycycline and prednisone as above  Went over basic RCT exercises and gave a theraband If skin lesions do not resolve recommended bx  Meds ordered this encounter  Medications  . doxycycline (VIBRAMYCIN) 100 MG capsule    Sig: Take 1 capsule (100 mg total) by mouth 2 (two) times daily.    Dispense:  20 capsule    Refill:  0  . predniSONE (DELTASONE) 20 MG tablet    Sig: Take 2 pills a day for 3 days then 1 pill a day for 3 days    Dispense:  9 tablet    Refill:  0     Signed Abbe AmsterdamJessica Arlisha Patalano, MD

## 2014-05-09 ENCOUNTER — Encounter (HOSPITAL_COMMUNITY): Payer: Self-pay | Admitting: Cardiovascular Disease

## 2014-06-06 ENCOUNTER — Telehealth: Payer: Medicare Other | Admitting: Internal Medicine

## 2014-06-06 ENCOUNTER — Telehealth: Payer: Self-pay | Admitting: Internal Medicine

## 2014-06-06 DIAGNOSIS — F17200 Nicotine dependence, unspecified, uncomplicated: Secondary | ICD-10-CM

## 2014-06-06 DIAGNOSIS — Z129 Encounter for screening for malignant neoplasm, site unspecified: Secondary | ICD-10-CM

## 2014-06-06 MED ORDER — ZOLPIDEM TARTRATE ER 12.5 MG PO TBCR
EXTENDED_RELEASE_TABLET | ORAL | Status: DC
Start: 2014-06-06 — End: 2014-06-17

## 2014-06-06 NOTE — Telephone Encounter (Signed)
[facility] and [facility] advised pt since she has smoked for 50 yrs, she should get CTscan .  Pt would like to know if you will put in referral and see if /how much, insurance will pay.  ALSO, Pt [facility] flu [facility]. Now her right arm is burning, swollen and hurts from elbow to shoulder. No relief from aleve..  She cannot raise it up. pls advise. Pt will be available after 3:15 pm.

## 2014-06-06 NOTE — Telephone Encounter (Signed)
Left detailed message Rx called into pharmacy as requested. 

## 2014-06-06 NOTE — Telephone Encounter (Signed)
Please see message and advise 

## 2014-06-06 NOTE — Telephone Encounter (Signed)
Pt request refill zolpidem (AMBIEN CR) 12.5 MG CR tablet pls [facility] to [facility] [address]

## 2014-06-06 NOTE — Telephone Encounter (Signed)
Okay for low-dose/lung cancer screening chest CT

## 2014-06-07 ENCOUNTER — Other Ambulatory Visit: Payer: Self-pay | Admitting: *Deleted

## 2014-06-07 MED ORDER — FLUTICASONE-SALMETEROL 250-50 MCG/DOSE IN AEPB: 1.0000 | INHALATION_SPRAY | Freq: Every day | RESPIRATORY_TRACT | Status: DC

## 2014-06-07 MED ORDER — LEVALBUTEROL TARTRATE 45 MCG/ACT IN AERO: 2.0000 | INHALATION_SPRAY | RESPIRATORY_TRACT | Status: DC | PRN

## 2014-06-07 MED ORDER — CELECOXIB 200 MG PO CAPS: ORAL_CAPSULE | ORAL | Status: DC

## 2014-06-07 MED ORDER — EZETIMIBE-SIMVASTATIN 10-20 MG PO TABS: 1.0000 | ORAL_TABLET | Freq: Every day | ORAL | Status: DC

## 2014-06-07 MED ORDER — ESTRADIOL 0.5 MG PO TABS: 0.5000 mg | ORAL_TABLET | Freq: Every day | ORAL | Status: DC

## 2014-06-07 NOTE — Telephone Encounter (Signed)
Spoke to pt, told her I received the printed e-mail message from her regarding refills needed and I refilled all the medications except Advair due to not on list and I filled the Xopenex inhaler that was the one on your medication list. Pt said she has been taking Advair for a long time from Dr. Lovell SheehanJenkins but did not need any last year because she had extra. Told pt okay will send refill for Advair [facility]

## 2014-06-07 NOTE — Telephone Encounter (Signed)
Spoke to pt, told her order sent for CT scan and someone will be getting in touch with her regarding scheduling. Pt verbalized understanding. Asked pt how her right arm was? Pt stated she is able to lift it today but still hurts. Told pt to try Ibuprofen instead of Aleve and cold or warm compress whichever feels better. Pt verbalized understanding.

## 2014-06-14 ENCOUNTER — Other Ambulatory Visit: Payer: Self-pay | Admitting: *Deleted

## 2014-06-14 ENCOUNTER — Telehealth: Payer: Self-pay | Admitting: Internal Medicine

## 2014-06-14 MED ORDER — FLUTICASONE-SALMETEROL 250-50 MCG/DOSE IN AEPB: 1.0000 | INHALATION_SPRAY | Freq: Two times a day (BID) | RESPIRATORY_TRACT | Status: DC

## 2014-06-14 NOTE — Telephone Encounter (Addendum)
Pt would like to know why cat scan has not been ordered.

## 2014-06-17 MED ORDER — TRAZODONE HCL 50 MG PO TABS: 25.0000 mg | ORAL_TABLET | Freq: Every day | ORAL | Status: DC

## 2014-06-17 NOTE — Telephone Encounter (Signed)
Tara Cruz, pt called cause she has not been contacted about scheduling CT scan that I ordered on 1/8. Please schedule

## 2014-06-17 NOTE — Telephone Encounter (Signed)
Spoke to pt, told her I do not have results yet. Pt said no, I have not had it done yet, no one has called to schedule it. Told her okay I will send message to our referral coordinator who makes appt and someone should get in touch with you. Pt verbalized understanding. Also told pt I received fax from insurance that Zolpidem is not longer covered under your insurance. Pt said yes, she is aware. Told her will have to change to Trazodone will send new Rx to pharmacy. Pt verbalized understanding. Discussed Ambien  with Dr. Kirtland BouchardK, change to Trazodone 25 mg HS PRN.

## 2014-06-25 ENCOUNTER — Telehealth: Payer: Self-pay | Admitting: Internal Medicine

## 2014-06-25 ENCOUNTER — Other Ambulatory Visit: Payer: Self-pay | Admitting: Internal Medicine

## 2014-06-25 DIAGNOSIS — Z129 Encounter for screening for malignant neoplasm, site unspecified: Secondary | ICD-10-CM

## 2014-06-25 DIAGNOSIS — F17200 Nicotine dependence, unspecified, uncomplicated: Secondary | ICD-10-CM

## 2014-06-25 NOTE — Addendum Note (Signed)
Addended by: Alfred LevinsWYRICK, CINDY D on: 06/25/2014 11:29 AM   Modules accepted: Orders

## 2014-06-25 NOTE — Telephone Encounter (Signed)
Pt has not been scheduled or even contacted about ct scan and would like this week. Insurance deductible has been met.

## 2014-06-25 NOTE — Telephone Encounter (Signed)
Pt states her  celecoxib (CELEBREX) 200 MG is not covered under new plan.  Would like to know if formulary exemption has been done or another med considered? Thanks!

## 2014-06-25 NOTE — Telephone Encounter (Signed)
PA has been submitted. Ref# [id number]

## 2014-06-26 NOTE — Telephone Encounter (Signed)
Meloxicam 15 mg, #90 one daily

## 2014-06-26 NOTE — Telephone Encounter (Signed)
Please see message and advise 

## 2014-06-26 NOTE — Telephone Encounter (Signed)
PA for Celebrex was denied.  Patient must try and fail 3 of the formulary alternatives: Diclofenac potassium Diflunisal Etodolac Ibuprofen Ketoprofen Meloxicam Naproxen regular/delayed release OR there is/are specific medical reason(s) why the alternative medications are not appropriate to treat your medical condition.

## 2014-06-27 MED ORDER — MELOXICAM 15 MG PO TABS: 15.0000 mg | ORAL_TABLET | Freq: Every day | ORAL | Status: DC

## 2014-06-27 NOTE — Telephone Encounter (Signed)
Spoke to pt, told her Celebrex PA was denied and need to change to Meloxicam 15 mg one tablet daily. Asked pt where she wants it sent? Pt verbalized understanding and said please send to [facility]. Told pt okay. Rx sent.

## 2014-06-28 ENCOUNTER — Ambulatory Visit (INDEPENDENT_AMBULATORY_CARE_PROVIDER_SITE_OTHER)
Admission: RE | Admit: 2014-06-28 | Discharge: 2014-06-28 | Disposition: A | Discharge status: Home / Self Care | Payer: Medicare Other | Source: Ambulatory Visit | Attending: Internal Medicine | Admitting: Internal Medicine

## 2014-06-28 DIAGNOSIS — F17210 Nicotine dependence, cigarettes, uncomplicated: Secondary | ICD-10-CM

## 2014-06-28 DIAGNOSIS — Z122 Encounter for screening for malignant neoplasm of respiratory organs: Secondary | ICD-10-CM | POA: Diagnosis not present

## 2014-06-28 DIAGNOSIS — Z129 Encounter for screening for malignant neoplasm, site unspecified: Secondary | ICD-10-CM

## 2014-06-28 DIAGNOSIS — F17200 Nicotine dependence, unspecified, uncomplicated: Secondary | ICD-10-CM

## 2014-07-02 ENCOUNTER — Other Ambulatory Visit: Payer: Self-pay | Admitting: Physician Assistant

## 2014-08-12 ENCOUNTER — Telehealth: Payer: Self-pay | Admitting: Internal Medicine

## 2014-08-12 NOTE — Telephone Encounter (Signed)
PA for zolpidem was denied.  Patient must try and fail ALL of the following:  Belsomra and Rozerem OR give specific medical reason(s) why the alternative medications are not appropriate to treat the patient.

## 2014-08-13 NOTE — Telephone Encounter (Signed)
Noted  

## 2014-09-04 ENCOUNTER — Telehealth: Payer: Self-pay | Admitting: Internal Medicine

## 2014-09-04 NOTE — Telephone Encounter (Signed)
Patient called stating meloxicam is not working and would like to go back on celecoxib (CELEBREX) 200 MG capsule.  I read the original denial and patient needed to try and fail 3 alternatives; she has tried and failed meloxicam, naproxen and ibuprofen already.   She would [facility] [place] [place] - [address] So she can pick it up right away.  She would like the rest with re-fills [facility].    Once it is sent to [facility], it will create a PA and I can call to get it approved.  Please advise if this is ok.

## 2014-09-05 MED ORDER — CELECOXIB 200 MG PO CAPS: 200.0000 mg | ORAL_CAPSULE | Freq: Two times a day (BID) | ORAL | Status: DC

## 2014-09-05 NOTE — Telephone Encounter (Signed)
PA was approved till 05/31/15- [id number].  Patient is aware and mentioned the medication being a Tier 4.  I have submitted a tier exception request and it is pending clinical review. Ref [id number].

## 2014-09-05 NOTE — Telephone Encounter (Signed)
I called [facility] to verify medication goes through and was given the price $8.78. I called patient back and she is aware of cost and the medication will be ready for pick up today.  She would [facility] the [facility] with [facility].

## 2014-09-05 NOTE — Telephone Encounter (Signed)
Rx sent to [facility] for 30 day supply.

## 2014-09-05 NOTE — Telephone Encounter (Signed)
Rx sent to [facility]. 

## 2014-11-21 ENCOUNTER — Encounter: Payer: Self-pay | Admitting: Gastroenterology

## 2014-12-31 ENCOUNTER — Telehealth: Payer: Self-pay | Admitting: Internal Medicine

## 2014-12-31 NOTE — Telephone Encounter (Signed)
FYI pt is calling to let md know she stopped smoking in feb 2016

## 2015-01-01 ENCOUNTER — Telehealth: Payer: Self-pay | Admitting: Internal Medicine

## 2015-01-01 NOTE — Telephone Encounter (Signed)
Pt scheduled  

## 2015-01-01 NOTE — Telephone Encounter (Signed)
Yes, I received the paper. Pt needs an appointment for medical clearance for surgery. Please schedule appointment this week or next.

## 2015-01-01 NOTE — Telephone Encounter (Signed)
Pt call to verify if Dr Kirtland Bouchard received her medical clearance form. She is scheduled for surgery on 01/15/15

## 2015-01-06 ENCOUNTER — Ambulatory Visit (INDEPENDENT_AMBULATORY_CARE_PROVIDER_SITE_OTHER): Payer: Medicare Other | Admitting: Internal Medicine

## 2015-01-06 ENCOUNTER — Encounter: Payer: Self-pay | Admitting: Internal Medicine

## 2015-01-06 VITALS — BP 140/80 | HR 68 | Temp 98.2°F | Resp 20 | Ht 65.5 in | Wt 159.0 lb

## 2015-01-06 DIAGNOSIS — Z720 Tobacco use: Secondary | ICD-10-CM

## 2015-01-06 DIAGNOSIS — F17200 Nicotine dependence, unspecified, uncomplicated: Secondary | ICD-10-CM

## 2015-01-06 DIAGNOSIS — J449 Chronic obstructive pulmonary disease, unspecified: Secondary | ICD-10-CM

## 2015-01-06 DIAGNOSIS — I10 Essential (primary) hypertension: Secondary | ICD-10-CM | POA: Diagnosis not present

## 2015-01-06 DIAGNOSIS — I2583 Coronary atherosclerosis due to lipid rich plaque: Secondary | ICD-10-CM

## 2015-01-06 DIAGNOSIS — I2510 Atherosclerotic heart disease of native coronary artery without angina pectoris: Secondary | ICD-10-CM | POA: Diagnosis not present

## 2015-01-06 DIAGNOSIS — E785 Hyperlipidemia, unspecified: Secondary | ICD-10-CM

## 2015-01-06 MED ORDER — ZOLPIDEM TARTRATE 10 MG PO TABS: 5.0000 mg | ORAL_TABLET | Freq: Every evening | ORAL | Status: DC | PRN

## 2015-01-06 NOTE — Patient Instructions (Signed)
Limit your sodium (Salt) intake  Smoking tobacco is very bad for your health. You should stop smoking immediately.  Return for your annual exam as scheduled

## 2015-01-06 NOTE — Progress Notes (Signed)
Subjective:    Patient ID: Tara Cruz, female    DOB: [birth year], 64 y.o.   MRN: [id number]  HPI 64 year old patient who is seen today for medical clearance prior to anticipated lumbar decompressive laminectomy. She is followed by both cardiology and pulmonary medicine She has a history of single-vessel coronary artery disease which has been stable.  In March, before the onset of worsening back pain.  She was walking 2 miles daily without difficulty or any exertional chest pain.  Denies any dyspnea on exertion For a period of time,   She was abstinent from smoking, but has resumed smoking about 5 cigarettes daily.  Denies any pulmonary complaints.  Past Medical History  Diagnosis Date  . Allergy   . Hyperlipidemia   . Asthma   . COPD (chronic obstructive pulmonary disease)   . Hypertension   . Right bundle branch block   . Abnormal stress test 01/03/2013    false positive by Dr. Allyson Sabal  . CAD (coronary artery disease) 01/30/2013    single vessel disease of small second diagonal branch, normal EF 65%  . Tobacco use     has tried to stop numerous times    History   Social History  . Marital Status: Significant Other    Spouse Name: N/A  . Number of Children: N/A  . Years of Education: N/A   Occupational History  . Retired [employer]   Social History Main Topics  . Smoking status: Current Every Day Smoker -- 0.75 packs/day for 50 years    Types: Cigarettes  . Smokeless tobacco: Never Used  . Alcohol Use: No  . Drug Use: No  . Sexual Activity: Yes   Other Topics Concern  . Not on file   Social History Narrative    Past Surgical History  Procedure Laterality Date  . Abdominal hysterectomy    . Foot surgery    . Cardiac catheterization  01/30/2013    2nd small diag branch with 95% stentosis, too small for intervention.  No disease in other vessels.  . Left heart catheterization with coronary angiogram N/A 01/30/2013    Procedure: LEFT HEART CATHETERIZATION  WITH CORONARY ANGIOGRAM;  Surgeon: Runell Gess, MD;  Location: [facility] CATH LAB;  Service: Cardiovascular;  Laterality: N/A;    Family History  Problem Relation Age of Onset  . Coronary artery disease Mother   . Alzheimer's disease Mother   . Coronary artery disease Father   . Breast cancer Sister   . Rheum arthritis Mother   . Asthma Sister     No Known Allergies  Current Outpatient Prescriptions on File Prior to Visit  Medication Sig Dispense Refill  . aspirin 81 MG tablet Take 81 mg by mouth daily.      . Calcium Carbonate-Vitamin D (CALCIUM + D PO) Take 1 tablet by mouth daily.    . celecoxib (CELEBREX) 200 MG capsule Take 1 capsule (200 mg total) by mouth 2 (two) times daily. 180 capsule 3  . cetirizine (ZYRTEC) 10 MG tablet Take 10 mg by mouth daily as needed.    Marland Kitchen estradiol (ESTRACE) 0.5 MG tablet Take 1 tablet (0.5 mg total) by mouth daily. 90 tablet 3  . ezetimibe-simvastatin (VYTORIN) 10-20 MG per tablet Take 1 tablet by mouth daily. (Patient taking differently: Take 1 tablet by mouth every other day. ) 90 tablet 2  . fluticasone (FLONASE) 50 MCG/ACT nasal spray Place 2 sprays into both nostrils daily as needed. For nasal congestion  48 g 3  . Fluticasone-Salmeterol (ADVAIR DISKUS) 250-50 MCG/DOSE AEPB Inhale 1 puff into the lungs 2 (two) times daily. 3 each 3  . Glucosamine HCl (GLUCOSAMINE PO) Take 600 capsules by mouth at bedtime.     . levalbuterol (XOPENEX HFA) 45 MCG/ACT inhaler Inhale 2 puffs into the lungs every 4 (four) hours as needed for wheezing. 3 Inhaler 3  . lidocaine (XYLOCAINE) 5 % ointment Apply 1 application topically as needed. Anesthetic    . [DISCONTINUED] DETROL LA 2 MG 24 hr capsule TAKE 1 CAPSULE DAILY 90 capsule 2   No current facility-administered medications on file prior to visit.    BP 140/80 mmHg  Pulse 68  Temp(Src) 98.2 F (36.8 C) (Oral)  Resp 20  Ht 5' 5.5" (1.664 m)  Wt 159 lb (72.122 kg)  BMI 26.05 kg/m2  SpO2 97%     Review  of Systems  Constitutional: Negative.   HENT: Negative for congestion, dental problem, hearing loss, rhinorrhea, sinus pressure, sore throat and tinnitus.   Eyes: Negative for pain, discharge and visual disturbance.  Respiratory: Negative for cough and shortness of breath.   Cardiovascular: Negative for chest pain, palpitations and leg swelling.  Gastrointestinal: Negative for nausea, vomiting, abdominal pain, diarrhea, constipation, blood in stool and abdominal distention.  Genitourinary: Negative for dysuria, urgency, frequency, hematuria, flank pain, vaginal bleeding, vaginal discharge, difficulty urinating, vaginal pain and pelvic pain.  Musculoskeletal: Positive for back pain and arthralgias. Negative for joint swelling and gait problem.  Skin: Negative for rash.  Neurological: Negative for dizziness, syncope, speech difficulty, weakness, numbness and headaches.  Hematological: Negative for adenopathy.  Psychiatric/Behavioral: Negative for behavioral problems, dysphoric mood and agitation. The patient is not nervous/anxious.        Objective:   Physical Exam  Constitutional: She is oriented to person, place, and time. She appears well-developed and well-nourished.  HENT:  Head: Normocephalic.  Right Ear: External ear normal.  Left Ear: External ear normal.  Mouth/Throat: Oropharynx is clear and moist.  Eyes: Conjunctivae and EOM are normal. Pupils are equal, round, and reactive to light.  Neck: Normal range of motion. Neck supple. No thyromegaly present.  Cardiovascular: Normal rate, regular rhythm, normal heart sounds and intact distal pulses.   The left posterior tibial pulse diminished  Pulmonary/Chest: Effort normal and breath sounds normal.  Abdominal: Soft. Bowel sounds are normal. She exhibits no mass. There is no tenderness.  Musculoskeletal: Normal range of motion.  Lymphadenopathy:    She has no cervical adenopathy.  Neurological: She is alert and oriented to person,  place, and time.  Skin: Skin is warm and dry. No rash noted.  Psychiatric: She has a normal mood and affect. Her behavior is normal.          Assessment & Plan:   Hypertension, well-controlled Mild COPD.  Total smoking cessation encouraged Single-vessel coronary artery disease.  Remains asymptomatic.  Continue aggressive risk factor modification  No contraindications to anticipated surgery Patient will reschedule annual exam

## 2015-01-06 NOTE — Progress Notes (Signed)
Pre visit review using our clinic review tool, if applicable. No additional management support is needed unless otherwise documented below in the visit note. 

## 2015-01-09 ENCOUNTER — Encounter (HOSPITAL_COMMUNITY)
Admission: RE | Admit: 2015-01-09 | Discharge: 2015-01-09 | Disposition: A | Discharge status: Home / Self Care | Payer: Medicare Other | Source: Ambulatory Visit | Attending: Orthopedic Surgery | Admitting: Orthopedic Surgery

## 2015-01-09 ENCOUNTER — Encounter (HOSPITAL_COMMUNITY): Payer: Self-pay

## 2015-01-09 DIAGNOSIS — Z01812 Encounter for preprocedural laboratory examination: Secondary | ICD-10-CM | POA: Diagnosis present

## 2015-01-09 DIAGNOSIS — I4510 Unspecified right bundle-branch block: Secondary | ICD-10-CM | POA: Diagnosis not present

## 2015-01-09 DIAGNOSIS — M549 Dorsalgia, unspecified: Secondary | ICD-10-CM | POA: Diagnosis not present

## 2015-01-09 DIAGNOSIS — Z0183 Encounter for blood typing: Secondary | ICD-10-CM | POA: Diagnosis not present

## 2015-01-09 DIAGNOSIS — I498 Other specified cardiac arrhythmias: Secondary | ICD-10-CM | POA: Insufficient documentation

## 2015-01-09 DIAGNOSIS — M79606 Pain in leg, unspecified: Secondary | ICD-10-CM | POA: Insufficient documentation

## 2015-01-09 HISTORY — DX: Personal history of other medical treatment: Z92.89

## 2015-01-09 HISTORY — DX: Unspecified osteoarthritis, unspecified site: M19.90

## 2015-01-09 LAB — BASIC METABOLIC PANEL
Anion gap: 6 (ref 5–15)
BUN: 20 mg/dL (ref 6–20)
CO2: 30 mmol/L (ref 22–32)
CREATININE: 1.03 mg/dL — AB (ref 0.44–1.00)
Calcium: 9.8 mg/dL (ref 8.9–10.3)
Chloride: 107 mmol/L (ref 101–111)
GFR calc Af Amer: >60 mL/min (ref >60)
GFR, EST NON AFRICAN AMERICAN: 55 mL/min — AB (ref >60)
Glucose, Bld: 100 mg/dL — ABNORMAL HIGH (ref 65–99)
Potassium: 4.2 mmol/L (ref 3.5–5.1)
Sodium: 143 mmol/L (ref 135–145)

## 2015-01-09 LAB — CBC
HCT: 44.3 % (ref 36.0–46.0)
Hemoglobin: 14.3 g/dL (ref 12.0–15.0)
MCH: 31.6 pg (ref 26.0–34.0)
MCHC: 32.3 g/dL (ref 30.0–36.0)
MCV: 98 fL (ref 78.0–100.0)
Platelets: 281 10*3/uL (ref 150–400)
RBC: 4.52 MIL/uL (ref 3.87–5.11)
RDW: 14.6 % (ref 11.5–15.5)
WBC: 5 10*3/uL (ref 4.0–10.5)

## 2015-01-09 LAB — SURGICAL PCR SCREEN
MRSA, PCR: NEGATIVE
STAPHYLOCOCCUS AUREUS: NEGATIVE

## 2015-01-09 LAB — ABO/RH: ABO/RH(D): O NEG

## 2015-01-09 NOTE — Progress Notes (Signed)
Pt. Had numerous questions, relative to back brace,- length of surg. , length of stay, length of need for help at home, what to pack in suitcase, medicines, etc., etc. Pt. Will see Dr. Shon Baton on Saturday- pt. Urged to ask Dr. Shon Baton for guidance on these ?'s

## 2015-01-09 NOTE — Pre-Procedure Instructions (Signed)
Tara Cruz  01/09/2015      [facility] - [place], [place] - [address] [place] [address] Phone: [phone] Fax: [phone]  [facility] [address] - [place], [place] - [address] [place] [address] Phone: [phone] Fax: [phone]  [facility] - [place], [place] - [address] [place] [address] [place] [address] Phone: [phone] Fax: [phone]    Your procedure is scheduled on [facility]/2016[facility]  Report to [facility] Admitting at 6:30 A.M.  Call this number if you have problems the morning of surgery:  [phone]   Remember:  Do not eat food or drink liquids after midnight. Tuesday   Take these medicines the morning of surgery with A SIP OF WATER :  DETROL, ZYRTEC   Do not wear jewelry, make-up or nail polish.   Do not wear lotions, powders, or perfumes.  You may wear deodorant.   Do not shave 48 hours prior to surgery.    Do not bring valuables to the hospital.   [facility] is not responsible for any belongings or valuables.  Contacts, dentures or bridgework may not be worn into surgery.  Leave your suitcase in the car.  After surgery it may be brought to your room.  For patients admitted to the hospital, discharge time will be determined by your treatment team.  Patients discharged the day of surgery will not be allowed to drive home.   Name and phone number of your driver:   /w Dorene Sorrow Special instructions:  Special Instructions: [facility] - Preparing for Surgery  Before surgery, you can play an important role.  Because skin is not sterile, your skin needs to be as free of germs as possible.  You can reduce the number of germs on you skin by washing with CHG (chlorahexidine gluconate) soap before surgery.  CHG is an antiseptic cleaner which kills germs and bonds with the skin to continue killing germs  even after washing.  Please DO NOT use if you have an allergy to CHG or antibacterial soaps.  If your skin becomes reddened/irritated stop using the CHG and inform your nurse when you arrive at Short Stay.  Do not shave (including legs and underarms) for at least 48 hours prior to the first CHG shower.  You may shave your face.  Please follow these instructions carefully:   1.  Shower with CHG Soap the night before surgery and the  morning of Surgery.  2.  If you choose to wash your hair, wash your hair first as usual with your  normal shampoo.  3.  After you shampoo, rinse your hair and body thoroughly to remove the  Shampoo.  4.  Use CHG as you would any other liquid soap.  You can apply chg directly to the skin and wash gently with scrungie or a clean washcloth.  5.  Apply the CHG Soap to your body ONLY FROM THE NECK DOWN.    Do not use on open wounds or open sores.  Avoid contact with your eyes, ears, mouth and genitals (private parts).  Wash genitals (private parts)   with your normal soap.  6.  Wash thoroughly, paying special attention to the area where your surgery will be performed.  7.  Thoroughly rinse your body with warm water from the neck down.  8.  DO NOT shower/wash with your normal soap after using and  rinsing off   the CHG Soap.  9.  Pat yourself dry with a clean towel.            10.  Wear clean pajamas.            11.  Place clean sheets on your bed the night of your first shower and do not sleep with pets.  Day of Surgery  Do not apply any lotions/deodorants the morning of surgery.  Please wear clean clothes to the hospital/surgery center.  Please read over the following fact sheets that you were given. Pain Booklet, Coughing and Deep Breathing, Blood Transfusion Information, MRSA Information and Surgical Site Infection Prevention

## 2015-01-10 LAB — TYPE AND SCREEN
ABO/RH(D): O NEG
Antibody Screen: NEGATIVE

## 2015-01-14 MED ORDER — CEFAZOLIN SODIUM-DEXTROSE 2-3 GM-% IV SOLR
2.0000 g | INTRAVENOUS | Status: AC
  Administered 2015-01-15: 2 g via INTRAVENOUS
  Filled 2015-01-14: qty 50

## 2015-01-15 ENCOUNTER — Inpatient Hospital Stay (HOSPITAL_COMMUNITY): Payer: Medicare Other | Admitting: Certified Registered Nurse Anesthetist

## 2015-01-15 ENCOUNTER — Encounter (HOSPITAL_COMMUNITY)
Admission: AD | Disposition: A | Discharge status: Home / Self Care | Payer: Self-pay | Source: Ambulatory Visit | Attending: Orthopedic Surgery

## 2015-01-15 ENCOUNTER — Encounter (HOSPITAL_COMMUNITY): Payer: Self-pay | Admitting: Certified Registered Nurse Anesthetist

## 2015-01-15 ENCOUNTER — Inpatient Hospital Stay (HOSPITAL_COMMUNITY)
Admission: AD | Admit: 2015-01-15 | Discharge: 2015-01-16 | DRG: 460 | Disposition: A | Discharge status: Home / Self Care | Payer: Medicare Other | Source: Ambulatory Visit | Attending: Orthopedic Surgery | Admitting: Orthopedic Surgery

## 2015-01-15 ENCOUNTER — Inpatient Hospital Stay (HOSPITAL_COMMUNITY): Payer: Medicare Other

## 2015-01-15 DIAGNOSIS — Z7982 Long term (current) use of aspirin: Secondary | ICD-10-CM

## 2015-01-15 DIAGNOSIS — M7138 Other bursal cyst, other site: Secondary | ICD-10-CM | POA: Diagnosis present

## 2015-01-15 DIAGNOSIS — M549 Dorsalgia, unspecified: Secondary | ICD-10-CM | POA: Diagnosis present

## 2015-01-15 DIAGNOSIS — F17200 Nicotine dependence, unspecified, uncomplicated: Secondary | ICD-10-CM | POA: Diagnosis present

## 2015-01-15 DIAGNOSIS — Z79899 Other long term (current) drug therapy: Secondary | ICD-10-CM

## 2015-01-15 DIAGNOSIS — M1990 Unspecified osteoarthritis, unspecified site: Secondary | ICD-10-CM | POA: Diagnosis present

## 2015-01-15 DIAGNOSIS — J449 Chronic obstructive pulmonary disease, unspecified: Secondary | ICD-10-CM | POA: Diagnosis present

## 2015-01-15 DIAGNOSIS — Z419 Encounter for procedure for purposes other than remedying health state, unspecified: Secondary | ICD-10-CM

## 2015-01-15 DIAGNOSIS — M4310 Spondylolisthesis, site unspecified: Secondary | ICD-10-CM | POA: Diagnosis present

## 2015-01-15 DIAGNOSIS — Z981 Arthrodesis status: Secondary | ICD-10-CM

## 2015-01-15 DIAGNOSIS — I2510 Atherosclerotic heart disease of native coronary artery without angina pectoris: Secondary | ICD-10-CM | POA: Diagnosis present

## 2015-01-15 DIAGNOSIS — I10 Essential (primary) hypertension: Secondary | ICD-10-CM | POA: Diagnosis present

## 2015-01-15 DIAGNOSIS — E785 Hyperlipidemia, unspecified: Secondary | ICD-10-CM | POA: Diagnosis present

## 2015-01-15 DIAGNOSIS — F17210 Nicotine dependence, cigarettes, uncomplicated: Secondary | ICD-10-CM | POA: Diagnosis present

## 2015-01-15 SURGERY — POSTERIOR LUMBAR FUSION 1 LEVEL
Anesthesia: General | Site: Back

## 2015-01-15 MED ORDER — HYDROMORPHONE HCL 1 MG/ML IJ SOLN
INTRAMUSCULAR | Status: AC
  Filled 2015-01-15: qty 1

## 2015-01-15 MED ORDER — DEXAMETHASONE SODIUM PHOSPHATE 4 MG/ML IJ SOLN
INTRAMUSCULAR | Status: AC
  Filled 2015-01-15: qty 2

## 2015-01-15 MED ORDER — PROPOFOL 10 MG/ML IV BOLUS
INTRAVENOUS | Status: DC | PRN
  Administered 2015-01-15 (×3): 40 mg via INTRAVENOUS
  Administered 2015-01-15: 200 mg via INTRAVENOUS

## 2015-01-15 MED ORDER — PROPOFOL 10 MG/ML IV BOLUS
INTRAVENOUS | Status: AC
  Filled 2015-01-15: qty 20

## 2015-01-15 MED ORDER — ACETAMINOPHEN 10 MG/ML IV SOLN
INTRAVENOUS | Status: DC | PRN
  Administered 2015-01-15: 1000 mg via INTRAVENOUS

## 2015-01-15 MED ORDER — ONDANSETRON HCL 4 MG/2ML IJ SOLN: 4.0000 mg | INTRAMUSCULAR | Status: DC | PRN

## 2015-01-15 MED ORDER — KETAMINE HCL 100 MG/ML IJ SOLN
INTRAMUSCULAR | Status: AC
  Filled 2015-01-15: qty 1

## 2015-01-15 MED ORDER — KETAMINE HCL 100 MG/ML IJ SOLN: 500.0000 mg | INTRAMUSCULAR | Status: DC | PRN

## 2015-01-15 MED ORDER — SUCCINYLCHOLINE CHLORIDE 20 MG/ML IJ SOLN
INTRAMUSCULAR | Status: DC | PRN
  Administered 2015-01-15: 100 mg via INTRAVENOUS

## 2015-01-15 MED ORDER — GELATIN ABSORBABLE MT POWD
OROMUCOSAL | Status: DC | PRN
  Administered 2015-01-15: 20 mL via TOPICAL

## 2015-01-15 MED ORDER — FENTANYL CITRATE (PF) 250 MCG/5ML IJ SOLN
INTRAMUSCULAR | Status: AC
  Filled 2015-01-15: qty 5

## 2015-01-15 MED ORDER — LACTATED RINGERS IV SOLN
INTRAVENOUS | Status: DC | PRN
  Administered 2015-01-15 (×2): via INTRAVENOUS

## 2015-01-15 MED ORDER — PHENOL 1.4 % MT LIQD: 1.0000 | OROMUCOSAL | Status: DC | PRN

## 2015-01-15 MED ORDER — OXYCODONE-ACETAMINOPHEN 10-325 MG PO TABS: 1.0000 | ORAL_TABLET | ORAL | Status: DC | PRN

## 2015-01-15 MED ORDER — MENTHOL 3 MG MT LOZG: 1.0000 | LOZENGE | OROMUCOSAL | Status: DC | PRN

## 2015-01-15 MED ORDER — 0.9 % SODIUM CHLORIDE (POUR BTL) OPTIME
TOPICAL | Status: DC | PRN
  Administered 2015-01-15: 1000 mL

## 2015-01-15 MED ORDER — PROMETHAZINE HCL 25 MG/ML IJ SOLN
6.2500 mg | INTRAMUSCULAR | Status: DC | PRN
Start: 2015-01-15 — End: 2015-01-15

## 2015-01-15 MED ORDER — THROMBIN 20000 UNITS EX SOLR
CUTANEOUS | Status: AC
  Filled 2015-01-15: qty 20000

## 2015-01-15 MED ORDER — PROPOFOL INFUSION 10 MG/ML OPTIME
INTRAVENOUS | Status: DC | PRN
  Administered 2015-01-15: 100 ug/kg/min via INTRAVENOUS

## 2015-01-15 MED ORDER — MIDAZOLAM HCL 5 MG/5ML IJ SOLN
INTRAMUSCULAR | Status: DC | PRN
  Administered 2015-01-15: 2 mg via INTRAVENOUS

## 2015-01-15 MED ORDER — DEXAMETHASONE SODIUM PHOSPHATE 4 MG/ML IJ SOLN
INTRAMUSCULAR | Status: DC | PRN
  Administered 2015-01-15: 8 mg via INTRAVENOUS

## 2015-01-15 MED ORDER — MORPHINE SULFATE (PF) 2 MG/ML IV SOLN: 1.0000 mg | INTRAVENOUS | Status: DC | PRN

## 2015-01-15 MED ORDER — LACTATED RINGERS IV SOLN
INTRAVENOUS | Status: DC | PRN
  Administered 2015-01-15 (×2): via INTRAVENOUS

## 2015-01-15 MED ORDER — CEFAZOLIN SODIUM 1-5 GM-% IV SOLN
1.0000 g | Freq: Three times a day (TID) | INTRAVENOUS | Status: AC
  Administered 2015-01-15: 1 g via INTRAVENOUS
  Filled 2015-01-15 (×2): qty 50

## 2015-01-15 MED ORDER — SODIUM CHLORIDE 0.9 % IJ SOLN: 3.0000 mL | INTRAMUSCULAR | Status: DC | PRN

## 2015-01-15 MED ORDER — FENTANYL CITRATE (PF) 100 MCG/2ML IJ SOLN
INTRAMUSCULAR | Status: DC | PRN
  Administered 2015-01-15: 100 ug via INTRAVENOUS
  Administered 2015-01-15 (×3): 50 ug via INTRAVENOUS
  Administered 2015-01-15: 100 ug via INTRAVENOUS

## 2015-01-15 MED ORDER — DEXTROSE 5 % IV SOLN
500.0000 mg | Freq: Four times a day (QID) | INTRAVENOUS | Status: DC | PRN
  Filled 2015-01-15: qty 5

## 2015-01-15 MED ORDER — ALBUTEROL SULFATE HFA 108 (90 BASE) MCG/ACT IN AERS
INHALATION_SPRAY | RESPIRATORY_TRACT | Status: DC | PRN
  Administered 2015-01-15 (×3): 2 via RESPIRATORY_TRACT

## 2015-01-15 MED ORDER — ONDANSETRON HCL 4 MG/2ML IJ SOLN
INTRAMUSCULAR | Status: DC | PRN
  Administered 2015-01-15: 4 mg via INTRAVENOUS

## 2015-01-15 MED ORDER — SODIUM CHLORIDE 0.9 % IJ SOLN
3.0000 mL | Freq: Two times a day (BID) | INTRAMUSCULAR | Status: DC
  Administered 2015-01-15: 3 mL via INTRAVENOUS

## 2015-01-15 MED ORDER — OXYCODONE HCL 5 MG PO TABS
10.0000 mg | ORAL_TABLET | ORAL | Status: DC | PRN
  Administered 2015-01-15 – 2015-01-16 (×6): 10 mg via ORAL
  Filled 2015-01-15 (×6): qty 2

## 2015-01-15 MED ORDER — HYDROMORPHONE HCL 1 MG/ML IJ SOLN
INTRAMUSCULAR | Status: AC
  Administered 2015-01-15: 0.5 mg via INTRAVENOUS
  Filled 2015-01-15: qty 1

## 2015-01-15 MED ORDER — MAGNESIUM CITRATE PO SOLN
0.5000 | Freq: Once | ORAL | Status: AC
  Administered 2015-01-15: 0.5 via ORAL
  Filled 2015-01-15: qty 296

## 2015-01-15 MED ORDER — ACETAMINOPHEN 10 MG/ML IV SOLN
INTRAVENOUS | Status: AC
  Filled 2015-01-15: qty 100

## 2015-01-15 MED ORDER — OXYCODONE HCL 5 MG PO TABS
ORAL_TABLET | ORAL | Status: AC
  Filled 2015-01-15: qty 2

## 2015-01-15 MED ORDER — SUCCINYLCHOLINE CHLORIDE 20 MG/ML IJ SOLN
INTRAMUSCULAR | Status: AC
  Filled 2015-01-15: qty 1

## 2015-01-15 MED ORDER — MIDAZOLAM HCL 2 MG/2ML IJ SOLN
INTRAMUSCULAR | Status: AC
  Filled 2015-01-15: qty 4

## 2015-01-15 MED ORDER — BUPIVACAINE-EPINEPHRINE (PF) 0.25% -1:200000 IJ SOLN
INTRAMUSCULAR | Status: AC
  Filled 2015-01-15: qty 30

## 2015-01-15 MED ORDER — LACTATED RINGERS IV SOLN: INTRAVENOUS | Status: DC

## 2015-01-15 MED ORDER — LIDOCAINE HCL (CARDIAC) 20 MG/ML IV SOLN
INTRAVENOUS | Status: AC
  Filled 2015-01-15: qty 5

## 2015-01-15 MED ORDER — BUPIVACAINE-EPINEPHRINE 0.25% -1:200000 IJ SOLN
INTRAMUSCULAR | Status: DC | PRN
  Administered 2015-01-15: 10 mL

## 2015-01-15 MED ORDER — FLEET ENEMA 7-19 GM/118ML RE ENEM
1.0000 | ENEMA | Freq: Once | RECTAL | Status: AC
  Administered 2015-01-16: 1 via RECTAL
  Filled 2015-01-15: qty 1

## 2015-01-15 MED ORDER — HEMOSTATIC AGENTS (NO CHARGE) OPTIME
TOPICAL | Status: DC | PRN
  Administered 2015-01-15: 1 via TOPICAL

## 2015-01-15 MED ORDER — ONDANSETRON HCL 4 MG/2ML IJ SOLN
INTRAMUSCULAR | Status: AC
  Filled 2015-01-15: qty 2

## 2015-01-15 MED ORDER — DEXTROSE 5 % IV SOLN
500.0000 mg | INTRAVENOUS | Status: AC
  Administered 2015-01-15: 500 mg via INTRAVENOUS
  Filled 2015-01-15: qty 5

## 2015-01-15 MED ORDER — LIDOCAINE HCL (CARDIAC) 20 MG/ML IV SOLN
INTRAVENOUS | Status: DC | PRN
  Administered 2015-01-15: 50 mg via INTRAVENOUS

## 2015-01-15 MED ORDER — METHOCARBAMOL 500 MG PO TABS: 500.0000 mg | ORAL_TABLET | Freq: Three times a day (TID) | ORAL | Status: DC | PRN

## 2015-01-15 MED ORDER — HYDROMORPHONE HCL 1 MG/ML IJ SOLN
0.2500 mg | INTRAMUSCULAR | Status: DC | PRN
  Administered 2015-01-15 (×4): 0.5 mg via INTRAVENOUS

## 2015-01-15 MED ORDER — METHOCARBAMOL 500 MG PO TABS
500.0000 mg | ORAL_TABLET | Freq: Four times a day (QID) | ORAL | Status: DC | PRN
  Administered 2015-01-15 – 2015-01-16 (×2): 500 mg via ORAL
  Filled 2015-01-15 (×2): qty 1

## 2015-01-15 MED ORDER — SODIUM CHLORIDE 0.9 % IV SOLN
500.0000 mg | INTRAVENOUS | Status: DC | PRN
  Administered 2015-01-15: 10 ug/kg/min via INTRAVENOUS

## 2015-01-15 MED ORDER — ONDANSETRON HCL 4 MG PO TABS: 4.0000 mg | ORAL_TABLET | Freq: Three times a day (TID) | ORAL | Status: DC | PRN

## 2015-01-15 SURGICAL SUPPLY — 76 items
BLADE SURG ROTATE 9660 (MISCELLANEOUS) IMPLANT
BUR EGG ELITE 4.0 (BURR) IMPLANT
BUR EGG ELITE 4.0MM (BURR)
CAGE TLIF XL 10 SPINE (Cage) ×1 IMPLANT
CAGE TLIF XL 10MM SPINE (Cage) ×1 IMPLANT
CLIP NEUROVISION LG (CLIP) ×2 IMPLANT
CLOSURE STERI-STRIP 1/2X4 (GAUZE/BANDAGES/DRESSINGS) ×1
CLSR STERI-STRIP ANTIMIC 1/2X4 (GAUZE/BANDAGES/DRESSINGS) ×2 IMPLANT
CONT SPEC 4OZ CLIKSEAL STRL BL (MISCELLANEOUS) ×2 IMPLANT
COVER SURGICAL LIGHT HANDLE (MISCELLANEOUS) ×3 IMPLANT
DRAPE C-ARM 42X72 X-RAY (DRAPES) ×3 IMPLANT
DRAPE C-ARMOR (DRAPES) ×3 IMPLANT
DRAPE POUCH INSTRU U-SHP 10X18 (DRAPES) ×3 IMPLANT
DRAPE SURG 17X23 STRL (DRAPES) ×3 IMPLANT
DRAPE U-SHAPE 47X51 STRL (DRAPES) ×3 IMPLANT
DRSG MEPILEX BORDER 4X8 (GAUZE/BANDAGES/DRESSINGS) ×3 IMPLANT
DURAPREP 26ML APPLICATOR (WOUND CARE) ×3 IMPLANT
ELECT BLADE 4.0 EZ CLEAN MEGAD (MISCELLANEOUS) ×3
ELECT BLADE 6.5 EXT (BLADE) IMPLANT
ELECT PENCIL ROCKER SW 15FT (MISCELLANEOUS) ×3 IMPLANT
ELECT REM PT RETURN 9FT ADLT (ELECTROSURGICAL) ×3
ELECTRODE BLDE 4.0 EZ CLN MEGD (MISCELLANEOUS) ×1 IMPLANT
ELECTRODE REM PT RTRN 9FT ADLT (ELECTROSURGICAL) ×1 IMPLANT
GLOVE BIOGEL PI IND STRL 8.5 (GLOVE) ×1 IMPLANT
GLOVE BIOGEL PI INDICATOR 8.5 (GLOVE) ×2
GLOVE SS BIOGEL STRL SZ 8.5 (GLOVE) ×1 IMPLANT
GLOVE SUPERSENSE BIOGEL SZ 8.5 (GLOVE) ×2
GOWN STRL REUS W/ TWL LRG LVL3 (GOWN DISPOSABLE) ×1 IMPLANT
GOWN STRL REUS W/TWL 2XL LVL3 (GOWN DISPOSABLE) ×6 IMPLANT
GOWN STRL REUS W/TWL LRG LVL3 (GOWN DISPOSABLE) ×3
GUIDEWIRE NITINOL BEVEL TIP (WIRE) ×8 IMPLANT
KIT BASIN OR (CUSTOM PROCEDURE TRAY) ×3 IMPLANT
KIT NDL NVM5 EMG ELECT (KITS) IMPLANT
KIT NEEDLE NVM5 EMG ELECT (KITS) ×1 IMPLANT
KIT NEEDLE NVM5 EMG ELECTRODE (KITS) ×2
KIT POSITION SURG JACKSON T1 (MISCELLANEOUS) IMPLANT
KIT ROOM TURNOVER OR (KITS) ×3 IMPLANT
LIGHT SOURCE ANGLE TIP STR 7FT (MISCELLANEOUS) ×2 IMPLANT
NDL I-PASS III (NEEDLE) IMPLANT
NDL SPNL 18GX3.5 QUINCKE PK (NEEDLE) ×2 IMPLANT
NEEDLE 22X1 1/2 (OR ONLY) (NEEDLE) ×3 IMPLANT
NEEDLE I-PASS III (NEEDLE) ×3 IMPLANT
NEEDLE SPNL 18GX3.5 QUINCKE PK (NEEDLE) ×6 IMPLANT
NS IRRIG 1000ML POUR BTL (IV SOLUTION) ×3 IMPLANT
PACK LAMINECTOMY ORTHO (CUSTOM PROCEDURE TRAY) ×3 IMPLANT
PACK UNIVERSAL I (CUSTOM PROCEDURE TRAY) ×3 IMPLANT
PAD ARMBOARD 7.5X6 YLW CONV (MISCELLANEOUS) ×8 IMPLANT
PATTIES SURGICAL .5 X.5 (GAUZE/BANDAGES/DRESSINGS) IMPLANT
PATTIES SURGICAL .5 X1 (DISPOSABLE) ×3 IMPLANT
POSITIONER HEAD PRONE TRACH (MISCELLANEOUS) ×3 IMPLANT
PROBE BALL TIP NVM5 SNG USE (BALLOONS) ×2 IMPLANT
PUTTY DBX 1CC (Putty) ×3 IMPLANT
PUTTY DBX 1CC DEPUY (Putty) IMPLANT
ROD RELINE MAS LORD 5.5X50 (Rod) ×4 IMPLANT
SCREW LOCK RELINE 5.5 TULIP (Screw) ×8 IMPLANT
SCREW RELINE MAS POLY 6.5X40MM (Screw) ×2 IMPLANT
SCREW RELINE RED 6.5X45MM POLY (Screw) ×2 IMPLANT
SCREW SHANK RELINE 6.5X45MM 2C (Screw) ×4 IMPLANT
SHEET CONFORM 45LX20WX5H (Bone Implant) ×2 IMPLANT
SPINE TULIP RELINE MOD (Neuro Prosthesis/Implant) ×6 IMPLANT
SPONGE LAP 4X18 X RAY DECT (DISPOSABLE) ×4 IMPLANT
SPONGE SURGIFOAM ABS GEL 100 (HEMOSTASIS) ×3 IMPLANT
SURGIFLO W/THROMBIN 8M KIT (HEMOSTASIS) ×2 IMPLANT
SUT BONE WAX W31G (SUTURE) ×3 IMPLANT
SUT MNCRL AB 3-0 PS2 18 (SUTURE) ×6 IMPLANT
SUT VIC AB 1 CT1 18XCR BRD 8 (SUTURE) ×1 IMPLANT
SUT VIC AB 1 CT1 8-18 (SUTURE) ×6
SUT VIC AB 2-0 CT1 18 (SUTURE) ×5 IMPLANT
SYR BULB IRRIGATION 50ML (SYRINGE) ×3 IMPLANT
SYR CONTROL 10ML LL (SYRINGE) ×3 IMPLANT
TOWEL OR 17X24 6PK STRL BLUE (TOWEL DISPOSABLE) ×3 IMPLANT
TOWEL OR 17X26 10 PK STRL BLUE (TOWEL DISPOSABLE) ×3 IMPLANT
TRAY FOLEY CATH 16FRSI W/METER (SET/KITS/TRAYS/PACK) ×3 IMPLANT
TULIP SPINE RELINE MOD (Neuro Prosthesis/Implant) IMPLANT
WATER STERILE IRR 1000ML POUR (IV SOLUTION) ×1 IMPLANT
YANKAUER SUCT BULB TIP NO VENT (SUCTIONS) ×3 IMPLANT

## 2015-01-15 NOTE — Evaluation (Signed)
Physical Therapy Evaluation Patient Details Name: Tara Cruz MRN: [id number] DOB: 08/11/1950 Today's Date: 01/15/2015   History of Present Illness  64 y.o. s/p L3-4 GIL DECOMPRESSION AND POSTERIOR SPINAL FUSION ANTIBODY L3-4  Clinical Impression  Patient is s/p above surgery presenting with functional limitations due to the deficits listed below (see PT Problem List). Moderately guarded while ambulating, using a rolling walker for support. Reviewed safe mobility/transfer techniques. No buckling noted. States LE symptoms pre-op are now resolved, post-op. Will need practice stepping up onto a step stool to enter/exit truck due to higher lift, her husband can bring one for d/c. Patient will benefit from skilled PT to increase their independence and safety with mobility to allow discharge to the venue listed below.       Follow Up Recommendations No PT follow up;Supervision for mobility/OOB    Equipment Recommendations  Rolling walker with 5" wheels    Recommendations for Other Services       Precautions / Restrictions Precautions Precautions: Fall;Back Precaution Booklet Issued: Yes (comment) Precaution Comments: educated on back precautions Required Braces or Orthoses: Spinal Brace Spinal Brace: Lumbar corset;Applied in sitting position Restrictions Weight Bearing Restrictions: No      Mobility  Bed Mobility Overal bed mobility: Needs Assistance Bed Mobility: Rolling;Sidelying to Sit;Sit to Sidelying Rolling: Min guard Sidelying to sit: Min guard     Sit to sidelying: Min guard General bed mobility comments: Reviewed log roll technique. VC to maintain back precautions. Requires a lot of effort and some extra time but able to complete without physical assist.  Transfers Overall transfer level: Needs assistance Equipment used: Rolling walker (2 wheeled) Transfers: Sit to/from Stand Sit to Stand: Min guard         General transfer comment: Min guard for safety.  VC for hand placement. Good control with descent. Slow to rise from lowest bed setting.  Ambulation/Gait Ambulation/Gait assistance: Supervision Ambulation Distance (Feet): 115 Feet Assistive device: Rolling walker (2 wheeled) Gait Pattern/deviations: Step-through pattern;Decreased stride length;Trunk flexed Gait velocity: slow Gait velocity interpretation: Below normal speed for age/gender General Gait Details: VC for upright posture, with education for proper use of RW for support. Relies moderately on Rw for stability. No buckling noted. VC for awareness of precautions while ambulating.  Stairs            Wheelchair Mobility    Modified Rankin (Stroke Patients Only)       Balance Overall balance assessment: Needs assistance Sitting-balance support: No upper extremity supported;Feet supported Sitting balance-Leahy Scale: Fair     Standing balance support: No upper extremity supported Standing balance-Leahy Scale: Fair                               Pertinent Vitals/Pain Pain Assessment: 0-10 Pain Score:  ("It's getting bad" no value given) Pain Location: back Pain Descriptors / Indicators: Aching;Burning Pain Intervention(s): Limited activity within patient's tolerance;Monitored during session;Repositioned;Patient requesting pain meds-RN notified    Home Living Family/patient expects to be discharged to:: Private residence Living Arrangements: Spouse/significant other Available Help at Discharge: Family;Available 24 hours/day ([place] [facility] [place]) Type of Home: House Home Access: Level entry (There will be ~4 steps at [place]. [place] [facility])     Home Layout: Two level;Able to live on main level with bedroom/bathroom Home Equipment: Bedside commode;Other (comment);Walker - standard (grab bar that can be placed in shower) Additional Comments: spouse uses  walker some    Prior Function Level of Independence: Independent                Hand Dominance   Dominant Hand: Right    Extremity/Trunk Assessment   Upper Extremity Assessment: Defer to OT evaluation           Lower Extremity Assessment: Generalized weakness (reports LE pain has resolved post-op)         Communication   Communication: No difficulties  Cognition Arousal/Alertness: Awake/alert Behavior During Therapy: WFL for tasks assessed/performed Overall Cognitive Status: Within Functional Limits for tasks assessed       Memory: Decreased recall of precautions              General Comments General comments (skin integrity, edema, etc.): Husband present. Pt has to climb into a truck at d/c. Will need step-stool.    Exercises        Assessment/Plan    PT Assessment Patient needs continued PT services  PT Diagnosis Difficulty walking;Abnormality of gait;Generalized weakness;Acute pain   PT Problem List Decreased strength;Decreased range of motion;Decreased activity tolerance;Decreased balance;Decreased mobility;Decreased knowledge of use of DME;Decreased knowledge of precautions;Pain  PT Treatment Interventions DME instruction;Gait training;Stair training;Functional mobility training;Therapeutic activities;Therapeutic exercise;Balance training;Neuromuscular re-education;Patient/family education;Modalities   PT Goals (Current goals can be found in the Care Plan section) Acute Rehab PT Goals Patient Stated Goal: Shag PT Goal Formulation: With patient Time For Goal Achievement: 01/29/15 Potential to Achieve Goals: Good    Frequency Min 5X/week   Barriers to discharge        Co-evaluation               End of Session Equipment Utilized During Treatment: Gait belt;Back brace Activity Tolerance: Patient tolerated treatment well Patient left: in bed;with call bell/phone within reach;with family/visitor present;with SCD's reapplied Nurse Communication: Mobility status;Patient requests pain meds          Time: 1731-1756 PT Time Calculation (min) (ACUTE ONLY): 25 min   Charges:   PT Evaluation $Initial PT Evaluation Tier I: 1 Procedure PT Treatments $Gait Training: 8-22 mins   PT G Codes:        Tara Cruz 01/15/2015, 6:28 PM Tara Cruz, Tara Cruz [phone]

## 2015-01-15 NOTE — Transfer of Care (Signed)
Immediate Anesthesia Transfer of Care Note  Patient: Tara Cruz  Procedure(s) Performed: Procedure(s): L3-4 GIL DECOMPRESSION AND POSTERIOR SPINAL FUSION ANTIBODY L3-4 (N/A)  Patient Location: PACU  Anesthesia Type:General  Level of Consciousness: awake, alert , oriented, patient cooperative and responds to stimulation  Airway & Oxygen Therapy: Patient Spontanous Breathing and Patient connected to nasal cannula oxygen  Post-op Assessment: Report given to RN, Post -op Vital signs reviewed and stable, Patient moving all extremities X 4 and Patient able to stick tongue midline  Post vital signs: stable  Last Vitals:  Filed Vitals:   01/15/15 0718  BP: 171/82  Pulse: 65  Temp: 36.6 C  Resp: 20    Complications: No apparent anesthesia complications

## 2015-01-15 NOTE — H&P (Signed)
EPIC not on line this AM Printed hard copy of complete HPI in chart

## 2015-01-15 NOTE — Anesthesia Preprocedure Evaluation (Addendum)
Anesthesia Evaluation  Patient identified by MRN, date of birth, ID band Patient awake    Reviewed: Allergy & Precautions, NPO status , Patient's Chart, lab work & pertinent test results  Airway Mallampati: I  TM Distance: >3 FB Neck ROM: Full    Dental  (+) Teeth Intact   Pulmonary shortness of breath, asthma , COPDCurrent Smoker,  breath sounds clear to auscultation        Cardiovascular hypertension, + CAD + dysrhythmias Rhythm:Regular Rate:Normal     Neuro/Psych    GI/Hepatic negative GI ROS, Neg liver ROS,   Endo/Other  negative endocrine ROS  Renal/GU negative Renal ROS     Musculoskeletal  (+) Arthritis -,   Abdominal   Peds  Hematology negative hematology ROS (+)   Anesthesia Other Findings   Reproductive/Obstetrics                            Anesthesia Physical Anesthesia Plan  ASA: III  Anesthesia Plan: General   Post-op Pain Management:    Induction: Intravenous  Airway Management Planned: Oral ETT  Additional Equipment:   Intra-op Plan:   Post-operative Plan: Extubation in OR  Informed Consent: I have reviewed the patients History and Physical, chart, labs and discussed the procedure including the risks, benefits and alternatives for the proposed anesthesia with the patient or authorized representative who has indicated his/her understanding and acceptance.   Dental advisory given  Plan Discussed with: CRNA and Surgeon  Anesthesia Plan Comments:         Anesthesia Quick Evaluation

## 2015-01-15 NOTE — Anesthesia Postprocedure Evaluation (Signed)
  Anesthesia Post-op Note  Patient: Tara Cruz  Procedure(s) Performed: Procedure(s): L3-4 GIL DECOMPRESSION AND POSTERIOR SPINAL FUSION ANTIBODY L3-4 (N/A)  Patient Location: PACU  Anesthesia Type:General  Level of Consciousness: awake and alert   Airway and Oxygen Therapy: Patient Spontanous Breathing  Post-op Pain: Controlled  Post-op Assessment: Post-op Vital signs reviewed, Patient's Cardiovascular Status Stable and Respiratory Function Stable  Post-op Vital Signs: Reviewed  Filed Vitals:   01/15/15 1315  BP:   Pulse: 67  Temp:   Resp: 12    Complications: No apparent anesthesia complications

## 2015-01-15 NOTE — Progress Notes (Signed)
Utilization review completed.  

## 2015-01-15 NOTE — Brief Op Note (Signed)
01/15/2015  12:00 PM  PATIENT:  Ledora Bottcher  64 y.o. female  PRE-OPERATIVE DIAGNOSIS:  L3-4 FACET CYST WITH GRADE 1 SLIP  POST-OPERATIVE DIAGNOSIS:  L3-4 FACET CYST WITH GRADE 1 SLIP  PROCEDURE:  Procedure(s): L3-4 GIL DECOMPRESSION AND POSTERIOR SPINAL FUSION ANTIBODY L3-4 (N/A)  SURGEON:  Surgeon(s) and Role:    * Venita Lick, MD - Primary  PHYSICIAN ASSISTANT:   ASSISTANTS: carmen mayo   ANESTHESIA:   general  EBL:  Total I/O In: 2000 [I.V.:2000] Out: 875 [Urine:775; Blood:100]  BLOOD ADMINISTERED:none  DRAINS: none   LOCAL MEDICATIONS USED:  MARCAINE     SPECIMEN:  Source of Specimen:  synovial cyst right L3/4 facet  DISPOSITION OF SPECIMEN:  PATHOLOGY  COUNTS:  YES  TOURNIQUET:  * No tourniquets in log *  DICTATION: .Other Dictation: Dictation Number [id number]  PLAN OF CARE: Admit to inpatient   PATIENT DISPOSITION:  PACU - hemodynamically stable.

## 2015-01-15 NOTE — Evaluation (Signed)
Occupational Therapy Evaluation Patient Details Name: Tara LOY MRN: [id number] DOB: 30-Mar-1951 Today's Date: 01/15/2015    History of Present Illness 64 y.o. s/p L3-4 GIL DECOMPRESSION AND POSTERIOR SPINAL FUSION ANTIBODY L3-4   Clinical Impression   Pt s/p above. Pt independent with ADLs, PTA. Feel pt will benefit from acute OT to increase independence and reinforce precautions prior to d/c. Do not feel pt will need follow up OT upon d/c.    Follow Up Recommendations  No OT follow up;Supervision - Intermittent    Equipment Recommendations  Other (comment) (TBD)    Recommendations for Other Services       Precautions / Restrictions Precautions Precautions: Fall;Back Precaution Booklet Issued: Yes (comment) Precaution Comments: educated on back precautions Required Braces or Orthoses: Spinal Brace Spinal Brace: Lumbar corset;Applied in sitting position Restrictions Weight Bearing Restrictions: No      Mobility Bed Mobility Overal bed mobility: Needs Assistance Bed Mobility: Rolling;Sidelying to Sit;Sit to Sidelying Rolling: Min assist;Supervision Sidelying to sit: Supervision     Sit to sidelying: Min assist General bed mobility comments: cues for technique.  Transfers Overall transfer level: Needs assistance   Transfers: Sit to/from Stand Sit to Stand: Min guard              Balance  Handheld assist at times in session. Pt moves very slowly.                                          ADL Overall ADL's : Needs assistance/impaired                 Upper Body Dressing : Set up;Supervision/safety;Sitting   Lower Body Dressing: Minimal assistance;Sit to/from stand   Toilet Transfer: Minimal assistance;Min guard;Ambulation (hand held assist and without )           Functional mobility during ADLs: Min guard;Minimal assistance (hand held assist and without) General ADL Comments: Discussed incorporating precautions into  functional activities. Educated on back brace (clothing underneath and no sleeping in it). Educated on safety such as safe footwear, rugs/items on floor, and sitting for LB bathing. Educated on positioning of pillows and recommended only sitting in chair for 45 minutes at a time (pt did not get in chair today). Explained a RW may be helpful for her. Discussed AE including what she could use for toilet aide if needed. Pt reports she doesn't wear socks and may not wear underwear.     Vision     Perception     Praxis      Pertinent Vitals/Pain Pain Assessment: 0-10 Pain Score:  (2-3) Pain Location: back Pain Descriptors / Indicators: Aching Pain Intervention(s): Repositioned;Monitored during session     Hand Dominance Right   Extremity/Trunk Assessment Upper Extremity Assessment Upper Extremity Assessment: Overall WFL for tasks assessed (pain with shoulder flexion)   Lower Extremity Assessment Lower Extremity Assessment: Defer to PT evaluation       Communication Communication Communication: No difficulties   Cognition Arousal/Alertness: Awake/alert Behavior During Therapy: WFL for tasks assessed/performed Overall Cognitive Status: Within Functional Limits for tasks assessed       Memory: Decreased recall of precautions             General Comments       Exercises       Shoulder Instructions      Home Living Family/patient expects to be discharged  to:: Private residence Living Arrangements: Spouse/significant other Available Help at Discharge: Family (can work out someone else to assist if needed) Type of Home: House Home Access: Level entry     Home Layout: Two level;Able to live on main level with bedroom/bathroom     Bathroom Shower/Tub: Chief Strategy Officer: Standard     Home Equipment: Bedside commode;Other (comment);Walker - standard (grab bar that can be placed in shower)   Additional Comments: spouse uses walker some       Prior Functioning/Environment Level of Independence: Independent             OT Diagnosis: Acute pain   OT Problem List: Pain;Decreased knowledge of precautions;Decreased knowledge of use of DME or AE;Impaired balance (sitting and/or standing);Decreased range of motion;Decreased activity tolerance   OT Treatment/Interventions: Self-care/ADL training;DME and/or AE instruction;Therapeutic activities;Patient/family education;Balance training    OT Goals(Current goals can be found in the care plan section) Acute Rehab OT Goals Patient Stated Goal: not stated OT Goal Formulation: With patient Time For Goal Achievement: 01/22/15 Potential to Achieve Goals: Good ADL Goals Pt Will Perform Grooming: with set-up;standing Pt Will Perform Lower Body Dressing: with set-up;sit to/from stand;with supervision Pt Will Transfer to Toilet: with supervision;ambulating (3 in 1 over commode) Pt Will Perform Toileting - Clothing Manipulation and hygiene: sit to/from stand;with supervision Pt Will Perform Tub/Shower Transfer: Tub transfer;with supervision;ambulating;3 in 1;rolling walker Additional ADL Goal #1: Pt will independently verbalize 3/3 back precautions and maintain during session.  OT Frequency: Min 2X/week   Barriers to D/C:            Co-evaluation              End of Session Equipment Utilized During Treatment: Gait belt;Back brace Nurse Communication: Mobility status  Activity Tolerance: Other (comment) (became lightheaded ) Patient left: in bed;with call bell/phone within reach;with family/visitor present   Time: 1610-9604 OT Time Calculation (min): 35 min Charges:  OT General Charges $OT Visit: 1 Procedure OT Evaluation $Initial OT Evaluation Tier I: 1 Procedure OT Treatments $Self Care/Home Management : 8-22 mins G-CodesEarlie Raveling OTR/L [phone] 01/15/2015, 5:44 PM

## 2015-01-15 NOTE — Anesthesia Procedure Notes (Signed)
Procedure Name: Intubation Date/Time: 01/15/2015 8:50 AM Performed by: Marylyn Ishihara Pre-anesthesia Checklist: Patient identified, Timeout performed, Emergency Drugs available, Suction available and Patient being monitored Patient Re-evaluated:Patient Re-evaluated prior to inductionOxygen Delivery Method: Circle system utilized Preoxygenation: Pre-oxygenation with 100% oxygen Intubation Type: IV induction Ventilation: Mask ventilation without difficulty Laryngoscope Size: Mac and 3 Grade View: Grade I Tube size: 7.5 mm Number of attempts: 1 Airway Equipment and Method: Stylet Placement Confirmation: ETT inserted through vocal cords under direct vision,  positive ETCO2 and breath sounds checked- equal and bilateral Secured at: 21 cm Tube secured with: Tape Dental Injury: Teeth and Oropharynx as per pre-operative assessment

## 2015-01-16 ENCOUNTER — Inpatient Hospital Stay (HOSPITAL_COMMUNITY): Payer: Medicare Other

## 2015-01-16 ENCOUNTER — Encounter (HOSPITAL_COMMUNITY): Payer: Self-pay | Admitting: Orthopedic Surgery

## 2015-01-16 NOTE — Op Note (Signed)
NAMERHODIE, CIENFUEGOS            ACCOUNT NO.:  [id number]  MEDICAL RECORD NO.:  [id number]  LOCATION:  3C07C                        FACILITY:  [facility]  PHYSICIAN:  Rashad Obeid D. Shon Baton, M.D. 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  OPERATIVE PROCEDURE:  Transforaminal lumbar interbody fusion, L3-4.  COMPLICATIONS:  None.  CONDITION:  Stable.  INSTRUMENTATION SYSTEM USED:  NuVasive MIS pedicle screw system with 6.5 diameter screws, 45 mm length except for the left L3 screw which was 40 mm in length and secured with two 50-mm titanium rods with a size 10 extra long Titan titanium cage packed with DBX and local bone autograft.  FIRST ASSISTANT:  Murphy, [place]  COMPLICATIONS:  None.  CONDITION:  Stable.  HISTORY:  This is a very pleasant 64 year old woman who has been having severe neuropathic right leg pain.  Attempts at conservative management had failed to alleviate symptoms.  As a result, we elected to proceed with surgery.  All appropriate risks, benefits, and alternatives were discussed with the patient and consent was obtained.  OPERATIVE NOTE:  The patient was brought to the operating room, placed supine on the operating table.  After successful induction of general anesthesia and endotracheal intubation, TEDs, SCDs, and a Foley were inserted.  Needles for intraoperative neuro monitoring were then placed, and the patient was turned prone onto the Wilson frame.  All bony prominences were well padded.  The back was prepped and draped in a standard fashion.  Time-out was taken to confirm patient, procedure, and all other pertinent important data.  Once this was completed, I then identified the lateral  border of the L3-L4 pedicles bilaterally.  I infiltrated the area with 0.25% Marcaine with epinephrine.  On the left- hand side, small stab incisions were made and a Jamshidi needle was advanced percutaneously to the lateral border of the pedicle.  I then connected the Jamshidi needle to the neuro monitoring device and using real time fluoro, I advanced the Jamshidi needle while stimulating it down to the medial border of the pedicle.  I then went into the lateral planes confirming that I was beyond the posterior margin of the vertebral body and advanced into the vertebral body.  I then aspirated 10 mL of blood to mix with the Conform allograft sheet.  I then placed the guidepin and secured it.  I repeated this procedure at L4 and on the contralateral side of L3 and L4.  Once all 4 pedicles were cannulated, I tapped on the left-hand side and then placed appropriate size pedicle screws.  I stimulated the screws and confirmed that I had no abnormal signal changes and radiographically they were in proper position.  I then went to the right-hand side and at this time I made an incision 2 fingerbreadths from the midline.  I performed a standard Smith-Robinson approach to the spine.  Once I identified the appropriate starting place, using the same technique I used on the  contralateral side I placed my pedicle screws on this side.  The screws themselves were connected to the retracting blade and the self-retaining retractor was set up.  I then used a Cobb elevator to remove the remaining paraspinal muscles to expose the lamina of L3.  The medial retracting blade was then placed.  I irrigated the wound copiously with normal saline and then began my decompression.  Using an osteotome, I removed the bulk of the inferior L3 facet.  I completed the facetectomy using a 3 and 4 mm Kerrison punch.  Once the facet was removed, I then performed a generous laminotomy at L3.  At this point, with the  ligamentum still intact, I could palpate the significant synovial cyst that remained.  I gently using a Penfield 4 dissected through the ligamentum flavum and released it from the superior portion near the L3 remaining at the L3 lamina.  This allowed me to reflect it back and identify the thecal sac.  I then continued to create a plane between the synovial cyst and the thecal sac and began decompressing and excising the cyst en bloc.  Once I had completely freed it from thecal sac, I released it from the inferior portion and sent it to pathology for complete analysis.  At this point, the cyst was completely removed.  I then identified the traversing L4 nerve root and began decompressing this.  I identified the medial border of the L4 pedicle and performed a foraminotomy.  At this point, with the pars of L3 completely removed, I had complete decompression of the L3 exiting nerve root.  I could now take a [facility], palpate the inferior medial border of the L3 pedicle, and the medial and superior border of the L4 pedicle.  There was no evidence of any breach.  The thecal sac was gently retracted.  I coagulated the epidural veins and then incised the annulus with a 15 blade scalpel.  Using a combination of pituitary rongeurs, curettes, and Kerrison rongeurs, I removed all the disk material from the 3-4 disk space.  I could visualize and palpate the bleeding subchondral bone.  At this point, I then measured and elected to use a size 10 Titan titanium extra long cage.  This cage was packed with local bone and DBX.  I irrigated the wound copiously with normal saline after I completed my diskectomy. I then placed a Conform sheet along the anterior annulus.  I then malleted the cage to the appropriate depth.  With that in the vertical position, I then redirected so it was repositioned in a horizontal plane in the anterior third of the disk space.  I had good apposition with the endplates.   At this point, I then removed the kyphosis that I had built into the bed using the Wilson frame and then applied the polyaxial heads to the screws.  I then obtained the appropriate size rod and secured it into position.  The end caps were torqued off according to manufacture's standards.  On the contralateral side, I percutaneously placed a rod same length of 50 mm and locked it into position according to manufacture's standards.  I then took final x-rays which were satisfactory.  At this point, all wounds were copiously irrigated with normal saline. Using bipolar electrocautery and FloSeal, I obtained hemostasis. Thrombin-soaked Gelfoam patty was then placed over the exposed thecal sac on the right side.  I then closed the deep fascia in layered fashion with interrupted #1 Vicryl sutures, 2-0 Vicryl  sutures and a 3-0 Monocryl.  Similarly, the contralateral side was also closed.  Dry dressings were applied, and the patient was ultimately extubated, transferred to PACU without incident.  At the end of the case, all needle and sponge counts were correct.  There were no adverse intraoperative events.     Cleva Camero D. Shon Baton, M.D.    DDB/MEDQ  D:  01/15/2015  T:  01/16/2015  Job:  [id number]  cc:   Dr. Shon Baton' office

## 2015-01-16 NOTE — Progress Notes (Signed)
    Subjective: Procedure(s) (LRB): L3-4 GIL DECOMPRESSION AND POSTERIOR SPINAL FUSION ANTIBODY L3-4 (N/A) 1 Day Post-Op  Patient reports pain as 1 on 0-10 scale.  Reports none leg pain reports incisional back pain   Foley just removed  - patient has not yet voided Negative bowel movement Negative flatus Negative chest pain or shortness of breath  Objective: Vital signs in last 24 hours: Temp:  [97.7 F (36.5 C)-98.4 F (36.9 C)] 98.4 F (36.9 C) (08/18 0400) Pulse Rate:  [57-80] 71 (08/18 0400) Resp:  [6-19] 18 (08/18 0400) BP: (121-167)/(62-84) 165/73 mmHg (08/18 0400) SpO2:  [96 %-100 %] 96 % (08/18 0400)  Intake/Output from previous day: 08/17 0701 - 08/18 0700 In: 3630 [P.O.:600; I.V.:2975; IV Piggyback:55] Out: 3525 [Urine:3375; Blood:150]  Labs: No results for input(s): WBC, RBC, HCT, PLT in the last 72 hours. No results for input(s): NA, K, CL, CO2, BUN, CREATININE, GLUCOSE, CALCIUM in the last 72 hours. No results for input(s): LABPT, INR in the last 72 hours.  Physical Exam: Neurologically intact ABD soft Intact pulses distally Incision: dressing C/D/I Compartment soft  Assessment/Plan: Patient stable  xrays satisfactory Continue mobilization with physical therapy Continue care  Advance diet Up with therapy  Plan on d/c today after BM/urination Doing exceptional well - radicular leg pain completely resolved  Venita Lick, MD [facility] [phone]

## 2015-01-16 NOTE — Progress Notes (Signed)
Physical Therapy Treatment Patient Details Name: Tara Cruz MRN: [id number] DOB: [birth year] Today's Date: 01/16/2015    History of Present Illness 64 y.o. s/p L3-4 GIL DECOMPRESSION AND POSTERIOR SPINAL FUSION ANTIBODY L3-4    PT Comments    Patient mobilizing well, some VCs for compliance with precautions for safety. Educated patient on technique for car transfer with simulation of backwards step up into truck. Performed stair negotiation without physical assist and ambulating well. At this time feel patient will be safe for d/c home with supervision.   Follow Up Recommendations  No PT follow up;Supervision for mobility/OOB     Equipment Recommendations  Rolling walker with 5" wheels    Recommendations for Other Services       Precautions / Restrictions Precautions Precautions: Fall;Back Precaution Comments: educated on back precautions Required Braces or Orthoses: Spinal Brace Spinal Brace: Lumbar corset;Applied in sitting position Restrictions Weight Bearing Restrictions: No    Mobility  Bed Mobility Overal bed mobility: Needs Assistance     Sidelying to sit: Supervision (sitting at EOB)       General bed mobility comments: Verbally reviewed technique for coming to EOB  Transfers Overall transfer level: Needs assistance Equipment used: Rolling walker (2 wheeled) Transfers: Sit to/from Stand Sit to Stand: Supervision         General transfer comment: VCs for hand placement and positioning  Ambulation/Gait Ambulation/Gait assistance: Modified independent (Device/Increase time) Ambulation Distance (Feet): 440 Feet Assistive device: Rolling walker (2 wheeled) Gait Pattern/deviations: Step-through pattern;Decreased stride length;Trunk flexed Gait velocity: improved Gait velocity interpretation: Below normal speed for age/gender General Gait Details: improvements noted in fluency of gait, and compliance with turns and use of RW   Stairs Stairs:  Yes Stairs assistance: Supervision Stair Management: One rail Right;Alternating pattern Number of Stairs: 6 General stair comments: no physical assist required. Additionaly time spent helping patient peform backwards step up to simulate stepping into truck with bilateral UE support  Wheelchair Mobility    Modified Rankin (Stroke Patients Only)       Balance   Sitting-balance support: No upper extremity supported Sitting balance-Leahy Scale: Fair     Standing balance support: No upper extremity supported Standing balance-Leahy Scale: Fair                      Cognition Arousal/Alertness: Awake/alert Behavior During Therapy: WFL for tasks assessed/performed Overall Cognitive Status: Within Functional Limits for tasks assessed       Memory: Decreased recall of precautions              Exercises      General Comments General comments (skin integrity, edema, etc.): Reviewed technique for stepping onto step stool backwards with bilateral UE hand held support to get into truck      Pertinent Vitals/Pain Pain Assessment: 0-10 Pain Score:  (no pain at rest, 5/10 when trying to transition) Pain Location: back Pain Descriptors / Indicators: Sore Pain Intervention(s): Repositioned;Monitored during session    Home Living                      Prior Function            PT Goals (current goals can now be found in the care plan section) Acute Rehab PT Goals Patient Stated Goal: Shag PT Goal Formulation: With patient Time For Goal Achievement: 01/29/15 Potential to Achieve Goals: Good Progress towards PT goals: Progressing toward goals (safe for d/c home with supervision)  Frequency  Min 5X/week    PT Plan Current plan remains appropriate    Co-evaluation             End of Session Equipment Utilized During Treatment: Gait belt;Back brace Activity Tolerance: Patient tolerated treatment well Patient left: in bed (sitting EOB with OT  present )     Time: 1610-9604 PT Time Calculation (min) (ACUTE ONLY): 16 min  Charges:  $Gait Training: 8-22 mins                    G CodesFabio Asa 2015/02/12, 9:02 AM Charlotte Crumb, PT DPT  [phone]

## 2015-01-16 NOTE — Progress Notes (Signed)
Occupational Therapy Treatment Patient Details Name: Tara Cruz MRN: [id number] DOB: December 30, 1950 Today's Date: 01/16/2015    History of present illness 64 y.o. s/p L3-4 GIL DECOMPRESSION AND POSTERIOR SPINAL FUSION ANTIBODY L3-4   OT comments  Completed education regarding ADL and adhering to back precautions with use of AE/DME and compensatory techniques. Pt verbalized understanding. Pt ready to D/C home with intermittent S when medically stable.   Follow Up Recommendations  No OT follow up;Supervision - Intermittent    Equipment Recommendations  None recommended by OT    Recommendations for Other Services      Precautions / Restrictions Precautions Precautions: Fall;Back Precaution Comments: educated on back precautions Required Braces or Orthoses: Spinal Brace Spinal Brace: Lumbar corset;Applied in sitting position Restrictions Weight Bearing Restrictions: No       Mobility Bed Mobility Overal bed mobility: Needs Assistance     Sidelying to sit: Supervision (sitting at EOB)       General bed mobility comments: Able to return demonstrte bed mobility and proper positioning in bed/chair  Transfers Overall transfer level: Needs assistance Equipment used: Rolling walker (2 wheeled) Transfers: Sit to/from Stand Sit to Stand: Supervision         General transfer comment: VCs for hand placement and positioning    Balance   Sitting-balance support: No upper extremity supported Sitting balance-Leahy Scale: Fair     Standing balance support: No upper extremity supported Standing balance-Leahy Scale: Fair                     ADL Overall ADL's : Needs assistance/impaired                                       General ADL Comments: REviewed back precautions for LB ADL using compensatory techniques and AE/ DME. Educated on hygiene after toileting while maintaining back precautions. Discussed home set up to maximize safety and  independence. Pt verbalized understanding.                                       Cognition   Behavior During Therapy: WFL for tasks assessed/performed Overall Cognitive Status: Within Functional Limits for tasks assessed       Memory: Decreased recall of precautions               Extremity/Trunk Assessment               Exercises     Shoulder Instructions       General Comments  Pt appreciative of information    Pertinent Vitals/ Pain       Pain Assessment: 0-10 Pain Score: 4  Pain Location: back Pain Descriptors / Indicators: Sore Pain Intervention(s): Limited activity within patient's tolerance;Monitored during session  Maurie Boettcher, OTR/L  [phone] 8/18/2016Home Living                                          Prior Functioning/Environment              Frequency       Progress Toward Goals  OT Goals(current goals can now be found in the care plan section)  Progress towards OT goals: Goals met/education completed, patient  discharged from OT  Acute Rehab OT Goals Patient Stated Goal: Shag OT Goal Formulation: With patient Time For Goal Achievement: 01/22/15 Potential to Achieve Goals: Good ADL Goals Pt Will Perform Grooming: with set-up;standing Pt Will Perform Lower Body Dressing: with set-up;sit to/from stand;with supervision Pt Will Transfer to Toilet: with supervision;ambulating Pt Will Perform Toileting - Clothing Manipulation and hygiene: sit to/from stand;with supervision Pt Will Perform Tub/Shower Transfer: Tub transfer;with supervision;ambulating;3 in 1;rolling walker Additional ADL Goal #1: Pt will independently verbalize 3/3 back precautions and maintain during session.  Plan Discharge plan remains appropriate    Co-evaluation                 End of Session Equipment Utilized During Treatment: Back brace   Activity Tolerance Patient tolerated treatment well   Patient Left in bed;with  call bell/phone within reach   Nurse Communication Mobility status        Time: 2903-7955 OT Time Calculation (min): 20 min  Charges: OT General Charges $OT Visit: 1 Procedure OT Treatments $Self Care/Home Management : 8-22 mins  Edrie Ehrich,HILLARY 01/16/2015, 9:41 AM

## 2015-01-16 NOTE — Progress Notes (Signed)
Pt doing well. Pt and husband given D/C instructions with Rx's, verbal understanding was provided. Pt's IV was removed prior to D/C. Pt's incision has a scant amount of old drainage. Pt D/C'd home via wheelchair @ 1155 per MD order. Pt is stable @ D/C and has no other needs at this time. Rema Fendt, RN

## 2015-01-20 ENCOUNTER — Other Ambulatory Visit: Payer: Medicare Other

## 2015-01-21 ENCOUNTER — Encounter (HOSPITAL_COMMUNITY): Payer: Self-pay | Admitting: Orthopedic Surgery

## 2015-01-23 ENCOUNTER — Encounter (HOSPITAL_COMMUNITY): Payer: Self-pay | Admitting: Orthopedic Surgery

## 2015-01-27 ENCOUNTER — Encounter: Payer: 59 | Admitting: Internal Medicine

## 2015-01-31 ENCOUNTER — Other Ambulatory Visit (INDEPENDENT_AMBULATORY_CARE_PROVIDER_SITE_OTHER): Payer: Medicare Other

## 2015-01-31 DIAGNOSIS — E785 Hyperlipidemia, unspecified: Secondary | ICD-10-CM | POA: Diagnosis not present

## 2015-01-31 DIAGNOSIS — Z0000 Encounter for general adult medical examination without abnormal findings: Secondary | ICD-10-CM

## 2015-01-31 LAB — POCT URINALYSIS DIPSTICK
Bilirubin, UA: NEGATIVE
Glucose, UA: NEGATIVE
Ketones, UA: NEGATIVE
LEUKOCYTES UA: NEGATIVE
NITRITE UA: NEGATIVE
PH UA: 5.5
Protein, UA: NEGATIVE
RBC UA: NEGATIVE
Spec Grav, UA: 1.02
UROBILINOGEN UA: 0.2

## 2015-01-31 LAB — LIPID PANEL
CHOLESTEROL: 163 mg/dL (ref 0–200)
HDL: 46.8 mg/dL (ref >39.00)
LDL Cholesterol: 95 mg/dL (ref 0–99)
NonHDL: 116.36
TRIGLYCERIDES: 108 mg/dL (ref 0.0–149.0)
Total CHOL/HDL Ratio: 3
VLDL: 21.6 mg/dL (ref 0.0–40.0)

## 2015-01-31 LAB — HEPATIC FUNCTION PANEL
ALBUMIN: 3.9 g/dL (ref 3.5–5.2)
ALK PHOS: 67 U/L (ref 39–117)
ALT: 11 U/L (ref 0–35)
AST: 14 U/L (ref 0–37)
BILIRUBIN TOTAL: 0.5 mg/dL (ref 0.2–1.2)
Bilirubin, Direct: 0.1 mg/dL (ref 0.0–0.3)
Total Protein: 6.6 g/dL (ref 6.0–8.3)

## 2015-01-31 LAB — TSH: TSH: 0.88 u[IU]/mL (ref 0.35–4.50)

## 2015-02-07 ENCOUNTER — Encounter: Payer: Self-pay | Admitting: Internal Medicine

## 2015-02-07 ENCOUNTER — Other Ambulatory Visit: Payer: Self-pay | Admitting: *Deleted

## 2015-02-07 ENCOUNTER — Ambulatory Visit (INDEPENDENT_AMBULATORY_CARE_PROVIDER_SITE_OTHER): Payer: Medicare Other | Admitting: Internal Medicine

## 2015-02-07 VITALS — BP 110/70 | HR 70 | Temp 98.8°F | Resp 18 | Ht 65.75 in | Wt 160.0 lb

## 2015-02-07 DIAGNOSIS — E785 Hyperlipidemia, unspecified: Secondary | ICD-10-CM

## 2015-02-07 DIAGNOSIS — I10 Essential (primary) hypertension: Secondary | ICD-10-CM

## 2015-02-07 DIAGNOSIS — F17200 Nicotine dependence, unspecified, uncomplicated: Secondary | ICD-10-CM

## 2015-02-07 DIAGNOSIS — Z0000 Encounter for general adult medical examination without abnormal findings: Secondary | ICD-10-CM

## 2015-02-07 DIAGNOSIS — I2510 Atherosclerotic heart disease of native coronary artery without angina pectoris: Secondary | ICD-10-CM

## 2015-02-07 DIAGNOSIS — Z720 Tobacco use: Secondary | ICD-10-CM | POA: Diagnosis not present

## 2015-02-07 MED ORDER — SIMVASTATIN 40 MG PO TABS: 40.0000 mg | ORAL_TABLET | Freq: Every day | ORAL | Status: DC

## 2015-02-07 MED ORDER — CELECOXIB 200 MG PO CAPS: 200.0000 mg | ORAL_CAPSULE | Freq: Every day | ORAL | Status: DC

## 2015-02-07 MED ORDER — TOLTERODINE TARTRATE ER 4 MG PO CP24: 4.0000 mg | ORAL_CAPSULE | Freq: Every day | ORAL | Status: DC

## 2015-02-07 MED ORDER — ZOLPIDEM TARTRATE 5 MG PO TABS: 5.0000 mg | ORAL_TABLET | Freq: Every evening | ORAL | Status: DC | PRN

## 2015-02-07 NOTE — Progress Notes (Signed)
Pre visit review using our clinic review tool, if applicable. No additional management support is needed unless otherwise documented below in the visit note. 

## 2015-02-07 NOTE — Patient Instructions (Signed)
Smoking tobacco is very bad for your health. You should stop smoking immediately.  Limit your sodium (Salt) intake    It is important that you exercise regularly, at least 20 minutes 3 to 4 times per week.  If you develop chest pain or shortness of breath seek  medical attention.  Take a calcium supplement, plus [phone] units of vitamin D  Schedule your colonoscopy to help detect colon cancer.  Health Maintenance Adopting a healthy lifestyle and getting preventive care can go a long way to promote health and wellness. Talk with your health care provider about what schedule of regular examinations is right for you. This is a good chance for you to check in with your provider about disease prevention and staying healthy. In between checkups, there are plenty of things you can do on your own. Experts have done a lot of research about which lifestyle changes and preventive measures are most likely to keep you healthy. Ask your health care provider for more information. WEIGHT AND DIET  Eat a healthy diet  Be sure to include plenty of vegetables, fruits, low-fat dairy products, and lean protein.  Do not eat a lot of foods high in solid fats, added sugars, or salt.  Get regular exercise. This is one of the most important things you can do for your health.  Most adults should exercise for at least 150 minutes each week. The exercise should increase your heart rate and make you sweat (moderate-intensity exercise).  Most adults should also do strengthening exercises at least twice a week. This is in addition to the moderate-intensity exercise.  Maintain a healthy weight  Body mass index (BMI) is a measurement that can be used to identify possible weight problems. It estimates body fat based on height and weight. Your health care provider can help determine your BMI and help you achieve or maintain a healthy weight.  For females 47 years of age and older:   A BMI below 18.5 is considered  underweight.  A BMI of 18.5 to 24.9 is normal.  A BMI of 25 to 29.9 is considered overweight.  A BMI of 30 and above is considered obese.  Watch levels of cholesterol and blood lipids  You should start having your blood tested for lipids and cholesterol at 66 years of age, then have this test every 5 years.  You may need to have your cholesterol levels checked more often if:  Your lipid or cholesterol levels are high.  You are older than 67 years of age.  You are at high risk for heart disease.  CANCER SCREENING   Lung Cancer  Lung cancer screening is recommended for adults 1-39 years old who are at high risk for lung cancer because of a history of smoking.  A yearly low-dose CT scan of the lungs is recommended for people who:  Currently smoke.  Have quit within the past 15 years.  Have at least a 30-pack-year history of smoking. A pack year is smoking an average of one pack of cigarettes a day for 1 year.  Yearly screening should continue until it has been 15 years since you quit.  Yearly screening should stop if you develop a health problem that would prevent you from having lung cancer treatment.  Breast Cancer  Practice breast self-awareness. This means understanding how your breasts normally appear and feel.  It also means doing regular breast self-exams. Let your health care provider know about any changes, no matter how small.  If  you are in your 20s or 30s, you should have a clinical breast exam (CBE) by a health care provider every 1-3 years as part of a regular health exam.  If you are 55 or older, have a CBE every year. Also consider having a breast X-ray (mammogram) every year.  If you have a family history of breast cancer, talk to your health care provider about genetic screening.  If you are at high risk for breast cancer, talk to your health care provider about having an MRI and a mammogram every year.  Breast cancer gene (BRCA) assessment is  recommended for women who have family members with BRCA-related cancers. BRCA-related cancers include:  Breast.  Ovarian.  Tubal.  Peritoneal cancers.  Results of the assessment will determine the need for genetic counseling and BRCA1 and BRCA2 testing. Cervical Cancer Routine pelvic examinations to screen for cervical cancer are no longer recommended for nonpregnant women who are considered low risk for cancer of the pelvic organs (ovaries, uterus, and vagina) and who do not have symptoms. A pelvic examination may be necessary if you have symptoms including those associated with pelvic infections. Ask your health care provider if a screening pelvic exam is right for you.   The Pap test is the screening test for cervical cancer for women who are considered at risk.  If you had a hysterectomy for a problem that was not cancer or a condition that could lead to cancer, then you no longer need Pap tests.  If you are older than 65 years, and you have had normal Pap tests for the past 10 years, you no longer need to have Pap tests.  If you have had past treatment for cervical cancer or a condition that could lead to cancer, you need Pap tests and screening for cancer for at least 20 years after your treatment.  If you no longer get a Pap test, assess your risk factors if they change (such as having a new sexual partner). This can affect whether you should start being screened again.  Some women have medical problems that increase their chance of getting cervical cancer. If this is the case for you, your health care provider may recommend more frequent screening and Pap tests.  The human papillomavirus (HPV) test is another test that may be used for cervical cancer screening. The HPV test looks for the virus that can cause cell changes in the cervix. The cells collected during the Pap test can be tested for HPV.  The HPV test can be used to screen women 74 years of age and older. Getting tested  for HPV can extend the interval between normal Pap tests from three to five years.  An HPV test also should be used to screen women of any age who have unclear Pap test results.  After 66 years of age, women should have HPV testing as often as Pap tests.  Colorectal Cancer  This type of cancer can be detected and often prevented.  Routine colorectal cancer screening usually begins at 66 years of age and continues through 66 years of age.  Your health care provider may recommend screening at an earlier age if you have risk factors for colon cancer.  Your health care provider may also recommend using home test kits to check for hidden blood in the stool.  A small camera at the end of a tube can be used to examine your colon directly (sigmoidoscopy or colonoscopy). This is done to check for the  earliest forms of colorectal cancer.  Routine screening usually begins at age 45.  Direct examination of the colon should be repeated every 5-10 years through 66 years of age. However, you may need to be screened more often if early forms of precancerous polyps or small growths are found. Skin Cancer  Check your skin from head to toe regularly.  Tell your health care provider about any new moles or changes in moles, especially if there is a change in a mole's shape or color.  Also tell your health care provider if you have a mole that is larger than the size of a pencil eraser.  Always use sunscreen. Apply sunscreen liberally and repeatedly throughout the day.  Protect yourself by wearing long sleeves, pants, a wide-brimmed hat, and sunglasses whenever you are outside. HEART DISEASE, DIABETES, AND HIGH BLOOD PRESSURE   Have your blood pressure checked at least every 1-2 years. High blood pressure causes heart disease and increases the risk of stroke.  If you are between 29 years and 56 years old, ask your health care provider if you should take aspirin to prevent strokes.  Have regular  diabetes screenings. This involves taking a blood sample to check your fasting blood sugar level.  If you are at a normal weight and have a low risk for diabetes, have this test once every three years after 66 years of age.  If you are overweight and have a high risk for diabetes, consider being tested at a younger age or more often. PREVENTING INFECTION  Hepatitis B  If you have a higher risk for hepatitis B, you should be screened for this virus. You are considered at high risk for hepatitis B if:  You were born in a country where hepatitis B is common. Ask your health care provider which countries are considered high risk.  Your parents were born in a high-risk country, and you have not been immunized against hepatitis B (hepatitis B vaccine).  You have HIV or AIDS.  You use needles to inject street drugs.  You live with someone who has hepatitis B.  You have had sex with someone who has hepatitis B.  You get hemodialysis treatment.  You take certain medicines for conditions, including cancer, organ transplantation, and autoimmune conditions. Hepatitis C  Blood testing is recommended for:  Everyone born from [birth year] through 1967.  Anyone with known risk factors for hepatitis C. Sexually transmitted infections (STIs)  You should be screened for sexually transmitted infections (STIs) including gonorrhea and chlamydia if:  You are sexually active and are younger than 66 years of age.  You are older than 66 years of age and your health care provider tells you that you are at risk for this type of infection.  Your sexual activity has changed since you were last screened and you are at an increased risk for chlamydia or gonorrhea. Ask your health care provider if you are at risk.  If you do not have HIV, but are at risk, it may be recommended that you take a prescription medicine daily to prevent HIV infection. This is called pre-exposure prophylaxis (PrEP). You are considered at  risk if:  You are sexually active and do not regularly use condoms or know the HIV status of your partner(s).  You take drugs by injection.  You are sexually active with a partner who has HIV. Talk with your health care provider about whether you are at high risk of being infected with HIV. If you choose  to begin PrEP, you should first be tested for HIV. You should then be tested every 3 months for as long as you are taking PrEP.  PREGNANCY   If you are premenopausal and you may become pregnant, ask your health care provider about preconception counseling.  If you may become pregnant, take 400 to 800 micrograms (mcg) of folic acid every day.  If you want to prevent pregnancy, talk to your health care provider about birth control (contraception). OSTEOPOROSIS AND MENOPAUSE   Osteoporosis is a disease in which the bones lose minerals and strength with aging. This can result in serious bone fractures. Your risk for osteoporosis can be identified using a bone density scan.  If you are 26 years of age or older, or if you are at risk for osteoporosis and fractures, ask your health care provider if you should be screened.  Ask your health care provider whether you should take a calcium or vitamin D supplement to lower your risk for osteoporosis.  Menopause may have certain physical symptoms and risks.  Hormone replacement therapy may reduce some of these symptoms and risks. Talk to your health care provider about whether hormone replacement therapy is right for you.  HOME CARE INSTRUCTIONS   Schedule regular health, dental, and eye exams.  Stay current with your immunizations.   Do not use any tobacco products including cigarettes, chewing tobacco, or electronic cigarettes.  If you are pregnant, do not drink alcohol.  If you are breastfeeding, limit how much and how often you drink alcohol.  Limit alcohol intake to no more than 1 drink per day for nonpregnant women. One drink equals  12 ounces of beer, 5 ounces of wine, or 1 ounces of hard liquor.  Do not use street drugs.  Do not share needles.  Ask your health care provider for help if you need support or information about quitting drugs.  Tell your health care provider if you often feel depressed.  Tell your health care provider if you have ever been abused or do not feel safe at home. Document Released: 11/30/2010 Document Revised: 10/01/2013 Document Reviewed: 04/18/2013 [facility] Patient Information 2015 [place], [place]. This information is not intended to replace advice given to you by your health care provider. Make sure you discuss any questions you have with your health care provider.

## 2015-02-07 NOTE — Progress Notes (Signed)
Subjective:    Patient ID: Tara Cruz, female    DOB: August 29, 1950, 64 y.o.   MRN: [id number]  HPI   64 -year-old patient who is seen today for a preventive health examination She was evaluated by cardiology, which included a heart catheterization.  This revealed single-vessel disease, and the patient has been treated medically.  She has dyslipidemia and a history of mild asthma, COPD.  She continues to smoke but now only a few cigarettes daily.  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  She remains quite active.  Had a lumbar laminectomy performed last month without complications.  Very pleased with her progress  Last colonoscopy 2006  PFTs August 2015  Carotid artery duplex February 2014   Past Medical History  Diagnosis Date  . Allergy   . Hyperlipidemia   . Asthma   . Hypertension   . Right bundle branch block   . Abnormal stress test 01/03/2013    false positive by Dr. Allyson Sabal  . CAD (coronary artery disease) 01/30/2013    single vessel disease of small second diagonal branch, normal EF 65%  . Tobacco use     has tried to stop numerous times  . H/O cardiovascular stress test 2014  . Arthritis     degenerative lumbar spine   . COPD (chronic obstructive pulmonary disease)     reviewed by Dr. Sherene Sires    Social History   Social History  . Marital Status: Significant Other    Spouse Name: N/A  . Number of Children: N/A  . Years of Education: N/A   Occupational History  . Retired [employer]   Social History Main Topics  . Smoking status: Current Every Day Smoker -- 0.25 packs/day for 50 years    Types: Cigarettes  . Smokeless tobacco: Never Used  . Alcohol Use: Yes     Comment: wine- on weekends   . Drug Use: No  . Sexual Activity: Yes   Other Topics Concern  . Not on file   Social History Narrative    Past Surgical History  Procedure Laterality Date  . Abdominal hysterectomy    . Foot surgery      for plantar fascitis    . Cardiac catheterization  01/30/2013    2nd small diag branch with 95% stentosis, too small for intervention.  No disease in other vessels.  . Left heart catheterization with coronary angiogram N/A 01/30/2013    Procedure: LEFT HEART CATHETERIZATION WITH CORONARY ANGIOGRAM;  Surgeon: Runell Gess, MD;  Location: [facility] CATH LAB;  Service: Cardiovascular;  Laterality: N/A;  . Hand surgery Left   . Eye surgery      cataracts removed, /w IOL- bilateral, yag laser  . Tubal ligation      Family History  Problem Relation Age of Onset  . Coronary artery disease Mother   . Alzheimer's disease Mother   . Coronary artery disease Father   . Breast cancer Sister   . Rheum arthritis Mother   . Asthma Sister     No Known Allergies  Current Outpatient Prescriptions on File Prior to Visit  Medication Sig Dispense Refill  . aspirin 81 MG tablet Take 81 mg by mouth daily.      . Calcium Carbonate-Vitamin D (CALCIUM + D PO) Take 1 tablet by mouth daily.    . cetirizine (ZYRTEC) 10 MG tablet Take 10 mg by mouth daily.     Marland Kitchen estradiol (ESTRACE) 0.5 MG  tablet Take 1 tablet (0.5 mg total) by mouth daily. 90 tablet 3  . ezetimibe-simvastatin (VYTORIN) 10-20 MG per tablet Take 1 tablet by mouth daily. (Patient taking differently: Take 1 tablet by mouth at bedtime. ) 90 tablet 2  . Glucosamine HCl (GLUCOSAMINE PO) Take 1,500 capsules by mouth daily. With MSM    . Omega-3 Fatty Acids (FISH OIL) 1200 MG CAPS Take 1 capsule by mouth daily.    . Tolterodine Tartrate (DETROL LA PO) Take 4 mg by mouth daily before breakfast.     . vitamin E 400 UNIT capsule Take 400 Units by mouth daily.    . [DISCONTINUED] DETROL LA 2 MG 24 hr capsule TAKE 1 CAPSULE DAILY 90 capsule 2   No current facility-administered medications on file prior to visit.    There were no vitals taken for this visit.  1. Risk factors, based on past  M,S,F history.  Patient has single-vessel coronary artery disease.  Cardiovascular risk  factors include ongoing tobacco use, hypertension and dyslipidemia.  Patient also is a family history of coronary artery disease  2.  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  Depression/mood: History of adjustment disorder but no major depression  4.  Hearing: No deficits  5.  ADL's: Independent  6.  Fall risk: Low  7.  Home safety: None identified  8.  Height weight, and visual acuity; height and weight stable no change in visual acuity  9.  Counseling: Total smoking cessation encouraged.  Heart healthy diet regular exercise all recommended yearly eye examination and GYN evaluation.  Also encouraged  10. Lab orders based on risk factors: Laboratory profile including lipid panel reviewed  11. Referral : Follow-up pulmonary medicine OB/GYN and ophthalmology  12. Care plan: Continue efforts at aggressive risk factor modification with smoking cessation  13. Cognitive assessment: Alert and oriented with normal affect.  No cognitive dysfunction  14. Screening: Patient provided with a written and personalized 5-10 year screening schedule in the AVS.  .  We'll continue annual health exam with screening lab.  Colonoscopy will be scheduled this year and walking 10 you.  10 year intervals.  Annual mammograms.  Encouraged, as well as yearly GYN evaluations    15. Provider List Update: Includes primary care medicine, pulmonary OB/GYN radiology and ophthalmology      Review of Systems  Constitutional: Negative for fever, appetite change, fatigue and unexpected weight change.  HENT: Negative for congestion, dental problem, ear pain, hearing loss, mouth sores, nosebleeds, sinus pressure, sore throat, tinnitus, trouble swallowing and voice change.   Eyes: Negative for photophobia, pain, redness and visual disturbance.  Respiratory: Negative for cough, chest tightness and shortness of breath.   Cardiovascular: Negative for chest  pain, palpitations and leg swelling.  Gastrointestinal: Negative for nausea, vomiting, abdominal pain, diarrhea, constipation, blood in stool, abdominal distention and rectal pain.  Genitourinary: Negative for dysuria, urgency, frequency, hematuria, flank pain, vaginal bleeding, vaginal discharge, difficulty urinating, genital sores, vaginal pain, menstrual problem and pelvic pain.  Musculoskeletal: Positive for back pain and arthralgias. Negative for neck stiffness.  Skin: Negative for rash.  Neurological: Negative for dizziness, syncope, speech difficulty, weakness, light-headedness, numbness and headaches.  Hematological: Negative for adenopathy. Does not bruise/bleed easily.  Psychiatric/Behavioral: Negative for suicidal ideas, behavioral problems, self-injury, dysphoric mood and agitation. The patient is not nervous/anxious.        Objective:   Physical Exam  Constitutional: She is oriented to person, place, and  time. She appears well-developed and well-nourished.  HENT:  Head: Normocephalic and atraumatic.  Right Ear: External ear normal.  Left Ear: External ear normal.  Mouth/Throat: Oropharynx is clear and moist.  Eyes: Conjunctivae and EOM are normal.  Neck: Normal range of motion. Neck supple. No JVD present. No thyromegaly present.  Cardiovascular: Normal rate, regular rhythm, normal heart sounds and intact distal pulses.   No murmur heard. The left posterior tibial pulse, slightly diminished  Pulmonary/Chest: Effort normal and breath sounds normal. She has no wheezes. She has no rales.  Abdominal: Soft. Bowel sounds are normal. She exhibits no distension and no mass. There is no tenderness. There is no rebound and no guarding.  Genitourinary: Vagina normal.  Musculoskeletal: Normal range of motion. She exhibits no edema or tenderness.  Neurological: She is alert and oriented to person, place, and time. She has normal reflexes. No cranial nerve deficit. She exhibits normal  muscle tone. Coordination normal.  Skin: Skin is warm and dry. No rash noted.  Nicely healed lumbar scar  Psychiatric: She has a normal mood and affect. Her behavior is normal.          Assessment & Plan:   Preventive health examination Single vessel coronary artery disease Dyslipidemia Hypertension Mild COPD Ongoing tobacco use.  Total smoking cessation encouraged  She will have followup with GYN Colonoscopy   Medications updated/flu vaccine declined.

## 2015-02-10 NOTE — Discharge Summary (Addendum)
Patient ID: Tara Cruz MRN: [id number] DOB/AGE: 66-Dec-1952 66 y.o.  Admit date: 01/15/2015 Discharge date: 02/10/2015  Admission Diagnoses:  Active Problems:   Back pain   Discharge Diagnoses:  Active Problems:   Back pain  status post Procedure(s): L3-4 GIL DECOMPRESSION AND POSTERIOR SPINAL FUSION ANTIBODY L3-4  Past Medical History  Diagnosis Date  . Allergy   . Hyperlipidemia   . Asthma   . Hypertension   . Right bundle branch block   . Abnormal stress test 01/03/2013    false positive by Dr. Allyson Sabal  . CAD (coronary artery disease) 01/30/2013    single vessel disease of small second diagonal branch, normal EF 65%  . Tobacco use     has tried to stop numerous times  . H/O cardiovascular stress test 2014  . Arthritis     degenerative lumbar spine   . COPD (chronic obstructive pulmonary disease)     reviewed by Dr. Sherene Sires    Surgeries: Procedure(s): L3-4 GIL DECOMPRESSION AND POSTERIOR SPINAL FUSION ANTIBODY L3-4 on 01/15/2015   Consultants:    Discharged Condition: Improved  Hospital Course: Tara Cruz is an 64 y.o. 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 Patient failed conservative treatments (please see the history and physical for the specifics) and had severe unremitting pain that affects sleep, daily activities and work/hobbies. After pre-op clearance, the patient was taken to the operating room on 01/15/2015 and underwent  Procedure(s): L3-4 GIL DECOMPRESSION AND POSTERIOR SPINAL FUSION ANTIBODY L3-4.  The pt had an uneventful hospital course and was discharged on 01/16/15. Patient was given perioperative antibiotics:  Anti-infectives    Start     Dose/Rate Route Frequency Ordered Stop   01/15/15 1630  ceFAZolin (ANCEF) IVPB 1 g/50 mL premix     1 g 100 mL/hr over 30 Minutes Intravenous Every 8 hours 01/15/15 1515 01/16/15 0829   01/15/15 0800  ceFAZolin (ANCEF)  IVPB 2 g/50 mL premix     2 g 100 mL/hr over 30 Minutes Intravenous To [facility]Stay [facility] 01/14/15 1056 01/15/15 0845       Patient was given sequential compression devices and early ambulation to prevent DVT.   Patient benefited maximally from hospital stay and there were no complications. At the time of discharge, the patient was urinating/moving their bowels without difficulty, tolerating a regular diet, pain is controlled with oral pain medications and they have been cleared by PT/OT.   Recent vital signs: No data found.    Recent laboratory studies: No results for input(s): WBC, HGB, HCT, PLT, NA, K, CL, CO2, BUN, CREATININE, GLUCOSE, INR, CALCIUM in the last 72 hours.  Invalid input(s): PT, 2   Discharge Medications:     Medication List    STOP taking these medications        celecoxib 200 MG capsule  Commonly known as:  CELEBREX     ezetimibe-simvastatin 10-20 MG per tablet  Commonly known as:  VYTORIN     Fish Oil 1200 MG Caps     GLUCOSAMINE PO     lidocaine 5 % ointment  Commonly known as:  XYLOCAINE     vitamin E 400 UNIT capsule      TAKE these medications        aspirin 81 MG tablet  Take 81 mg by mouth daily.     CALCIUM + D PO  Take 1 tablet by mouth daily.  cetirizine 10 MG tablet  Commonly known as:  ZYRTEC  Take 10 mg by mouth daily.     DETROL LA PO  Take 4 mg by mouth daily before breakfast.     estradiol 0.5 MG tablet  Commonly known as:  ESTRACE  Take 1 tablet (0.5 mg total) by mouth daily.      ASK your doctor about these medications        zolpidem 10 MG tablet  Commonly known as:  AMBIEN  Take 0.5 tablets (5 mg total) by mouth at bedtime as needed for sleep.  Ask about: Should I take this medication?        Diagnostic Studies: Dg Lumbar Spine 2-3 Views  01/16/2015   CLINICAL DATA:  Post- operative from posterior lumbar fusion and intradiscal device placement on January 15, 2015  EXAM: LUMBAR SPINE - 2-3 VIEW   COMPARISON:  Fluoro spot images of January 15, 2015  FINDINGS: The lumbar vertebral bodies are preserved in height. The fusion hardware at L3-4 is normal. There is no spondylolisthesis. The bony structures elsewhere unremarkable. The bowel gas pattern is normal.  IMPRESSION: There is no postprocedure complication following L3-4 fusion. The metallic hardware is in reasonable position.   Electronically Signed   By: David  [place] M.D.   On: 01/16/2015 07:44   Dg Lumbar Spine 2-3 Views  01/15/2015   CLINICAL DATA:  Status post spinal fusion L3 and L4  EXAM: LUMBAR SPINE - 2-3 VIEW; DG C-ARM 61-120 MIN  COMPARISON:  None.  FLUOROSCOPY TIME:  2 minutes 50 seconds; 2 submitted images  FINDINGS: Frontal and lateral views obtained. There is pedicle screw and plate fixation at L3 and L4 with the tips of the screws in the respective vertebral bodies. There is a disc spacer at L3-4. No fracture or spondylolisthesis. Visualized disc spaces appear intact.  IMPRESSION: Postoperative change with screw and plate fixation posteriorly at L3 and L4 with new hardware intact. Disc spacer L3-4. No fracture or spondylolisthesis. No appreciable disc space narrowing.   Electronically Signed   By: Bretta Bang III M.D.   On: 01/15/2015 12:02   Dg C-arm 61-120 Min  01/15/2015   CLINICAL DATA:  Status post spinal fusion L3 and L4  EXAM: LUMBAR SPINE - 2-3 VIEW; DG C-ARM 61-120 MIN  COMPARISON:  None.  FLUOROSCOPY TIME:  2 minutes 50 seconds; 2 submitted images  FINDINGS: Frontal and lateral views obtained. There is pedicle screw and plate fixation at L3 and L4 with the tips of the screws in the respective vertebral bodies. There is a disc spacer at L3-4. No fracture or spondylolisthesis. Visualized disc spaces appear intact.  IMPRESSION: Postoperative change with screw and plate fixation posteriorly at L3 and L4 with new hardware intact. Disc spacer L3-4. No fracture or spondylolisthesis. No appreciable disc space narrowing.    Electronically Signed   By: Bretta Bang III M.D.   On: 01/15/2015 12:02          Follow-up Information    Follow up with Alvy Beal, MD In 2 weeks.   Specialty:  Orthopedic Surgery   Why:  For suture removal, For wound re-check, If symptoms worsen   Contact information:   [address] [place] [address] [phone]       Discharge Plan:  discharge to home     Signed: [facility] Kail for Dr. Venita Lick [facility] [phone] 02/10/2015, 1:08 PM

## 2015-05-13 ENCOUNTER — Other Ambulatory Visit: Payer: Self-pay | Admitting: Internal Medicine

## 2015-05-13 NOTE — Telephone Encounter (Signed)
ok 

## 2015-05-13 NOTE — Telephone Encounter (Signed)
Pt last visit 02/07/15 Pt last refill 02/07/15 #90 with one refill

## 2015-07-22 ENCOUNTER — Telehealth: Payer: Self-pay | Admitting: Internal Medicine

## 2015-07-22 NOTE — Telephone Encounter (Signed)
Patient need a Prior Auth for medications Celecoxib 200 mg  Tolterodine 4 mg.

## 2015-07-22 NOTE — Telephone Encounter (Signed)
Tara Cruz, have you seen Prior Auth for this pt?

## 2015-07-24 NOTE — Telephone Encounter (Signed)
I have not.  However, I attempted to initiate prior authorizations for these meds and realized we did not have the appropriate insurance information on file for the patient.  I contacted the patient to obtain information and will submit prior auths now.

## 2015-07-25 NOTE — Telephone Encounter (Signed)
Noted  

## 2015-07-29 ENCOUNTER — Other Ambulatory Visit: Payer: Self-pay

## 2015-07-29 ENCOUNTER — Telehealth: Payer: Self-pay | Admitting: *Deleted

## 2015-07-29 ENCOUNTER — Telehealth: Payer: Self-pay

## 2015-07-29 MED ORDER — CELECOXIB 200 MG PO CAPS: 200.0000 mg | ORAL_CAPSULE | Freq: Every day | ORAL | Status: DC

## 2015-07-29 NOTE — Telephone Encounter (Signed)
Called patients pharmacy, left message for mybertiq number 90 one daily

## 2015-07-29 NOTE — Telephone Encounter (Signed)
Dr.K, what dosage of Myrbetriq do you want?

## 2015-07-29 NOTE — Telephone Encounter (Signed)
Mybertiq  #90 one tablet daily

## 2015-07-29 NOTE — Telephone Encounter (Deleted)
Dr.K. Pt's insurance will cover Mybetriqe

## 2015-07-29 NOTE — Telephone Encounter (Signed)
Please advise, patients insurance denied request for Tolterodine. This medication is not on the drug list formulary for this patients insurance.

## 2015-07-29 NOTE — Telephone Encounter (Addendum)
Dr.K, pt insurance will cover Mybertiq, Vesicare and Oxybutin. Please advise.

## 2015-07-30 MED ORDER — MIRABEGRON ER 25 MG PO TB24: 25.0000 mg | ORAL_TABLET | Freq: Every day | ORAL | Status: DC

## 2015-07-30 NOTE — Telephone Encounter (Signed)
Rx sent to pharmacy   

## 2015-07-30 NOTE — Telephone Encounter (Signed)
25 mg once daily

## 2015-08-01 ENCOUNTER — Telehealth: Payer: Self-pay | Admitting: Internal Medicine

## 2015-08-01 NOTE — Telephone Encounter (Signed)
Pt call to say [facility] said she need a PA for the following med tolterodine (DETROL LA) 4 MG 24 hr capsule     [phone]

## 2015-08-04 NOTE — Telephone Encounter (Signed)
PA has been submitted - will await determination.  

## 2015-08-12 NOTE — Telephone Encounter (Signed)
Pt called back and gave her info concerning med approval.

## 2015-08-12 NOTE — Telephone Encounter (Signed)
PA has been approved. Left message for patient to return phone call.

## 2015-08-22 ENCOUNTER — Encounter: Payer: Self-pay | Admitting: Internal Medicine

## 2015-08-22 ENCOUNTER — Ambulatory Visit (INDEPENDENT_AMBULATORY_CARE_PROVIDER_SITE_OTHER): Payer: Medicare Other | Admitting: Internal Medicine

## 2015-08-22 VITALS — BP 140/80 | HR 78 | Temp 97.9°F | Resp 20 | Ht 65.75 in | Wt 161.0 lb

## 2015-08-22 DIAGNOSIS — R531 Weakness: Secondary | ICD-10-CM | POA: Diagnosis not present

## 2015-08-22 DIAGNOSIS — J449 Chronic obstructive pulmonary disease, unspecified: Secondary | ICD-10-CM | POA: Diagnosis not present

## 2015-08-22 DIAGNOSIS — I10 Essential (primary) hypertension: Secondary | ICD-10-CM

## 2015-08-22 DIAGNOSIS — J4520 Mild intermittent asthma, uncomplicated: Secondary | ICD-10-CM | POA: Diagnosis not present

## 2015-08-22 DIAGNOSIS — J3089 Other allergic rhinitis: Secondary | ICD-10-CM

## 2015-08-22 LAB — CBC WITH DIFFERENTIAL/PLATELET
BASOS PCT: 0.4 % (ref 0.0–3.0)
Basophils Absolute: 0 10*3/uL (ref 0.0–0.1)
EOS PCT: 1.5 % (ref 0.0–5.0)
Eosinophils Absolute: 0.1 10*3/uL (ref 0.0–0.7)
HCT: 41.9 % (ref 36.0–46.0)
Hemoglobin: 14 g/dL (ref 12.0–15.0)
LYMPHS ABS: 1.1 10*3/uL (ref 0.7–4.0)
Lymphocytes Relative: 21.5 % (ref 12.0–46.0)
MCHC: 33.5 g/dL (ref 30.0–36.0)
MCV: 93.2 fl (ref 78.0–100.0)
MONO ABS: 0.6 10*3/uL (ref 0.1–1.0)
MONOS PCT: 12.2 % — AB (ref 3.0–12.0)
NEUTROS ABS: 3.4 10*3/uL (ref 1.4–7.7)
NEUTROS PCT: 64.4 % (ref 43.0–77.0)
PLATELETS: 273 10*3/uL (ref 150.0–400.0)
RBC: 4.5 Mil/uL (ref 3.87–5.11)
RDW: 15.2 % (ref 11.5–15.5)
WBC: 5.3 10*3/uL (ref 4.0–10.5)

## 2015-08-22 LAB — COMPREHENSIVE METABOLIC PANEL
ALK PHOS: 59 U/L (ref 39–117)
ALT: 24 U/L (ref 0–35)
AST: 27 U/L (ref 0–37)
Albumin: 3.8 g/dL (ref 3.5–5.2)
BUN: 30 mg/dL — AB (ref 6–23)
CO2: 29 meq/L (ref 19–32)
Calcium: 9.9 mg/dL (ref 8.4–10.5)
Chloride: 107 mEq/L (ref 96–112)
Creatinine, Ser: 0.97 mg/dL (ref 0.40–1.20)
GFR: 60.88 mL/min (ref >60.00)
GLUCOSE: 78 mg/dL (ref 70–99)
POTASSIUM: 4.3 meq/L (ref 3.5–5.1)
SODIUM: 141 meq/L (ref 135–145)
TOTAL PROTEIN: 6.6 g/dL (ref 6.0–8.3)
Total Bilirubin: 0.5 mg/dL (ref 0.2–1.2)

## 2015-08-22 LAB — SEDIMENTATION RATE: SED RATE: 7 mm/h (ref 0–22)

## 2015-08-22 MED ORDER — FLUTICASONE PROPIONATE 50 MCG/ACT NA SUSP: 2.0000 | Freq: Every day | NASAL | Status: DC

## 2015-08-22 MED ORDER — TERBINAFINE HCL 250 MG PO TABS: 250.0000 mg | ORAL_TABLET | Freq: Every day | ORAL | Status: DC

## 2015-08-22 NOTE — Progress Notes (Signed)
Pre visit review using our clinic review tool, if applicable. No additional management support is needed unless otherwise documented below in the visit note. 

## 2015-08-22 NOTE — Patient Instructions (Addendum)
Smoking tobacco is very bad for your health. You should stop smoking immediately.  Acute bronchitis symptoms  are generally not helped by antibiotics.  Take over-the-counter expectorants and cough medications such as  Mucinex DM.  Call if there is no improvement in 5 to 7 days or if  you develop worsening cough, fever, or new symptoms, such as shortness of breath or chest pain.  Call or return to clinic prn if these symptoms worsen or fail to improve as anticipated.   Avoids foods high in acid such as tomatoes citrus juices, and spicy foods.  Avoid eating within two hours of lying down or before exercising.  Do not overheat.  Try smaller more frequent meals.  If symptoms persist, elevate the head of her bed 12 inches while sleeping.

## 2015-08-22 NOTE — Progress Notes (Signed)
Subjective:    Patient ID: Tara Cruz, female    DOB: July 15, 1950, 65 y.o.   MRN: [id number]  HPI 65 year old patient who is seen today for her six-month follow-up.  She states that she has had a cough for about 2 months.  She also complains some epigastric discomfort usually postprandial.  She has essential hypertension and a history of asthma.  She continues to smoke a few cigarettes daily.  Cough is occasionally mildly productive.  Denies any active wheezing.  She also complains of a general sense of unwellness.  Patient has tried a number of topical and over the counter measures for   Toenail fungus.  She describes some pain and loosening of the nails and is requesting treatment  Past Medical History  Diagnosis Date  . Allergy   . Hyperlipidemia   . Asthma   . Hypertension   . Right bundle branch block   . Abnormal stress test 01/03/2013    false positive by Dr. Allyson Sabal  . CAD (coronary artery disease) 01/30/2013    single vessel disease of small second diagonal branch, normal EF 65%  . Tobacco use     has tried to stop numerous times  . H/O cardiovascular stress test 2014  . Arthritis     degenerative lumbar spine   . COPD (chronic obstructive pulmonary disease) (HCC)     reviewed by Dr. Sherene Sires    Social History   Social History  . Marital Status: Significant Other    Spouse Name: N/A  . Number of Children: N/A  . Years of Education: N/A   Occupational History  . Retired [employer]   Social History Main Topics  . Smoking status: Current Every Day Smoker -- 0.25 packs/day for 50 years    Types: Cigarettes  . Smokeless tobacco: Never Used  . Alcohol Use: Yes     Comment: wine- on weekends   . Drug Use: No  . Sexual Activity: Yes   Other Topics Concern  . Not on file   Social History Narrative    Past Surgical History  Procedure Laterality Date  . Abdominal hysterectomy    . Foot surgery      for plantar fascitis   . Cardiac catheterization   01/30/2013    2nd small diag branch with 95% stentosis, too small for intervention.  No disease in other vessels.  . Left heart catheterization with coronary angiogram N/A 01/30/2013    Procedure: LEFT HEART CATHETERIZATION WITH CORONARY ANGIOGRAM;  Surgeon: Runell Gess, MD;  Location: [facility] CATH LAB;  Service: Cardiovascular;  Laterality: N/A;  . Hand surgery Left   . Eye surgery      cataracts removed, /w IOL- bilateral, yag laser  . Tubal ligation      Family History  Problem Relation Age of Onset  . Coronary artery disease Mother   . Alzheimer's disease Mother   . Coronary artery disease Father   . Breast cancer Sister   . Rheum arthritis Mother   . Asthma Sister     No Known Allergies  Current Outpatient Prescriptions on File Prior to Visit  Medication Sig Dispense Refill  . aspirin 81 MG tablet Take 81 mg by mouth daily.      . Calcium Carbonate-Vitamin D (CALCIUM + D PO) Take 1 tablet by mouth daily.    . celecoxib (CELEBREX) 200 MG capsule Take 1 capsule (200 mg total) by mouth daily. 90 capsule 3  . cetirizine (ZYRTEC) 10  MG tablet Take 10 mg by mouth daily.     Marland Kitchen. estradiol (ESTRACE) 0.5 MG tablet Take 1 tablet (0.5 mg total) by mouth daily. 90 tablet 3  . simvastatin (ZOCOR) 40 MG tablet Take 1 tablet (40 mg total) by mouth at bedtime. 90 tablet 3  . tolterodine (DETROL LA) 4 MG 24 hr capsule Take 1 capsule (4 mg total) by mouth daily. 90 capsule 3  . Tolterodine Tartrate (DETROL LA PO) Take 4 mg by mouth daily before breakfast.     . zolpidem (AMBIEN) 10 MG tablet TAKE ONE-HALF TABLET BY MOUTH ONCE DAILY AT BEDTIME AS NEEDED FOR SLEEP 30 tablet 0  . zolpidem (AMBIEN) 5 MG tablet Take 1 tablet (5 mg total) by mouth at bedtime as needed for sleep. 90 tablet 1  . mirabegron ER (MYRBETRIQ) 25 MG TB24 tablet Take 1 tablet (25 mg total) by mouth daily. (Patient not taking: Reported on 08/22/2015) 90 tablet 1  . [DISCONTINUED] DETROL LA 2 MG 24 hr capsule TAKE 1 CAPSULE DAILY 90  capsule 2   No current facility-administered medications on file prior to visit.    BP 140/80 mmHg  Pulse 78  Temp(Src) 97.9 F (36.6 C) (Oral)  Resp 20  Ht 5' 5.75" (1.67 m)  Wt 161 lb (73.029 kg)  BMI 26.19 kg/m2  SpO2 98%      Review of Systems  Constitutional: Positive for activity change and fatigue.  HENT: Negative for congestion, dental problem, hearing loss, rhinorrhea, sinus pressure, sore throat and tinnitus.   Eyes: Negative for pain, discharge and visual disturbance.  Respiratory: Positive for cough. Negative for shortness of breath.   Cardiovascular: Positive for leg swelling. Negative for chest pain and palpitations.  Gastrointestinal: Negative for nausea, vomiting, abdominal pain, diarrhea, constipation, blood in stool and abdominal distention.  Genitourinary: Negative for dysuria, urgency, frequency, hematuria, flank pain, vaginal bleeding, vaginal discharge, difficulty urinating, vaginal pain and pelvic pain.  Musculoskeletal: Negative for joint swelling, arthralgias and gait problem.  Skin: Negative for rash.  Neurological: Negative for dizziness, syncope, speech difficulty, weakness, numbness and headaches.  Hematological: Negative for adenopathy.  Psychiatric/Behavioral: Negative for behavioral problems, dysphoric mood and agitation. The patient is not nervous/anxious.        Objective:   Physical Exam  Constitutional: She is oriented to person, place, and time. She appears well-developed and well-nourished.  HENT:  Head: Normocephalic.  Right Ear: External ear normal.  Left Ear: External ear normal.  Mouth/Throat: Oropharynx is clear and moist.  Eyes: Conjunctivae and EOM are normal. Pupils are equal, round, and reactive to light.  Neck: Normal range of motion. Neck supple. No thyromegaly present.  Cardiovascular: Normal rate, regular rhythm, normal heart sounds and intact distal pulses.   Pulmonary/Chest: Effort normal and breath sounds normal. No  respiratory distress. She has no wheezes. She has no rales.  Abdominal: Soft. Bowel sounds are normal. She exhibits no mass. There is no tenderness.  Musculoskeletal: Normal range of motion. She exhibits edema.  Mild left ankle edema  Lymphadenopathy:    She has no cervical adenopathy.  Neurological: She is alert and oriented to person, place, and time.  Skin: Skin is warm and dry. No rash noted.  Onychomycotic toenail changes with some loosening of the nails  Psychiatric: She has a normal mood and affect. Her behavior is normal.          Assessment & Plan:   Asthma/chronic cough.  Total smoking cessation encouraged.  Will place on  short-term Advair.  Samples provided Low-grade smoker.  Total smoking cessation encouraged Allergic rhinitis.  Continue Zyrtec and fluticasone Fatigue.  We'll check some updated lab including sedimentation rate and CPK Toenail onychomycosis.  Will treat with Lamisil for 12 weeks  Gerd.  We'll place on antireflux regimen and short-term PPI therapy.  Samples provided

## 2015-08-23 ENCOUNTER — Telehealth: Payer: Self-pay | Admitting: Family Medicine

## 2015-08-23 LAB — CK TOTAL AND CKMB (NOT AT ARMC)
CK TOTAL: 191 U/L — AB (ref 7–177)
CK, MB: 6.9 ng/mL — ABNORMAL HIGH (ref 0.0–5.0)
Relative Index: 3.6 (ref 0.0–4.0)

## 2015-08-26 ENCOUNTER — Telehealth: Payer: Self-pay

## 2015-08-26 NOTE — Telephone Encounter (Signed)
Error

## 2015-08-26 NOTE — Telephone Encounter (Signed)
Dr. K, please see message and advise. 

## 2015-08-26 NOTE — Telephone Encounter (Signed)
[facility] Night - Client TELEPHONE ADVICE RECORD [facility] Patient Name: Tara Cruz Gender: Female DOB: 1951/03/09 Age: 65 Y 13 D Return Phone Number: [phone] (Primary) Address: City/State/Zip: [place] Client [facility] Night - Client Client Site [facility] - Night Physician Derryl HarborKwiatkowski, Pete Contact Type Call Who Is Calling Lab [facility] Phone Number [phone] opt 2 Lab Tech Name Nicanor AlconKarnita Lab Reference Number [id number] Chief Complaint Lab Result (Critical or Stat) Call Type Lab Send to RN Reason for Call Report lab results Initial Comment Caller states Nicanor AlconKarnita from [facility] with stat lab values to report. Additional Comment Translation No Nurse Assessment Nurse: Logan BoresEvans, RN, Melissa Date/Time (Eastern Time): 08/23/2015 4:22:06 PM Is there an on-call provider listed? ---Yes Please list name of person reporting value (Lab Employee) and a contact number. ---Caller states Nicanor AlconKarnita from [facility] with stat lab values to report. Please document the following items: Lab name Lab value (read back to lab to verify) Reference range for lab value Date and time blood was drawn Collect time of birth for bilirubin results ---[facility] Alert High 6.9 Ref. Range 0.0-5.0 Collected 08/23/2015 @ 12:07pm Please collect the patient contact information from the lab. (name, phone number and address) ---[phone] Guidelines Guideline Title Affirmed Question Affirmed Notes Nurse Date/Time (Eastern Time) Disp. Time Lamount Cohen(Eastern Time) Disposition Final User 08/23/2015 4:25:17 PM Paged On Call back to Call Ellendaleenter Evans, RN, Efraim KaufmannMelissa 08/23/2015 4:30:30 PM Clinical Call Yes Logan BoresEvans, RN, Melissa PLEASE NOTE: All timestamps contained within this report are represented as [place] Standard Time. CONFIDENTIALTY NOTICE: This fax transmission is intended only for the addressee. It contains information that  is legally privileged, confidential or otherwise protected from use or disclosure. If you are not the intended recipient, you are strictly prohibited from reviewing, disclosing, copying using or disseminating any of this information or taking any action in reliance on or regarding this information. If you have received this fax in error, please notify us immediately by telephone so that we can arrange for its return to us. Phone: [phone], Toll-Free: [phone], Fax: [phone] Page: 2 of 2 Call Id: [id number] Paging DoctorName Phone DateTime Result/Outcome Message Type Notes Santiago BumpersMcGowen, Phil [phone] 08/23/2015 4:25:17 PM Paged On Call Back to Call Center Doctor Paged call Melissa @ [phone] stat lab result Santiago BumpersMcGowen, Phil 08/23/2015 4:30:22 PM Spoke with On Call - General Message Result informed of the CKMB alerted high result of 6.9 collected yesterday @ 12:07 08/23/2015. Ref. Range 0.0-5.0. Pt's information including contact number given [phone]. Dr. verb. understood by repeating results and information given back. No other alerts were given by [facility] lab tech who called.

## 2015-10-02 ENCOUNTER — Telehealth: Payer: Self-pay | Admitting: Internal Medicine

## 2015-10-02 ENCOUNTER — Telehealth: Payer: Self-pay | Admitting: *Deleted

## 2015-10-02 ENCOUNTER — Ambulatory Visit (INDEPENDENT_AMBULATORY_CARE_PROVIDER_SITE_OTHER)
Admission: RE | Admit: 2015-10-02 | Discharge: 2015-10-02 | Disposition: A | Discharge status: Home / Self Care | Payer: Medicare Other | Source: Ambulatory Visit | Attending: Internal Medicine | Admitting: Internal Medicine

## 2015-10-02 DIAGNOSIS — Z79890 Hormone replacement therapy (postmenopausal): Secondary | ICD-10-CM

## 2015-10-02 DIAGNOSIS — R0781 Pleurodynia: Secondary | ICD-10-CM

## 2015-10-02 DIAGNOSIS — N644 Mastodynia: Secondary | ICD-10-CM

## 2015-10-02 DIAGNOSIS — Z780 Asymptomatic menopausal state: Secondary | ICD-10-CM | POA: Diagnosis not present

## 2015-10-02 DIAGNOSIS — N951 Menopausal and female climacteric states: Secondary | ICD-10-CM

## 2015-10-02 NOTE — Telephone Encounter (Signed)
[facility] pt's Chest x-ray was told to call with report by Physician there. He asked me if I can see it in EPIC. Pulled up pt's chart and said yes, I am able to see report. Sar said okay and hung up.

## 2015-10-02 NOTE — Telephone Encounter (Signed)
Dr. Kirtland BouchardK, please review pt's Chest x-ray abnormal report.

## 2015-10-02 NOTE — Telephone Encounter (Signed)
Misa call for Dr Neva SeatGreene at [facility] call to request an order for the pt to have an xray and Bone Density . Pt fell and is having breast and rib pain. Dr Kirtland BouchardK said it was ok to put the order in.

## 2015-10-02 NOTE — Telephone Encounter (Signed)
Tara MaxwellCheryl, [facility]. Pt has been scheduled.

## 2015-10-03 ENCOUNTER — Other Ambulatory Visit: Payer: Self-pay | Admitting: Internal Medicine

## 2015-10-03 DIAGNOSIS — R911 Solitary pulmonary nodule: Secondary | ICD-10-CM

## 2015-10-03 DIAGNOSIS — R9389 Abnormal findings on diagnostic imaging of other specified body structures: Secondary | ICD-10-CM

## 2015-10-03 NOTE — Telephone Encounter (Signed)
Done, see result note 

## 2015-10-03 NOTE — Telephone Encounter (Signed)
Please schedule chest CT without contrast

## 2015-10-06 ENCOUNTER — Ambulatory Visit (INDEPENDENT_AMBULATORY_CARE_PROVIDER_SITE_OTHER)
Admission: RE | Admit: 2015-10-06 | Discharge: 2015-10-06 | Disposition: A | Discharge status: Home / Self Care | Payer: Medicare Other | Source: Ambulatory Visit | Attending: Internal Medicine | Admitting: Internal Medicine

## 2015-10-06 DIAGNOSIS — R938 Abnormal findings on diagnostic imaging of other specified body structures: Secondary | ICD-10-CM

## 2015-10-06 DIAGNOSIS — R911 Solitary pulmonary nodule: Secondary | ICD-10-CM

## 2015-10-06 DIAGNOSIS — R9389 Abnormal findings on diagnostic imaging of other specified body structures: Secondary | ICD-10-CM

## 2015-10-07 ENCOUNTER — Telehealth: Payer: Self-pay

## 2015-10-07 NOTE — Telephone Encounter (Signed)
Patient aware of xray results.   

## 2015-10-07 NOTE — Telephone Encounter (Signed)
-----   Message from Gordy SaversPeter F Kwiatkowski, MD sent at 10/06/2015  5:38 PM EDT ----- Notify patient that chest x-ray revealed significant emphysema but no lung mass

## 2015-10-28 ENCOUNTER — Telehealth: Payer: Self-pay | Admitting: Internal Medicine

## 2015-10-28 NOTE — Telephone Encounter (Signed)
Tara Cruz, please tell pt we have not received anything from Ortho.

## 2015-10-28 NOTE — Telephone Encounter (Signed)
Pt is calling to see if we received and fax medical clearance [facility]ok [facility]

## 2015-10-29 NOTE — Telephone Encounter (Signed)
I called and talk with sherri scheduling coordinator and she said dr Debbe Balesbrook give his pt medical clearance form to take to PCP. Pt is aware and will contact [facility]

## 2015-10-30 NOTE — Telephone Encounter (Signed)
Pt has been sch

## 2015-10-31 ENCOUNTER — Ambulatory Visit (INDEPENDENT_AMBULATORY_CARE_PROVIDER_SITE_OTHER): Payer: Medicare Other | Admitting: Internal Medicine

## 2015-10-31 ENCOUNTER — Ambulatory Visit: Payer: Self-pay | Admitting: Physician Assistant

## 2015-10-31 ENCOUNTER — Encounter: Payer: Self-pay | Admitting: Internal Medicine

## 2015-10-31 VITALS — BP 120/76 | HR 87 | Temp 98.3°F | Resp 20 | Ht 65.75 in | Wt 156.0 lb

## 2015-10-31 DIAGNOSIS — M4712 Other spondylosis with myelopathy, cervical region: Secondary | ICD-10-CM

## 2015-10-31 DIAGNOSIS — M549 Dorsalgia, unspecified: Secondary | ICD-10-CM | POA: Diagnosis not present

## 2015-10-31 DIAGNOSIS — I2510 Atherosclerotic heart disease of native coronary artery without angina pectoris: Secondary | ICD-10-CM

## 2015-10-31 DIAGNOSIS — J4520 Mild intermittent asthma, uncomplicated: Secondary | ICD-10-CM

## 2015-10-31 DIAGNOSIS — I10 Essential (primary) hypertension: Secondary | ICD-10-CM | POA: Diagnosis not present

## 2015-10-31 DIAGNOSIS — F17200 Nicotine dependence, unspecified, uncomplicated: Secondary | ICD-10-CM

## 2015-10-31 DIAGNOSIS — Z720 Tobacco use: Secondary | ICD-10-CM

## 2015-10-31 DIAGNOSIS — E785 Hyperlipidemia, unspecified: Secondary | ICD-10-CM

## 2015-10-31 MED ORDER — ZOLPIDEM TARTRATE 5 MG PO TABS: 5.0000 mg | ORAL_TABLET | Freq: Every evening | ORAL | Status: DC | PRN

## 2015-10-31 NOTE — Progress Notes (Signed)
Pre visit review using our clinic review tool, if applicable. No additional management support is needed unless otherwise documented below in the visit note. 

## 2015-10-31 NOTE — Progress Notes (Signed)
Subjective:    Patient ID: Tara Cruz, female    DOB: Sep 16, 1950, 65 y.o.   MRN: [id number]  HPI  65 year old patient who is seen today for preoperative clearance. The patient had a recent fall with some chest wall contusion.  A chest x-ray was ordered that was concerning for a possible lung nodule.  A chest CT scan was then performed that revealed no evidence of a pulmonary nodule but revealed a significant cervical cord compression.  The patient has been seen by Dr. Shon Baton and is scheduled for surgery in 6 days. The patient has developed progressive arm and especially lower extremity weakness including a left foot drop.  She has been forced to use a walker for the past 2 months and has had multiple falls. She has coronary artery disease with a history of a 95% stenosis of a second diagonal branch. The patient has been treated medically and has had no recent chest pain.  Prior to her myelopathy.  She was walking 2 miles per day without difficulty.  She has had stable DOE but no exertional chest pain.  She continues to smoke about 1 half pack per day.  She has discontinued all her medications including aspirin and simvastatin.  Chest CT also revealed coronary artery calcification as well as extensive emphysematous changes.  Past Medical History  Diagnosis Date  . Allergy   . Hyperlipidemia   . Asthma   . Hypertension   . Right bundle branch block   . Abnormal stress test 01/03/2013    false positive by Dr. Allyson Sabal  . CAD (coronary artery disease) 01/30/2013    single vessel disease of small second diagonal branch, normal EF 65%  . Tobacco use     has tried to stop numerous times  . H/O cardiovascular stress test 2014  . Arthritis     degenerative lumbar spine   . COPD (chronic obstructive pulmonary disease) (HCC)     reviewed by Dr. Sherene Sires     Social History   Social History  . Marital Status: Significant Other    Spouse Name: N/A  . Number of Children: N/A  . Years of  Education: N/A   Occupational History  . Retired [employer]   Social History Main Topics  . Smoking status: Current Every Day Smoker -- 0.25 packs/day for 50 years    Types: Cigarettes  . Smokeless tobacco: Never Used  . Alcohol Use: Yes     Comment: wine- on weekends   . Drug Use: No  . Sexual Activity: Yes   Other Topics Concern  . Not on file   Social History Narrative    Past Surgical History  Procedure Laterality Date  . Abdominal hysterectomy    . Foot surgery      for plantar fascitis   . Cardiac catheterization  01/30/2013    2nd small diag branch with 95% stentosis, too small for intervention.  No disease in other vessels.  . Left heart catheterization with coronary angiogram N/A 01/30/2013    Procedure: LEFT HEART CATHETERIZATION WITH CORONARY ANGIOGRAM;  Surgeon: Runell Gess, MD;  Location: [facility] CATH LAB;  Service: Cardiovascular;  Laterality: N/A;  . Hand surgery Left   . Eye surgery      cataracts removed, /w IOL- bilateral, yag laser  . Tubal ligation      Family History  Problem Relation Age of Onset  . Coronary artery disease Mother   . Alzheimer's disease Mother   .  Coronary artery disease Father   . Breast cancer Sister   . Rheum arthritis Mother   . Asthma Sister     No Known Allergies  Current Outpatient Prescriptions on File Prior to Visit  Medication Sig Dispense Refill  . aspirin 81 MG tablet Take 81 mg by mouth daily.      . Calcium Carbonate-Vitamin D (CALCIUM + D PO) Take 1 tablet by mouth daily.    . celecoxib (CELEBREX) 200 MG capsule Take 1 capsule (200 mg total) by mouth daily. 90 capsule 3  . cetirizine (ZYRTEC) 10 MG tablet Take 10 mg by mouth daily.     Marland Kitchen. estradiol (ESTRACE) 0.5 MG tablet Take 1 tablet (0.5 mg total) by mouth daily. 90 tablet 3  . simvastatin (ZOCOR) 40 MG tablet Take 1 tablet (40 mg total) by mouth at bedtime. 90 tablet 3  . terbinafine (LAMISIL) 250 MG tablet Take 1 tablet (250 mg total) by mouth daily.  84 tablet 0  . [DISCONTINUED] DETROL LA 2 MG 24 hr capsule TAKE 1 CAPSULE DAILY 90 capsule 2   No current facility-administered medications on file prior to visit.    BP 120/76 mmHg  Pulse 87  Temp(Src) 98.3 F (36.8 C) (Oral)  Resp 20  Ht 5' 5.75" (1.67 m)  Wt 156 lb (70.761 kg)  BMI 25.37 kg/m2  SpO2 96%     Review of Systems  HENT: Negative for congestion, dental problem, hearing loss, rhinorrhea, sinus pressure, sore throat and tinnitus.   Eyes: Negative for pain, discharge and visual disturbance.  Respiratory: Negative for cough and shortness of breath.   Cardiovascular: Negative for chest pain, palpitations and leg swelling.  Gastrointestinal: Negative for nausea, vomiting, abdominal pain, diarrhea, constipation, blood in stool and abdominal distention.  Genitourinary: Negative for dysuria, urgency, frequency, hematuria, flank pain, vaginal bleeding, vaginal discharge, difficulty urinating, vaginal pain and pelvic pain.  Musculoskeletal: Positive for back pain and gait problem. Negative for joint swelling and arthralgias.  Skin: Negative for rash.  Neurological: Positive for weakness. Negative for dizziness, syncope, speech difficulty, numbness and headaches.  Hematological: Negative for adenopathy.  Psychiatric/Behavioral: Negative for behavioral problems, dysphoric mood and agitation. The patient is not nervous/anxious.        Objective:   Physical Exam  Constitutional: She is oriented to person, place, and time. She appears well-developed and well-nourished.  Blood pressure well controlled  HENT:  Head: Normocephalic.  Right Ear: External ear normal.  Left Ear: External ear normal.  Mouth/Throat: Oropharynx is clear and moist.  Eyes: Conjunctivae and EOM are normal. Pupils are equal, round, and reactive to light.  Neck: Normal range of motion. Neck supple. No thyromegaly present.  Faint left supraclavicular bruit  Cardiovascular: Normal rate, regular rhythm,  normal heart sounds and intact distal pulses.   Pedal pulses intact, except for an absent left  Posterior tibial pulse  Pulmonary/Chest: Effort normal and breath sounds normal.  Abdominal: Soft. Bowel sounds are normal. She exhibits no mass. There is no tenderness.  Musculoskeletal: Normal range of motion. She exhibits edema.  Slight edema left ankle  Lymphadenopathy:    She has no cervical adenopathy.  Neurological: She is alert and oriented to person, place, and time.  Lower extremity weakness with left foot drop Unsteady gait, uses a walker  Skin: Skin is warm and dry. No rash noted.  Psychiatric: She has a normal mood and affect. Her behavior is normal.          Assessment &  Plan:   , coronary artery disease.  Patient has a history of single-vessel disease ( 95% stenosis of a second diagonal branch).  History of positive stress test.  Patient has been asked to resume simvastatin.  Will hold aspirin until postop , hypertension, well-controlled .  Cervical myelopathy.  Urgent decompression as scheduled  Rogelia Boga, MD

## 2015-10-31 NOTE — Patient Instructions (Signed)
Limit your sodium (Salt) intake  Please check your blood pressure on a regular basis.  If it is consistently greater than 150/90, please make an office appointment.  Smoking tobacco is very bad for your health. You should stop smoking immediately.  Resume simvastatin

## 2015-11-04 NOTE — Pre-Procedure Instructions (Signed)
Tara Cruz  11/04/2015      [facility] - [place], [place] - [address] [place] [address] Phone: [phone] Fax: [phone]  [facility] - [place], [place] - [address] [place] [address] Phone: [phone] Fax: [phone]  [facility] - [place], [place] - [address] [place] [address] [place] [address] Phone: [phone] Fax: [phone]    Your procedure is scheduled on    Thursday  11/06/15  Report to [facility] Admitting at 530 A.M.  Call this number if you have problems the morning of surgery:  [phone]   Remember:  Do not eat food or drink liquids after midnight.  Take these medicines the morning of surgery with A SIP OF WATER    Albuterol , estradiol (estrace), advair, tramadol (ultram) if needed   (STOP ASPIRIN, CELECOXIB (CELEBREX), GLUCOSAMINE-MSM-COMPLEX, MULTIVITAMIN, HERBAL MEDICINES, IBUPROFEN/ ADVIL/ MOTRIN, GOODY POWDERS/ BC'S)     Do not wear jewelry, make-up or nail polish.  Do not wear lotions, powders, or perfumes.  You may wear deodorant.  Do not shave 48 hours prior to surgery.  Men may shave face and neck.  Do not bring valuables to the hospital.  [facility] is not responsible for any belongings or valuables.  Contacts, dentures or bridgework may not be worn into surgery.  Leave your suitcase in the car.  After surgery it may be brought to your room.  For patients admitted to the hospital, discharge time will be determined by your treatment team.  Patients discharged the day of surgery will not be allowed to drive home.   Name and phone number of your driver:   Special instructions:  [name] - Preparing for Surgery  Before surgery, you can play an important role.  Because skin is not sterile, your skin needs to be as free of germs as possible.  You can reduce the number of germs  on you skin by washing with CHG (chlorahexidine gluconate) soap before surgery.  CHG is an antiseptic cleaner which kills germs and bonds with the skin to continue killing germs even after washing.  Please DO NOT use if you have an allergy to CHG or antibacterial soaps.  If your skin becomes reddened/irritated stop using the CHG and inform your nurse when you arrive at Short Stay.  Do not shave (including legs and underarms) for at least 48 hours prior to the first CHG shower.  You may shave your face.  Please follow these instructions carefully:   1.  Shower with CHG Soap the night before surgery and the                                morning of Surgery.  2.  If you choose to wash your hair, wash your hair first as usual with your       normal shampoo.  3.  After you shampoo, rinse your hair and body thoroughly to remove the                      Shampoo.  4.  Use CHG as you would any other liquid soap.  You can apply chg directly       to the skin and wash gently with scrungie or a clean washcloth.  5.  Apply the CHG Soap  to your body ONLY FROM THE NECK DOWN.        Do not use on open wounds or open sores.  Avoid contact with your eyes,       ears, mouth and genitals (private parts).  Wash genitals (private parts)       with your normal soap.  6.  Wash thoroughly, paying special attention to the area where your surgery        will be performed.  7.  Thoroughly rinse your body with warm water from the neck down.  8.  DO NOT shower/wash with your normal soap after using and rinsing off       the CHG Soap.  9.  Pat yourself dry with a clean towel.            10.  Wear clean pajamas.            11.  Place clean sheets on your bed the night of your first shower and do not        sleep with pets.  Day of Surgery  Do not apply any lotions/deoderants the morning of surgery.  Please wear clean clothes to the hospital/surgery center.    Please read over the following fact sheets that you were  given. Pain Booklet, MRSA Information and Surgical Site Infection Prevention

## 2015-11-05 ENCOUNTER — Encounter: Payer: Self-pay | Admitting: Diagnostic Neuroimaging

## 2015-11-05 ENCOUNTER — Encounter (HOSPITAL_COMMUNITY): Payer: Self-pay

## 2015-11-05 ENCOUNTER — Encounter (HOSPITAL_COMMUNITY)
Admission: RE | Admit: 2015-11-05 | Discharge: 2015-11-05 | Disposition: A | Discharge status: Home / Self Care | Payer: Medicare Other | Source: Ambulatory Visit | Attending: Orthopedic Surgery | Admitting: Orthopedic Surgery

## 2015-11-05 ENCOUNTER — Ambulatory Visit (INDEPENDENT_AMBULATORY_CARE_PROVIDER_SITE_OTHER): Payer: Medicare Other | Admitting: Diagnostic Neuroimaging

## 2015-11-05 VITALS — BP 116/65 | HR 75 | Ht 66.0 in | Wt 153.0 lb

## 2015-11-05 DIAGNOSIS — I2510 Atherosclerotic heart disease of native coronary artery without angina pectoris: Secondary | ICD-10-CM

## 2015-11-05 DIAGNOSIS — R29898 Other symptoms and signs involving the musculoskeletal system: Secondary | ICD-10-CM | POA: Diagnosis not present

## 2015-11-05 DIAGNOSIS — M4712 Other spondylosis with myelopathy, cervical region: Secondary | ICD-10-CM

## 2015-11-05 DIAGNOSIS — M545 Low back pain: Secondary | ICD-10-CM | POA: Diagnosis not present

## 2015-11-05 DIAGNOSIS — G8929 Other chronic pain: Secondary | ICD-10-CM | POA: Diagnosis not present

## 2015-11-05 DIAGNOSIS — G609 Hereditary and idiopathic neuropathy, unspecified: Secondary | ICD-10-CM

## 2015-11-05 HISTORY — DX: Major depressive disorder, single episode, unspecified: F32.9

## 2015-11-05 HISTORY — DX: Emphysema, unspecified: J43.9

## 2015-11-05 HISTORY — DX: Polyneuropathy, unspecified: G62.9

## 2015-11-05 HISTORY — DX: Depression, unspecified: F32.A

## 2015-11-05 LAB — BASIC METABOLIC PANEL
Anion gap: 8 (ref 5–15)
BUN: 21 mg/dL — ABNORMAL HIGH (ref 6–20)
CHLORIDE: 106 mmol/L (ref 101–111)
CO2: 27 mmol/L (ref 22–32)
CREATININE: 0.87 mg/dL (ref 0.44–1.00)
Calcium: 9.8 mg/dL (ref 8.9–10.3)
GFR calc non Af Amer: >60 mL/min (ref >60)
Glucose, Bld: 121 mg/dL — ABNORMAL HIGH (ref 65–99)
POTASSIUM: 3.7 mmol/L (ref 3.5–5.1)
Sodium: 141 mmol/L (ref 135–145)

## 2015-11-05 LAB — SURGICAL PCR SCREEN
MRSA, PCR: NEGATIVE
STAPHYLOCOCCUS AUREUS: NEGATIVE

## 2015-11-05 LAB — CBC
HEMATOCRIT: 42.2 % (ref 36.0–46.0)
HEMOGLOBIN: 13.6 g/dL (ref 12.0–15.0)
MCH: 30.2 pg (ref 26.0–34.0)
MCHC: 32.2 g/dL (ref 30.0–36.0)
MCV: 93.8 fL (ref 78.0–100.0)
Platelets: 244 10*3/uL (ref 150–400)
RBC: 4.5 MIL/uL (ref 3.87–5.11)
RDW: 13.6 % (ref 11.5–15.5)
WBC: 6.2 10*3/uL (ref 4.0–10.5)

## 2015-11-05 NOTE — Progress Notes (Signed)
[facility]  PATIENT: Tara Cruz DOB: February 27, 1951  REFERRING CLINICIAN: D Brooks HISTORY FROM: patient  REASON FOR VISIT: new consult    HISTORICAL  CHIEF COMPLAINT:  Chief Complaint  Patient presents with  . Neuropathy, foot drop, leg weakness    rm 6, New Pt, "began Feb 2017, Cervical Neck fusion surgery tomorrow"    HISTORY OF PRESENT ILLNESS:   65 year old female here for evaluation of lower extremity weakness, gait and balance difficulty, foot drop.  August 2016 patient had low back pain radiating to right leg. Patient underwent decompression surgery at that time. Symptoms slightly improved. February 2017 patient developed increasing weakness in left foot dorsiflexion leading to a "drop foot". Patient was also feeling low back pain and cramping in the left lower back region. In April 2017 patient ended up falling down onto her back. Patient thinks she fell down due to her right foot weakness. Following the fall in April 2017 patient had progressive lower extremity weakness and gait difficulty. In the past 1 month patient has also had right hand weakness, tingling and numbness in both hands.  Patient had EMG/NCS by Dr. 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 Therefore neurology evaluation was recommended. The next day, patient was evaluated by Dr. Shon Baton on 10/21/15 who then immediately suspected cervical myelopathy, and ordered stat cervical MRI to rule out cord compression. Patient requested to see Dr. Pearlean Brownie neurology, and therefore Dr. Shon Baton placed referral. The next day patient had MRI of the cervical spine which confirmed cord compression. Patient now set up for cervical decompression surgery tomorrow.   REVIEW OF SYSTEMS: Full 14 system review of systems performed and negative with exception of: Depression anxiety not asleep decreased energy weakness restless legs easy bruising feeling  hot 10 pound weight loss swelling in legs ringing in ears aching muscles incontinence.  ALLERGIES: No Known Allergies  HOME MEDICATIONS: Outpatient Prescriptions Prior to Visit  Medication Sig Dispense Refill  . acetaminophen (TYLENOL ARTHRITIS PAIN) 650 MG CR tablet Take 650 mg by mouth every 8 (eight) hours as needed for pain.    Marland Kitchen albuterol (PROVENTIL HFA;VENTOLIN HFA) 108 (90 Base) MCG/ACT inhaler Inhale 1 puff into the lungs every 4 (four) hours as needed for wheezing or shortness of breath.    Marland Kitchen aspirin EC 81 MG tablet Take 81 mg by mouth daily.    . Calcium Carb-Cholecalciferol (CALCIUM + D3) 600-800 MG-UNIT TABS Take 1 tablet by mouth daily.    . celecoxib (CELEBREX) 200 MG capsule Take 200 mg by mouth daily.    Marland Kitchen estradiol (ESTRACE) 0.5 MG tablet Take 0.5 mg by mouth daily.    . Fluticasone-Salmeterol (ADVAIR) 250-50 MCG/DOSE AEPB Inhale 1 puff into the lungs 2 (two) times daily as needed (For shortness of breath.).    Marland Kitchen Glucos-MSM-C-Mn-Ginger-Willow (GLUCOSAMINE MSM COMPLEX PO) Take 1 tablet by mouth daily.     . Multiple Vitamin (MULTIVITAMIN WITH MINERALS) TABS tablet Take 1 tablet by mouth daily.    . simvastatin (ZOCOR) 40 MG tablet Take 1 tablet (40 mg total) by mouth at bedtime. 90 tablet 3  . traMADol (ULTRAM) 50 MG tablet Take 50 mg by mouth every 6 (six) hours as needed (For pain.).     Marland Kitchen zolpidem (AMBIEN) 5 MG tablet Take 1 tablet (5 mg total) by mouth at bedtime as needed for sleep. 90 tablet 1   No facility-administered medications prior to visit.    PAST MEDICAL HISTORY: Past  Medical History  Diagnosis Date  . Allergy   . Hyperlipidemia   . Asthma   . Hypertension   . Right bundle branch block   . Abnormal stress test 01/03/2013    false positive by Dr. Allyson Sabal  . CAD (coronary artery disease) 01/30/2013    single vessel disease of small second diagonal branch, normal EF 65%  . Tobacco use     has tried to stop numerous times  . H/O cardiovascular stress test  2014  . Arthritis     degenerative lumbar spine   . COPD (chronic obstructive pulmonary disease) (HCC)     reviewed by Dr. Sherene Sires    PAST SURGICAL HISTORY: Past Surgical History  Procedure Laterality Date  . Abdominal hysterectomy  1978  . Foot surgery      for plantar fascitis   . Cardiac catheterization  01/30/2013    2nd small diag branch with 95% stentosis, too small for intervention.  No disease in other vessels.  . Left heart catheterization with coronary angiogram N/A 01/30/2013    Procedure: LEFT HEART CATHETERIZATION WITH CORONARY ANGIOGRAM;  Surgeon: Runell Gess, MD;  Location: [facility] CATH LAB;  Service: Cardiovascular;  Laterality: N/A;  . Hand surgery Left     2015  . Eye surgery      cataracts removed, /w IOL- bilateral, yag laser  . Tubal ligation      FAMILY HISTORY: Family History  Problem Relation Age of Onset  . Coronary artery disease Mother   . Alzheimer's disease Mother   . Rheum arthritis Mother   . Arthritis Mother   . Coronary artery disease Father   . Breast cancer Sister   . Asthma Sister   . Coronary artery disease Sister   . Hypertension Sister   . Arthritis Sister   . Coronary artery disease Maternal Grandfather   . Arthritis Paternal Grandmother   . Coronary artery disease Paternal Grandfather   . Breast cancer Cousin     SOCIAL HISTORY:  Social History   Social History  . Marital Status: Significant Other    Spouse Name: N/A  . Number of Children: 2  . Years of Education: 13   Occupational History  . Retired [employer]    retired   Social History Main Topics  . Smoking status: Current Every Day Smoker -- 0.25 packs/day for 50 years    Types: Cigarettes  . Smokeless tobacco: Never Used  . Alcohol Use: Yes     Comment: wine- on weekends   . Drug Use: No  . Sexual Activity: Yes   Other Topics Concern  . Not on file   Social History Narrative   Lives with fiancee   Caffeine use- coffee on weekend, 1 cup      PHYSICAL EXAM  GENERAL EXAM/CONSTITUTIONAL: Vitals:  Filed Vitals:   11/05/15 0931  BP: 116/65  Pulse: 75  Height: 5\' 6"  (1.676 m)  Weight: 153 lb (69.4 kg)     Body mass index is 24.71 kg/(m^2).  Visual Acuity Screening   Right eye Left eye Both eyes  Without correction: 20/30 20/30   With correction:        Patient is in no distress; well developed, nourished and groomed; neck is supple  CARDIOVASCULAR:  Examination of carotid arteries is normal; no carotid bruits  Regular rate and rhythm, no murmurs  Examination of peripheral vascular system by observation and palpation is normal  EYES:  Ophthalmoscopic exam of optic discs and  posterior segments is normal; no papilledema or hemorrhages  MUSCULOSKELETAL:  Gait, strength, tone, movements noted in Neurologic exam below  NEUROLOGIC: MENTAL STATUS:  No flowsheet data found.  awake, alert, oriented to person, place and time  recent and remote memory intact  normal attention and concentration  language fluent, comprehension intact, naming intact,   fund of knowledge appropriate  CRANIAL NERVE:   2nd - no papilledema on fundoscopic exam  2nd, 3rd, 4th, 6th - pupils equal and reactive to light, visual fields full to confrontation, extraocular muscles intact, no nystagmus  5th - facial sensation symmetric  7th - facial strength symmetric  8th - hearing intact  9th - palate elevates symmetrically, uvula midline  11th - shoulder shrug symmetric  12th - tongue protrusion midline  MOTOR:   normal bulk and tone, full strength in the BUE, BLE; EXCEPT BILATERAL FINGER ABDUCTION 3, LEFT LEG: HIP FLEX 3-4, KE/KF 4, DF 2-3, INVERSION 3, EVERSION 3, PF 4  SENSORY:   normal and symmetric to light touch, temperature, vibration; DECR PP IN FINGERS AND TOES/FEET  COORDINATION:   finger-nose-finger, fine finger movements normal  REFLEXES:   deep tendon reflexes --> BUE 3, KNEES 3+ (WITH PATHOLOGIC  SPREAD), ANKLES 0; DOWN GOING TOES; BORDERLINE HOFFMAN'S IN HANDS  GAIT/STATION:   narrow based gait; SLOW AND CAUTIOUS GAIT; LEFT FOOT DROP GAIT; USES WALKER    DIAGNOSTIC DATA (LABS, IMAGING, TESTING) - I reviewed patient records, labs, notes, testing and imaging myself where available.  Lab Results  Component Value Date   WBC 5.3 08/22/2015   HGB 14.0 08/22/2015   HCT 41.9 08/22/2015   MCV 93.2 08/22/2015   PLT 273.0 08/22/2015      Component Value Date/Time   NA 141 08/22/2015 1207   K 4.3 08/22/2015 1207   CL 107 08/22/2015 1207   CO2 29 08/22/2015 1207   GLUCOSE 78 08/22/2015 1207   BUN 30* 08/22/2015 1207   CREATININE 0.97 08/22/2015 1207   CREATININE 1.02 01/23/2013 0802   CALCIUM 9.9 08/22/2015 1207   PROT 6.6 08/22/2015 1207   ALBUMIN 3.8 08/22/2015 1207   AST 27 08/22/2015 1207   ALT 24 08/22/2015 1207   ALKPHOS 59 08/22/2015 1207   BILITOT 0.5 08/22/2015 1207   GFRNONAA 55* 01/09/2015 1021   GFRAA >60 01/09/2015 1021   Lab Results  Component Value Date   CHOL 163 01/31/2015   HDL 46.80 01/31/2015   LDLCALC 95 01/31/2015   TRIG 108.0 01/31/2015   CHOLHDL 3 01/31/2015   No results found for: HGBA1C No results found for: VITAMINB12 Lab Results  Component Value Date   TSH 0.88 01/31/2015     11/08/14 MRI lumbar spine [report only] - At L3-4 facet arthropathy, anterolisthesis, ligamentum flavum hypertrophy, synovial cyst, resulting in severe central canal stenosis - At L4-5 moderate central canal and mild to moderate right foraminal stenosis - At L5-S1 small central disc protrusion without mass effect on S1 nerve roots  09/18/15 MRI lumbar spine [report only] - Postsurgical changes of decompression and interbody fusion L3-4; mild left and mild-to-moderate right foraminal stenosis - At L4-5 mild central canal stenosis and mild bilateral foraminal stenosis - At L5-S1 small central disc protrusion which abuts the bilateral S1 roots without significant  nerve root displacement  10/21/15 EMG/NCS [Dr. 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 - No evidence of lumbosacral radiculopathy based on EMG; lumbar paraspinal muscles were normal  10/22/15 MRI cervical spine [facility]ie [facility][report  only] - Multilevel degenerative disc disease - At C6-7 large right paracentral disc extrusion with marked mass effect on cord and causes severe canal stenosis with edema/myelomalacia; mild to moderate bilateral foraminal stenosis - At C5-6 disc osteophyte complex with mild mass effect on ventral hemicord, severe right foraminal stenosis, moderate left for multiple stenosis - At C2-3 moderate right and mild left foraminal stenosis - At C3-4 mild to moderate left and mild right foraminal stenosis - At C4-5 moderate right frontal stenosis - At C7-T1 moderate left foraminal stenosis     ASSESSMENT AND PLAN  65 y.o. year old female here with history of severe lumbar spinal stenosis in June 2016, status post decompression, with subsequent left lower extremity weakness in February 2017, with subsequent fall in April 2017. Suspect patient had developed left lumbar radiculopathy in February 2017, leading to gait difficulty and subsequent fall. When patient fell in April 2017 she may have damaged/injured due to acute disc herniation at that time. Patient now getting definitive treatment with cervical decompression surgery tomorrow. It'll remain to be seen in patients recovery and course of symptoms in the future.  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 We will check neuropathy labs at this time for further evaluation. After patient cervical spine surgery recovery may consider further workup to differentiate focal neuropathy versus widespread polyneuropathy.  Dx:  1. Left leg weakness   2. Spondylosis, cervical, with myelopathy   3.  Chronic bilateral low back pain without sciatica   4. Hereditary and idiopathic peripheral neuropathy     PLAN: - check neuropathy labs now - follow up in 2 months (after cervical spine surgery and recovery); then may consider further workup to differentiate L5 radiculopathy vs peroneal neuropathy vs multifocal or demyelinating neuropathy  Orders Placed This Encounter  Procedures  . Neuropathy Panel   Return in about 2 months (around 01/05/2016).    Suanne Marker, MD 11/05/2015, 10:44 AM Certified in Neurology, Neurophysiology and Neuroimaging  [facility] [address], [address] [place], [place] [address] [phone]

## 2015-11-05 NOTE — Progress Notes (Signed)
Anesthesia Chart Review:  Pt is a 65 year old female scheduled for C4-7 ACDF on 11/06/2015 with Dr. Shon BatonBrooks.   PCP is Dr. Eleonore ChiquitoPeter Kwiatkowski who is aware of upcoming surgery.   PMH includes:  CAD, HTN, hyperlipidemia, RBBB, asthma, COPD, emphysema. Current smoker. BMI 25. S/p L3-4 decompression and posterior spinal fusion 01/15/15.   Medications include: albuterol, ASA, advair, simvastatin  BP was 112/50 at PAT on no BP meds  Preoperative labs reviewed.    CT chest 10/06/15:  1. No lung masses. The density on chest x-ray represents a prominent slightly irregular pericardial fat pad. 2. Extensive emphysema. 3. Bronchitic changes. 4. Prominent calcification at the C6-7 level severely compresses the spinal cord asymmetric to the left. 5. Aortic atherosclerosis and coronary artery atherosclerosis.  EKG 01/09/15: NSR with sinus arrhythmia. Left axis deviation. RBBB  Cardiac cath 01/30/13: 1. Left main; normal  2. LAD; the epicardial LAD was free of significant disease. The second small diagonal branch had a 95% stenosis though this was too small to intervene on 3. Left circumflex; nondominant and free of significant disease.  4. Right coronary artery; dominant and free of significant disease 5. Left ventriculography; RAO left ventriculogram was performed using 25 mL of Visipaque dye at 12 mL/second. The overall LVEF estimated 65 % Without wall motion abnormalities IMPRESSION:Ms. Mechele Collinlliott has essentially normal coronary arteries with a small diagonal branch ostial stenosis. I do not think that her chest pain necessarily is ischemic and I suspect that her Myoview stress test was false positive. Continued medical therapy will be recommended.  Nuclear stress test 01/03/13: Low to intermediate risk stress nuclear study with a fixed basal to mid inferior perfusion defect but a reversible apical anteroseptal perfusion defect. The fixed defect probably represents soft tissue attenuation given normal wall  motion, but cannot rule out ischemia with the apical anteroseptal reversible defect, though it is small. LV Ejection Fraction: 58%. LV Wall Motion: NL LV Function; NL Wall Motion  Carotid duplex 07/03/12: 0-39% B ICA stenosis  Pt tolerated lumbar fusion 12/2014 without issue. If no changes, I anticipate pt can proceed with surgery as scheduled.   Rica Mastngela Jodene Polyak, FNP-BC [facility] Phone: [phone] 11/05/2015 3:21 PM

## 2015-11-05 NOTE — Patient Instructions (Addendum)
[facility]. I hope we have been able to provide you high quality care today.  You may receive a patient satisfaction survey over the next few weeks. We would appreciate your feedback and comments so that we may continue to improve ourselves and the health of our patients.  - I will check labs  - return after neck surgery and rehab/recovery

## 2015-11-05 NOTE — Progress Notes (Signed)
ANGELA KABBE WILL EVALUATE PATIENT'S CHART.

## 2015-11-06 ENCOUNTER — Inpatient Hospital Stay (HOSPITAL_COMMUNITY): Payer: Medicare Other | Admitting: Anesthesiology

## 2015-11-06 ENCOUNTER — Inpatient Hospital Stay (HOSPITAL_COMMUNITY)
Admission: RE | Admit: 2015-11-06 | Discharge: 2015-11-08 | DRG: 472 | Disposition: A | Discharge status: Home / Self Care | Payer: Medicare Other | Source: Ambulatory Visit | Attending: Orthopedic Surgery | Admitting: Orthopedic Surgery

## 2015-11-06 ENCOUNTER — Observation Stay (HOSPITAL_COMMUNITY): Payer: Medicare Other

## 2015-11-06 ENCOUNTER — Inpatient Hospital Stay (HOSPITAL_COMMUNITY): Payer: Medicare Other | Admitting: Emergency Medicine

## 2015-11-06 ENCOUNTER — Inpatient Hospital Stay (HOSPITAL_COMMUNITY): Payer: Medicare Other

## 2015-11-06 ENCOUNTER — Encounter (HOSPITAL_COMMUNITY)
Admission: RE | Disposition: A | Discharge status: Home / Self Care | Payer: Self-pay | Source: Ambulatory Visit | Attending: Orthopedic Surgery

## 2015-11-06 ENCOUNTER — Encounter (HOSPITAL_COMMUNITY): Payer: Self-pay | Admitting: *Deleted

## 2015-11-06 DIAGNOSIS — I10 Essential (primary) hypertension: Secondary | ICD-10-CM | POA: Diagnosis present

## 2015-11-06 DIAGNOSIS — I2510 Atherosclerotic heart disease of native coronary artery without angina pectoris: Secondary | ICD-10-CM | POA: Diagnosis present

## 2015-11-06 DIAGNOSIS — Z981 Arthrodesis status: Secondary | ICD-10-CM

## 2015-11-06 DIAGNOSIS — G6289 Other specified polyneuropathies: Secondary | ICD-10-CM | POA: Diagnosis present

## 2015-11-06 DIAGNOSIS — Z419 Encounter for procedure for purposes other than remedying health state, unspecified: Secondary | ICD-10-CM

## 2015-11-06 DIAGNOSIS — G9731 Intraoperative hemorrhage and hematoma of a nervous system organ or structure complicating a nervous system procedure: Secondary | ICD-10-CM | POA: Diagnosis not present

## 2015-11-06 DIAGNOSIS — Z7951 Long term (current) use of inhaled steroids: Secondary | ICD-10-CM

## 2015-11-06 DIAGNOSIS — M4712 Other spondylosis with myelopathy, cervical region: Secondary | ICD-10-CM | POA: Diagnosis present

## 2015-11-06 DIAGNOSIS — I4510 Unspecified right bundle-branch block: Secondary | ICD-10-CM | POA: Diagnosis present

## 2015-11-06 DIAGNOSIS — M50021 Cervical disc disorder at C4-C5 level with myelopathy: Secondary | ICD-10-CM | POA: Diagnosis present

## 2015-11-06 DIAGNOSIS — J449 Chronic obstructive pulmonary disease, unspecified: Secondary | ICD-10-CM | POA: Diagnosis present

## 2015-11-06 DIAGNOSIS — M542 Cervicalgia: Secondary | ICD-10-CM | POA: Diagnosis present

## 2015-11-06 DIAGNOSIS — E785 Hyperlipidemia, unspecified: Secondary | ICD-10-CM | POA: Diagnosis present

## 2015-11-06 DIAGNOSIS — M62830 Muscle spasm of back: Secondary | ICD-10-CM | POA: Diagnosis present

## 2015-11-06 DIAGNOSIS — M2578 Osteophyte, vertebrae: Secondary | ICD-10-CM | POA: Diagnosis present

## 2015-11-06 DIAGNOSIS — Z79891 Long term (current) use of opiate analgesic: Secondary | ICD-10-CM

## 2015-11-06 DIAGNOSIS — Z87891 Personal history of nicotine dependence: Secondary | ICD-10-CM

## 2015-11-06 DIAGNOSIS — M21372 Foot drop, left foot: Secondary | ICD-10-CM | POA: Diagnosis present

## 2015-11-06 HISTORY — PX: ANTERIOR CERVICAL DECOMP/DISCECTOMY FUSION: SHX1161

## 2015-11-06 SURGERY — ANTERIOR CERVICAL DECOMPRESSION/DISCECTOMY FUSION 3 LEVELS
Anesthesia: General

## 2015-11-06 MED ORDER — FENTANYL CITRATE (PF) 100 MCG/2ML IJ SOLN
INTRAMUSCULAR | Status: AC
  Administered 2015-11-06: 25 ug via INTRAVENOUS
  Filled 2015-11-06: qty 2

## 2015-11-06 MED ORDER — PHENYLEPHRINE HCL 10 MG/ML IJ SOLN
INTRAMUSCULAR | Status: DC | PRN
  Administered 2015-11-06: 40 ug via INTRAVENOUS

## 2015-11-06 MED ORDER — ACETAMINOPHEN 10 MG/ML IV SOLN
INTRAVENOUS | Status: DC | PRN
  Administered 2015-11-06: 1000 mg via INTRAVENOUS

## 2015-11-06 MED ORDER — PHENYLEPHRINE HCL 10 MG/ML IJ SOLN: 10.0000 mg | INTRAVENOUS | Status: DC | PRN

## 2015-11-06 MED ORDER — METHOCARBAMOL 1000 MG/10ML IJ SOLN
500.0000 mg | Freq: Four times a day (QID) | INTRAMUSCULAR | Status: DC | PRN
  Administered 2015-11-06: 500 mg via INTRAVENOUS
  Filled 2015-11-06 (×3): qty 5

## 2015-11-06 MED ORDER — ACETAMINOPHEN 10 MG/ML IV SOLN
INTRAVENOUS | Status: AC
  Filled 2015-11-06: qty 100

## 2015-11-06 MED ORDER — DEXAMETHASONE SODIUM PHOSPHATE 10 MG/ML IJ SOLN
INTRAMUSCULAR | Status: DC | PRN
  Administered 2015-11-06: 10 mg via INTRAVENOUS

## 2015-11-06 MED ORDER — SODIUM CHLORIDE 0.9% FLUSH: 3.0000 mL | INTRAVENOUS | Status: DC | PRN

## 2015-11-06 MED ORDER — CEFAZOLIN SODIUM 1-5 GM-% IV SOLN
1.0000 g | Freq: Three times a day (TID) | INTRAVENOUS | Status: AC
Start: 2015-11-06 — End: 2015-11-07
  Administered 2015-11-06 – 2015-11-07 (×2): 1 g via INTRAVENOUS
  Filled 2015-11-06 (×2): qty 50

## 2015-11-06 MED ORDER — SODIUM CHLORIDE 0.9% FLUSH
3.0000 mL | Freq: Two times a day (BID) | INTRAVENOUS | Status: DC
  Administered 2015-11-06: 3 mL via INTRAVENOUS

## 2015-11-06 MED ORDER — SODIUM CHLORIDE 0.9 % IV SOLN
0.0125 ug/kg/min | INTRAVENOUS | Status: DC
  Filled 2015-11-06: qty 2000

## 2015-11-06 MED ORDER — MIDAZOLAM HCL 5 MG/5ML IJ SOLN
INTRAMUSCULAR | Status: DC | PRN
  Administered 2015-11-06: 2 mg via INTRAVENOUS

## 2015-11-06 MED ORDER — OXYCODONE HCL 5 MG PO TABS: 5.0000 mg | ORAL_TABLET | Freq: Once | ORAL | Status: DC | PRN

## 2015-11-06 MED ORDER — FENTANYL CITRATE (PF) 100 MCG/2ML IJ SOLN
INTRAMUSCULAR | Status: DC | PRN
  Administered 2015-11-06: 100 ug via INTRAVENOUS
  Administered 2015-11-06: 50 ug via INTRAVENOUS
  Administered 2015-11-06: 100 ug via INTRAVENOUS

## 2015-11-06 MED ORDER — LACTATED RINGERS IV SOLN: INTRAVENOUS | Status: DC

## 2015-11-06 MED ORDER — OXYCODONE-ACETAMINOPHEN 10-325 MG PO TABS: 1.0000 | ORAL_TABLET | ORAL | Status: DC | PRN

## 2015-11-06 MED ORDER — DOCUSATE SODIUM 100 MG PO CAPS: 100.0000 mg | ORAL_CAPSULE | Freq: Three times a day (TID) | ORAL | Status: DC | PRN

## 2015-11-06 MED ORDER — OXYCODONE HCL 5 MG/5ML PO SOLN: 5.0000 mg | Freq: Once | ORAL | Status: DC | PRN

## 2015-11-06 MED ORDER — LIDOCAINE HCL (CARDIAC) 20 MG/ML IV SOLN
INTRAVENOUS | Status: DC | PRN
  Administered 2015-11-06: 60 mg via INTRAVENOUS

## 2015-11-06 MED ORDER — ONDANSETRON HCL 4 MG/2ML IJ SOLN: 4.0000 mg | Freq: Once | INTRAMUSCULAR | Status: DC | PRN

## 2015-11-06 MED ORDER — THROMBIN 20000 UNITS EX SOLR
CUTANEOUS | Status: DC | PRN
  Administered 2015-11-06: 20000 [IU] via TOPICAL

## 2015-11-06 MED ORDER — DEXAMETHASONE SODIUM PHOSPHATE 4 MG/ML IJ SOLN: 4.0000 mg | Freq: Four times a day (QID) | INTRAMUSCULAR | Status: AC

## 2015-11-06 MED ORDER — DEXTROSE 5 % IV SOLN: 10.0000 mg | INTRAVENOUS | Status: DC | PRN

## 2015-11-06 MED ORDER — THROMBIN 20000 UNITS EX SOLR
CUTANEOUS | Status: AC
  Filled 2015-11-06: qty 20000

## 2015-11-06 MED ORDER — FENTANYL CITRATE (PF) 250 MCG/5ML IJ SOLN
INTRAMUSCULAR | Status: AC
Start: 2015-11-06 — End: 2015-11-06
  Filled 2015-11-06: qty 5

## 2015-11-06 MED ORDER — PROPOFOL 500 MG/50ML IV EMUL
INTRAVENOUS | Status: DC | PRN
  Administered 2015-11-06: 11:00:00 via INTRAVENOUS
  Administered 2015-11-06: 50 ug/kg/min via INTRAVENOUS

## 2015-11-06 MED ORDER — PHENYLEPHRINE HCL 10 MG/ML IJ SOLN
10.0000 mg | INTRAVENOUS | Status: DC | PRN
  Administered 2015-11-06: 15 ug/min via INTRAVENOUS

## 2015-11-06 MED ORDER — ONDANSETRON HCL 4 MG/2ML IJ SOLN: 4.0000 mg | INTRAMUSCULAR | Status: DC | PRN

## 2015-11-06 MED ORDER — SODIUM CHLORIDE 0.9 % IV SOLN
0.0125 ug/kg/min | INTRAVENOUS | Status: AC
  Administered 2015-11-06: 12:00:00 via INTRAVENOUS
  Administered 2015-11-06: .1 ug/kg/min via INTRAVENOUS
  Filled 2015-11-06: qty 2000

## 2015-11-06 MED ORDER — BUPIVACAINE-EPINEPHRINE 0.25% -1:200000 IJ SOLN
INTRAMUSCULAR | Status: DC | PRN
  Administered 2015-11-06: 10 mL

## 2015-11-06 MED ORDER — MENTHOL 3 MG MT LOZG: 1.0000 | LOZENGE | OROMUCOSAL | Status: DC | PRN

## 2015-11-06 MED ORDER — CEFAZOLIN SODIUM-DEXTROSE 2-4 GM/100ML-% IV SOLN
2.0000 g | INTRAVENOUS | Status: AC
  Administered 2015-11-06 (×2): 2 g via INTRAVENOUS

## 2015-11-06 MED ORDER — LACTATED RINGERS IV SOLN
INTRAVENOUS | Status: DC | PRN
  Administered 2015-11-06 (×3): via INTRAVENOUS

## 2015-11-06 MED ORDER — THROMBIN 20000 UNITS EX SOLR
CUTANEOUS | Status: DC | PRN
  Administered 2015-11-06: 20 mL via TOPICAL

## 2015-11-06 MED ORDER — OXYCODONE HCL 5 MG PO TABS
10.0000 mg | ORAL_TABLET | ORAL | Status: DC | PRN
  Administered 2015-11-06 – 2015-11-08 (×7): 10 mg via ORAL
  Filled 2015-11-06 (×7): qty 2

## 2015-11-06 MED ORDER — DEXAMETHASONE SODIUM PHOSPHATE 10 MG/ML IJ SOLN
INTRAMUSCULAR | Status: AC
  Filled 2015-11-06: qty 1

## 2015-11-06 MED ORDER — DEXMEDETOMIDINE BOLUS VIA INFUSION
0.7000 ug/kg | Freq: Once | INTRAVENOUS | Status: AC
  Administered 2015-11-06: 48.65 ug via INTRAVENOUS
  Filled 2015-11-06: qty 49

## 2015-11-06 MED ORDER — ALBUTEROL SULFATE (2.5 MG/3ML) 0.083% IN NEBU: 3.0000 mL | INHALATION_SOLUTION | RESPIRATORY_TRACT | Status: DC | PRN

## 2015-11-06 MED ORDER — METHOCARBAMOL 500 MG PO TABS: 500.0000 mg | ORAL_TABLET | Freq: Three times a day (TID) | ORAL | Status: DC | PRN

## 2015-11-06 MED ORDER — BUPIVACAINE-EPINEPHRINE (PF) 0.25% -1:200000 IJ SOLN
INTRAMUSCULAR | Status: AC
  Filled 2015-11-06: qty 30

## 2015-11-06 MED ORDER — 0.9 % SODIUM CHLORIDE (POUR BTL) OPTIME
TOPICAL | Status: DC | PRN
  Administered 2015-11-06: 1000 mL

## 2015-11-06 MED ORDER — MOMETASONE FURO-FORMOTEROL FUM 200-5 MCG/ACT IN AERO
2.0000 | INHALATION_SPRAY | Freq: Two times a day (BID) | RESPIRATORY_TRACT | Status: DC
  Administered 2015-11-06 – 2015-11-07 (×2): 2 via RESPIRATORY_TRACT
  Filled 2015-11-06: qty 8.8

## 2015-11-06 MED ORDER — DEXAMETHASONE 4 MG PO TABS
4.0000 mg | ORAL_TABLET | Freq: Four times a day (QID) | ORAL | Status: AC
  Administered 2015-11-06 – 2015-11-07 (×3): 4 mg via ORAL
  Filled 2015-11-06 (×3): qty 1

## 2015-11-06 MED ORDER — FENTANYL CITRATE (PF) 100 MCG/2ML IJ SOLN
INTRAMUSCULAR | Status: AC
  Administered 2015-11-06: 50 ug via INTRAVENOUS
  Filled 2015-11-06: qty 2

## 2015-11-06 MED ORDER — MORPHINE SULFATE (PF) 2 MG/ML IV SOLN: 1.0000 mg | INTRAVENOUS | Status: DC | PRN

## 2015-11-06 MED ORDER — FENTANYL CITRATE (PF) 100 MCG/2ML IJ SOLN
25.0000 ug | INTRAMUSCULAR | Status: DC | PRN
  Administered 2015-11-06: 25 ug via INTRAVENOUS
  Administered 2015-11-06 (×2): 50 ug via INTRAVENOUS
  Administered 2015-11-06: 25 ug via INTRAVENOUS

## 2015-11-06 MED ORDER — HEMOSTATIC AGENTS (NO CHARGE) OPTIME
TOPICAL | Status: DC | PRN
  Administered 2015-11-06: 1 via TOPICAL

## 2015-11-06 MED ORDER — CEFAZOLIN SODIUM-DEXTROSE 2-4 GM/100ML-% IV SOLN
INTRAVENOUS | Status: AC
  Filled 2015-11-06: qty 100

## 2015-11-06 MED ORDER — SUCCINYLCHOLINE CHLORIDE 20 MG/ML IJ SOLN
INTRAMUSCULAR | Status: DC | PRN
  Administered 2015-11-06: 140 mg via INTRAVENOUS

## 2015-11-06 MED ORDER — MIDAZOLAM HCL 2 MG/2ML IJ SOLN
INTRAMUSCULAR | Status: AC
  Filled 2015-11-06: qty 2

## 2015-11-06 MED ORDER — METHOCARBAMOL 500 MG PO TABS
500.0000 mg | ORAL_TABLET | Freq: Four times a day (QID) | ORAL | Status: DC | PRN
  Administered 2015-11-06 – 2015-11-07 (×4): 500 mg via ORAL
  Filled 2015-11-06 (×6): qty 1

## 2015-11-06 MED ORDER — PROPOFOL 10 MG/ML IV BOLUS
INTRAVENOUS | Status: DC | PRN
Start: 2015-11-06 — End: 2015-11-06
  Administered 2015-11-06: 150 mg via INTRAVENOUS
  Administered 2015-11-06 (×2): 50 mg via INTRAVENOUS

## 2015-11-06 MED ORDER — PHENOL 1.4 % MT LIQD
1.0000 | OROMUCOSAL | Status: DC | PRN
  Administered 2015-11-07: 1 via OROMUCOSAL
  Filled 2015-11-06: qty 177

## 2015-11-06 MED ORDER — PROPOFOL 10 MG/ML IV BOLUS
INTRAVENOUS | Status: AC
  Filled 2015-11-06: qty 20

## 2015-11-06 MED ORDER — ZOLPIDEM TARTRATE 5 MG PO TABS
5.0000 mg | ORAL_TABLET | Freq: Once | ORAL | Status: AC
  Administered 2015-11-06: 5 mg via ORAL
  Filled 2015-11-06: qty 1

## 2015-11-06 SURGICAL SUPPLY — 92 items
12MM CASSETTE HEAD ×1 IMPLANT
APL SRG 60D 8 XTD TIP BNDBL (TIP) ×2
BLADE SURG ROTATE 9660 (MISCELLANEOUS) IMPLANT
BUR EGG ELITE 4.0 (BURR) IMPLANT
BUR EGG ELITE 4.0MM (BURR)
BUR MATCHSTICK NEURO 3.0 LAGG (BURR) ×2 IMPLANT
CAGE LORDOTIC 6 SM (Cage) ×1 IMPLANT
CANISTER SUCTION 2500CC (MISCELLANEOUS) ×3 IMPLANT
CLIP SWIFT PLUS FIXATION 1MM (Clip) IMPLANT
CLOSURE STERI-STRIP 1/2X4 (GAUZE/BANDAGES/DRESSINGS) ×1
CLOSURE WOUND 1/2 X4 (GAUZE/BANDAGES/DRESSINGS) ×1
CLSR STERI-STRIP ANTIMIC 1/2X4 (GAUZE/BANDAGES/DRESSINGS) ×2 IMPLANT
CORDS BIPOLAR (ELECTRODE) ×3 IMPLANT
COVER SURGICAL LIGHT HANDLE (MISCELLANEOUS) ×6 IMPLANT
CRADLE DONUT ADULT HEAD (MISCELLANEOUS) ×3 IMPLANT
DRAIN TLS ROUND 10FR (DRAIN) ×2 IMPLANT
DRAPE C-ARM 42X72 X-RAY (DRAPES) ×3 IMPLANT
DRAPE POUCH INSTRU U-SHP 10X18 (DRAPES) ×3 IMPLANT
DRAPE PROXIMA HALF (DRAPES) ×2 IMPLANT
DRAPE SURG 17X23 STRL (DRAPES) ×3 IMPLANT
DRAPE U-SHAPE 47X51 STRL (DRAPES) ×3 IMPLANT
DRILL BIT SWIFT PLUS 12MM (BIT) ×2 IMPLANT
DRSG MEPILEX BORDER 4X4 (GAUZE/BANDAGES/DRESSINGS) ×2 IMPLANT
DRSG MEPILEX BORDER 4X8 (GAUZE/BANDAGES/DRESSINGS) ×3 IMPLANT
DURAPREP 6ML APPLICATOR 50/CS (WOUND CARE) ×3 IMPLANT
DURASEAL APPLICATOR TIP (TIP) ×4 IMPLANT
DURASEAL SPINE SEALANT 3ML (MISCELLANEOUS) ×2 IMPLANT
ELECT COATED BLADE 2.86 ST (ELECTRODE) ×3 IMPLANT
ELECT PENCIL ROCKER SW 15FT (MISCELLANEOUS) ×3 IMPLANT
ELECT REM PT RETURN 9FT ADLT (ELECTROSURGICAL) ×3
ELECTRODE REM PT RTRN 9FT ADLT (ELECTROSURGICAL) ×1 IMPLANT
FEE INTRAOP MONITOR IMPULS NCS (MISCELLANEOUS) IMPLANT
GAUZE SPONGE 4X4 12PLY STRL (GAUZE/BANDAGES/DRESSINGS) ×2 IMPLANT
GLOVE BIO SURGEON STRL SZ 6.5 (GLOVE) ×3 IMPLANT
GLOVE BIO SURGEONS STRL SZ 6.5 (GLOVE) ×2
GLOVE BIOGEL PI IND STRL 6.5 (GLOVE) ×1 IMPLANT
GLOVE BIOGEL PI IND STRL 8 (GLOVE) IMPLANT
GLOVE BIOGEL PI IND STRL 8.5 (GLOVE) ×1 IMPLANT
GLOVE BIOGEL PI INDICATOR 6.5 (GLOVE) ×6
GLOVE BIOGEL PI INDICATOR 8 (GLOVE) ×4
GLOVE BIOGEL PI INDICATOR 8.5 (GLOVE) ×2
GLOVE SS BIOGEL STRL SZ 8.5 (GLOVE) ×1 IMPLANT
GLOVE SUPERSENSE BIOGEL SZ 8.5 (GLOVE) ×6
GLOVE SURG SS PI 6.5 STRL IVOR (GLOVE) ×4 IMPLANT
GOWN STRL REUS W/ TWL XL LVL3 (GOWN DISPOSABLE) ×2 IMPLANT
GOWN STRL REUS W/TWL 2XL LVL3 (GOWN DISPOSABLE) ×10 IMPLANT
GOWN STRL REUS W/TWL XL LVL3 (GOWN DISPOSABLE) ×9
GRAFT DURAGEN MATRIX 1WX1L (Tissue) ×2 IMPLANT
INTRAOP MONITOR FEE IMPULS NCS (MISCELLANEOUS) ×1
INTRAOP MONITOR FEE IMPULSE (MISCELLANEOUS) ×2
KIT BASIN OR (CUSTOM PROCEDURE TRAY) ×3 IMPLANT
KIT ROOM TURNOVER OR (KITS) ×3 IMPLANT
NDL SPNL 18GX3.5 QUINCKE PK (NEEDLE) ×1 IMPLANT
NEEDLE SPNL 18GX3.5 QUINCKE PK (NEEDLE) ×3 IMPLANT
NS IRRIG 1000ML POUR BTL (IV SOLUTION) ×3 IMPLANT
PACK ORTHO CERVICAL (CUSTOM PROCEDURE TRAY) ×3 IMPLANT
PACK UNIVERSAL I (CUSTOM PROCEDURE TRAY) ×3 IMPLANT
PAD ARMBOARD 7.5X6 YLW CONV (MISCELLANEOUS) ×9 IMPLANT
PATTIES SURGICAL .25X.25 (GAUZE/BANDAGES/DRESSINGS) ×3 IMPLANT
PATTIES SURGICAL .5 X.5 (GAUZE/BANDAGES/DRESSINGS) ×4 IMPLANT
PIN DISTRACTION 14 (PIN) ×4 IMPLANT
PLATE SWIFT 3LVL 48MM (Plate) ×2 IMPLANT
PUTTY DBX 1CC (Putty) ×3 IMPLANT
PUTTY DBX 1CC DEPUY (Putty) IMPLANT
RESTRAINT LIMB HOLDER UNIV (RESTRAINTS) ×3 IMPLANT
SCREW LOCKING VBR OMNI (Screw) ×1 IMPLANT
SCREW RESCUE SD-VA SWIFT 14MM (Screw) ×4 IMPLANT
SCREW SD-VA 14M SWIFT PLUS (Screw) ×4 IMPLANT
SCREW SWIFT SD-VA 12MM (Screw) ×4 IMPLANT
SPACER SM YBR 12MMX16-25X6 (Spacer) ×2 IMPLANT
SPONGE INTESTINAL PEANUT (DISPOSABLE) ×6 IMPLANT
SPONGE LAP 4X18 X RAY DECT (DISPOSABLE) ×2 IMPLANT
SPONGE SURGIFOAM ABS GEL 100 (HEMOSTASIS) ×3 IMPLANT
STRIP CLOSURE SKIN 1/2X4 (GAUZE/BANDAGES/DRESSINGS) ×1 IMPLANT
SURGIFLO W/THROMBIN 8M KIT (HEMOSTASIS) ×2 IMPLANT
SUT BONE WAX W31G (SUTURE) ×3 IMPLANT
SUT MNCRL AB 3-0 PS2 18 (SUTURE) ×2 IMPLANT
SUT MON AB 3-0 SH 27 (SUTURE) ×3
SUT MON AB 3-0 SH27 (SUTURE) ×1 IMPLANT
SUT SILK 2 0 (SUTURE) ×3
SUT SILK 2-0 18XBRD TIE 12 (SUTURE) ×1 IMPLANT
SUT VIC AB 2-0 CT1 18 (SUTURE) ×5 IMPLANT
SWIFT CLIP PLUS FIXATION 1MM (Clip) ×9 IMPLANT
SYR BULB IRRIGATION 50ML (SYRINGE) ×3 IMPLANT
SYR CONTROL 10ML LL (SYRINGE) ×3 IMPLANT
TAPE CLOTH 4X10 WHT NS (GAUZE/BANDAGES/DRESSINGS) ×3 IMPLANT
TAPE UMBILICAL COTTON 1/8X30 (MISCELLANEOUS) ×3 IMPLANT
TOWEL OR 17X24 6PK STRL BLUE (TOWEL DISPOSABLE) ×3 IMPLANT
TOWEL OR 17X26 10 PK STRL BLUE (TOWEL DISPOSABLE) ×3 IMPLANT
TRAY FOLEY CATH 16FRSI W/METER (SET/KITS/TRAYS/PACK) ×3 IMPLANT
WATER STERILE IRR 1000ML POUR (IV SOLUTION) ×1 IMPLANT
YANKAUER SUCT BULB TIP NO VENT (SUCTIONS) ×2 IMPLANT

## 2015-11-06 NOTE — Brief Op Note (Signed)
11/06/2015  1:46 PM  PATIENT:  Tara BottcherPatricia A Cruz  65 y.o. female  PRE-OPERATIVE DIAGNOSIS:  CERVICAL MYELOPATHY   POST-OPERATIVE DIAGNOSIS:  CERVICAL MYELOPATHY  PROCEDURE:  Procedure(s): ANTERIOR CERVICAL DISCECTOMY  C4-5, C6 CORPECTOMY, FUSION C4-7 (N/A)  SURGEON:  Surgeon(s) and Role:    * Venita Lickahari Rasmus Preusser, MD - Primary  PHYSICIAN ASSISTANT:   ASSISTANTS: carmen mao   ANESTHESIA:   general  EBL:  Total I/O In: 2000 [I.V.:2000] Out: 1275 [Urine:1100; Blood:175]  BLOOD ADMINISTERED:none  DRAINS: (1 ) Jackson-Pratt drain(s) with closed bulb suction in the neck   LOCAL MEDICATIONS USED:  MARCAINE     SPECIMEN:  No Specimen  DISPOSITION OF SPECIMEN:  N/A  COUNTS:  YES  TOURNIQUET:  * No tourniquets in log *  DICTATION: .Other Dictation: Dictation Number [id number]  PLAN OF CARE: Admit to inpatient   PATIENT DISPOSITION:  PACU - hemodynamically stable.

## 2015-11-06 NOTE — Discharge Instructions (Signed)

## 2015-11-06 NOTE — Anesthesia Procedure Notes (Signed)
Procedure Name: Intubation Date/Time: 11/06/2015 7:53 AM Performed by: Arlice ColtMANESS, Spero Gunnels B Pre-anesthesia Checklist: Patient identified, Emergency Drugs available, Suction available, Patient being monitored and Timeout performed Patient Re-evaluated:Patient Re-evaluated prior to inductionOxygen Delivery Method: Circle system utilized Preoxygenation: Pre-oxygenation with 100% oxygen Intubation Type: IV induction and Rapid sequence Laryngoscope Size: Glidescope (elective glidescope intubation) Grade View: Grade I Tube type: Oral Tube size: 7.0 mm Number of attempts: 1 Airway Equipment and Method: Stylet Placement Confirmation: ETT inserted through vocal cords under direct vision,  positive ETCO2 and breath sounds checked- equal and bilateral Secured at: 21 cm Tube secured with: Tape Dental Injury: Teeth and Oropharynx as per pre-operative assessment

## 2015-11-06 NOTE — Anesthesia Preprocedure Evaluation (Addendum)
Anesthesia Evaluation  Patient identified by MRN, date of birth, ID band Patient awake    Reviewed: Allergy & Precautions, NPO status , Patient's Chart, lab work & pertinent test results  Airway Mallampati: II  TM Distance: >3 FB Neck ROM: Full    Dental  (+) Teeth Intact, Dental Advisory Given   Pulmonary Current Smoker,    breath sounds clear to auscultation       Cardiovascular hypertension,  Rhythm:Regular Rate:Normal     Neuro/Psych    GI/Hepatic   Endo/Other    Renal/GU      Musculoskeletal   Abdominal   Peds  Hematology   Anesthesia Other Findings Weakness and hyperreflexia in LEs  Reproductive/Obstetrics                            Anesthesia Physical Anesthesia Plan  ASA: III  Anesthesia Plan: General   Post-op Pain Management:    Induction: Intravenous  Airway Management Planned: Oral ETT  Additional Equipment:   Intra-op Plan:   Post-operative Plan: Extubation in OR  Informed Consent: I have reviewed the patients History and Physical, chart, labs and discussed the procedure including the risks, benefits and alternatives for the proposed anesthesia with the patient or authorized representative who has indicated his/her understanding and acceptance.     Plan Discussed with: CRNA and Anesthesiologist  Anesthesia Plan Comments:         Anesthesia Quick Evaluation

## 2015-11-06 NOTE — H&P (Signed)
History of Present Illness The patient is a 65 year old female who presents for a Follow-up for Follow-up Neck. The patient is being followed for their spasms (and lower extremity weakness). Symptoms reported today include: weakness. The patient feels that they are doing poorly.  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)  Acute midline low back pain without sciatica (M54.5)  Contusion of fifth finger of left hand, initial encounter (G40.102V(S60.052A)  Sprain of finger of left hand, initial encounter (O53.664Q(S63.619A)  Encounter for care following Lumbar Fusion (Z47.89)  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 Tobacco use  Former smoker. 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 Pain Contract  no  Medication History Voltaren (1% Gel, 1 (one) Gram(s) Transdermal as directed, Taken starting 07/13/2013) Active. (use as directed) Robaxin  (500MG  Tablet, 1 (one) Tablet Oral 1 po tid, Taken starting 01/31/2015) Active. (rx sent to [facility] [address] per ddb/smt 01/31/15) Oxycodone-Acetaminophen (10-325MG  Tablet, Oral) Active. (prn) Ondansetron HCl (4MG  Tablet, Oral) Active. (prn) Terbinafine HCl (250MG  Tablet, Oral) Active. Simvastatin (40MG  Tablet, Oral) Active. Fluticasone Propionate (50MCG/ACT Suspension, Nasal) Active. (prn) Xopenex HFA (45MCG/ACT Aerosol, Inhalation) Active. (prn) CeleBREX (200MG  Capsule, Oral) Active. (bid) Vytorin (10-20MG  Tablet, Oral) Active. (qd) Estradiol (0.5MG  Tablet, Oral) Active. (qd) Medications Reconciled  Other Problems  Asthma  Osteoarthritis   Objective Transcription General General Appearance-Not in acute distress. Orientation-Oriented X3. Build & Nutrition-Well nourished and Well developed.  Integumentary General Characteristics Surgical Scars - surgical scarring consistent with previous lumbar surgery, no surgical scar evidence of previous cervical surgery. Cervical Spine-Skin examination of the cervical spine is without deformity, skin lesions, lacerations or abrasions.  Peripheral Vascular Upper Extremity Palpation - Radial pulse - Bilateral - 2+.  Neurologic Sensation Upper Extremity - Bilateral - sensation is intact in the upper extremity. Reflexes Biceps Reflex - Bilateral - 2+. Brachioradialis Reflex - Bilateral - 2+. Triceps Reflex - Bilateral - 2+. Hoffman's Sign - Bilateral - Hoffman's sign present. Note: positive Babinski   Musculoskeletal Spine/Ribs/Pelvis  Cervical Spine : Inspection and Palpation - Tenderness - right cervical paraspinals tender to palpation and left cervical paraspinals tender to palpation. Strength and Tone: Strength - Deltoid - Bilateral - 5/5. Biceps - Bilateral - 5/5. Triceps - Bilateral - 4-/5. Wrist Extension - Bilateral - 5/5. Hand Grip - Bilateral - 5/5. Heel walk - Bilateral - unable to heel walk. Toe Walk -  Bilateral - unable to walk on toes. Heel-Toe Walk - Bilateral - able to heel-toe walk with moderate difficulty. Note:  with use of walker. ROM - Flexion - Moderately Decreased and painful. Extension - Moderately Decreased and painful. Left Lateral Flexion - Moderately Decreased and painful. Right Lateral Flexion - Moderately Decreased and painful. Left Rotation - Moderately Decreased and painful. Right Rotation - Moderately Decreased and painful. Pain - . Cervical Spine - Special Testing - axial compression test negative, cross chest impingement test negative. Non-Anatomic Signs - No non-anatomic signs present. Upper Extremity Range of Motion - No truesholder pain with IR/ER of the shoulders.   She continues to have difficulty ambulating, managing her balance. She has a positive Hoffmann sign. She has unequivocal Babinski. She is also having increasing right neuropathic C6-C7 pain. No significant weakness, but her coordination is adversely affected. She has no shortness of breath or chest pain at present.  Current Plans Goal Of Surgery: Discussed that goal of surgery is to reduce pain and improve function and quality of life. Patient is aware that despite all appropriate treatment that there pain and function could be the same, worse, or different.  Anterior cervical fusion:Risks of surgery include, but are not limited to: Throat pain, swallowing difficulty, hoarseness or change in voice, death, stroke, paralysis, nerve root damage/injury, bleeding, blood clots, loss of bowel/bladder control, hardware failure, or mal-position, spinal fluid leak, adjacent segment disease, non-union, need for further surgery, ongoing or worse pain, infection. Post-operative bleeding or swelling that could require emergent surgery.  Note:Plan on 3 level ACDF Will use external bone stimulator post-op given number of levels  At this point in time, we have reviewed her MRI from 10/22/2015. She has multilevel degenerative  disc disease, most noted at C4-5, C5-6 and C6-7. There is also a focus of T2 signal change within the canal at the C6-7 level with a large posterior central and slightly to the left hard disc osteophyte causing severe canal stenosis. At C5-6, there is a symmetrical to the right hard disc osteophyte causing marked compression of the exiting 6 nerve root. At C4-5, there is a slight anterolisthesis and significant facet arthrosis.  At this point in time, the patient really has three-level pathology. The most concerning is the area of spinal cord compression at C6-7. My concern with addressing the 5-6 and 6-7 level, at the 4-5 level will become a pain source. There is slight anterolisthesis and degenerative disc disease at that level with severe bilateral facet arthrosis. Therefore, I would recommend addressing all three levels. I have explained the risks to her, which include infection, bleeding, nerve damage, death, stroke, paralysis, throat pain, swallowing difficulties, hoarseness in the voice, need for further surgery whether it is anterior or posterior. Since this is myelomalacia, we will use intraoperative spinal cord monitoring. She is expressing understanding of the risks. We will get preoperative medical clearance from her primary care physician and we will move forward in a timely fashion.

## 2015-11-06 NOTE — Transfer of Care (Signed)
Immediate Anesthesia Transfer of Care Note  Patient: Tara Bottcheratricia A Mendibles  Procedure(s) Performed: Procedure(s): ANTERIOR CERVICAL DISCECTOMY  C4-5, C6 CORPECTOMY, FUSION C4-7 (N/A)  Patient Location: PACU  Anesthesia Type:General  Level of Consciousness: awake, alert  and oriented  Airway & Oxygen Therapy: Patient Spontanous Breathing  Post-op Assessment: Report given to RN and Post -op Vital signs reviewed and stable  Post vital signs: Reviewed and stable  Last Vitals:  Filed Vitals:   11/06/15 0605  BP: 137/73  Pulse: 71  Temp: 36.7 C  Resp: 20    Last Pain: There were no vitals filed for this visit.       Complications: No apparent anesthesia complications

## 2015-11-07 ENCOUNTER — Encounter (HOSPITAL_COMMUNITY): Payer: Self-pay | Admitting: Orthopedic Surgery

## 2015-11-07 LAB — CBC WITH DIFFERENTIAL/PLATELET
BASOS PCT: 0 %
Basophils Absolute: 0 10*3/uL (ref 0.0–0.1)
EOS ABS: 0 10*3/uL (ref 0.0–0.7)
EOS PCT: 0 %
HCT: 43.4 % (ref 36.0–46.0)
HEMOGLOBIN: 14 g/dL (ref 12.0–15.0)
LYMPHS ABS: 1.3 10*3/uL (ref 0.7–4.0)
Lymphocytes Relative: 10 %
MCH: 30.3 pg (ref 26.0–34.0)
MCHC: 32.3 g/dL (ref 30.0–36.0)
MCV: 93.9 fL (ref 78.0–100.0)
MONO ABS: 1.9 10*3/uL — AB (ref 0.1–1.0)
MONOS PCT: 14 %
NEUTROS PCT: 76 %
Neutro Abs: 9.7 10*3/uL — ABNORMAL HIGH (ref 1.7–7.7)
Platelets: 271 10*3/uL (ref 150–400)
RBC: 4.62 MIL/uL (ref 3.87–5.11)
RDW: 13.9 % (ref 11.5–15.5)
WBC: 12.9 10*3/uL — ABNORMAL HIGH (ref 4.0–10.5)

## 2015-11-07 LAB — NEUROPATHY PANEL
A/G Ratio: 1.6 (ref 0.7–1.7)
ANA: NEGATIVE
ANGIO CONVERT ENZYME: 32 U/L (ref 14–82)
Albumin ELP: 4.1 g/dL (ref 2.9–4.4)
Alpha 1: 0.2 g/dL (ref 0.0–0.4)
Alpha 2: 0.7 g/dL (ref 0.4–1.0)
Beta: 0.9 g/dL (ref 0.7–1.3)
GAMMA GLOBULIN: 0.8 g/dL (ref 0.4–1.8)
Globulin, Total: 2.5 g/dL (ref 2.2–3.9)
Sed Rate: 4 mm/hr (ref 0–40)
TSH: 0.989 u[IU]/mL (ref 0.450–4.500)
Total Protein: 6.6 g/dL (ref 6.0–8.5)
VIT D 25 HYDROXY: 39.1 ng/mL (ref 30.0–100.0)
Vitamin B-12: 664 pg/mL (ref 211–946)

## 2015-11-07 MED ORDER — ZOLPIDEM TARTRATE 5 MG PO TABS
5.0000 mg | ORAL_TABLET | Freq: Once | ORAL | Status: AC
  Administered 2015-11-07: 5 mg via ORAL
  Filled 2015-11-07: qty 1

## 2015-11-07 MED ORDER — SENNOSIDES-DOCUSATE SODIUM 8.6-50 MG PO TABS
1.0000 | ORAL_TABLET | Freq: Every day | ORAL | Status: DC
  Administered 2015-11-07: 1 via ORAL
  Filled 2015-11-07: qty 1

## 2015-11-07 NOTE — Op Note (Signed)
Tara Cruz, Tara Cruz            ACCOUNT NO.:  [id number]  MEDICAL RECORD NO.:  [id number]  LOCATION:                                 FACILITY:  PHYSICIAN:  Allanna Bresee D. Shon Baton, M.D. DATE OF BIRTH:  June 14, 1950  DATE OF PROCEDURE:  11/06/2015 DATE OF DISCHARGE:                              OPERATIVE REPORT   POSTOPERATIVE DIAGNOSIS:  Cervical spondylitic myelopathy, C4-5, C5-6, C6-7.  POSTOPERATIVE DIAGNOSIS:  Cervical spondylitic myelopathy, C4-5, C5-6, C6-7.  FIRST ASSISTANT:  [facility]  PLANNED PROCEDURE:  Three-level anterior cervical diskectomy and fusion, C4-7.  Intraoperatively, I elected to proceed with C6 corpectomy due to the severity of the spinal cord compression.  No actual CSF leak, but concern due to bleb and so placed a DuraGen patch along with DuraSeal.  Expandable Titan cage as well as a size 6 small Titan nanoLock cage at C4-5 using local bone from corpectomy as graph and DePuy translational plate.  INTRAOPERATIVE NEUROMONITORING:  At the start of the case, on lower extremity monitoring, motor were unreliable.  Upper extremity motors were intact and SSCPs were intact.  At the conclusion of the case, lower extremity motors were present signifying a significant improvement and status post the corpectomy and ACDF.  OPERATIVE NOTE:  The patient was brought to the operating room, placed supine on the operating table.  After successful induction of general anesthesia and endotracheal intubation, TEDs, SCDs, and a Foley were inserted.  The patient was placed supine on the operating room table and the neuromonitoring applied on all the intraoperative needles for monitoring.  The neck was then prepped and draped in a standard fashion. Time-out was taken to confirm patient, procedure, and all other pertinent important data.  Once this was completed, I identified the C4- 5 and the C6-7 disk space levels with x-ray.  I infiltrated the incision site with 0.25%  Marcaine.  I made a longitudinal incision and sharp dissection was carried out down to the platysma.  Platysma was sharply incised.  I continued the standard Smith-Robinson approach to the anterior cervical spine.  I continued dissecting sharply along the medial border of sternocleidomastoid sweeping the esophagus to the right and identified the omohyoid muscle.  Isolated and sacrificed for better visualization.  Once I was through the prevertebral fascia, I placed my thyroid retractor beneath the esophagus and retracted it, and then used Kittner dissectors to completely expose the 4-5, 5-6 and 6-7 disc spaces.  Once this done, a needle was placed into the 4-5 disk space and we confirmed the level. The disk was marked.  Using bipolar electrocautery, I mobilized the longus colli muscles from the midbody of C4 to the midbody of C7 bilaterally.  There were very large anterior exostosis, which were all removed with double-action Leksell rongeur. Once this was done, a self-retaining retractor was then placed into the wound underneath the longus colli muscle.  The endotracheal cuff was deflated and we expanded the retractor and then reinflated the endotracheal cuff.  At this point, I then performed an annulotomy at C6- 7 and used pituitary rongeurs to remove the bulk of the disc.  I then used a 2 mm Kerrison rongeur to trim down the inferior  osteophyte from the C6 and then placed distraction pins into the 6-7 disk space.  I gently distracted the space and continued to work posteriorly using curettes.  I then got down to the posterior longitudinal ligament and posterior annulus.  Upon dissection, there was significant compression of the thecal sac.  Despite multiple attempts, I could not get a nerve hook between the dura and the nerve in the large exostosis that was causing compression.  At this point, I elected to do a C6 corpectomy. This would give me the ability if nothing else to  completely decompressed the bone spur, so that even if it could be removed it would be no further ongoing compression of the thecal sac.  At this point, I identified the 5-6 disk space and performed a diskectomy in a similar fashion.  With the C6 vertebral body exposed using a Leksell rongeur, I performed a channel corpectomy of this level.  Once I had the bulk of the bone removed, I then used my Kerrison 1 mm and 2 mm Kerrison rongeur to began resecting the posterior cortical margin.  Distraction was removed.  I then began resecting the osteophyte.  It was at that time, I visualized the dura.  It was very thinned and there was a bleb.  I was quite concerned about massive dural tear given the sitting out of the thecal sac from this chronic compression and trying to get this bone spur out.  At this point, I was able to cut around the perimeter of the bone spur and it was grossly mobile.  However, I was quite concerned in trying to remove it that I would have a very large dural tear potential for spinal cord injury.  It was at this time that I elected not to continue removing the bone spur.  The thecal sac was adequately decompressed.  I could see it pulsatile in and there was no further compression to the bone spur and so I elected to proceed with the fusion portion.  I did place some DuraGen over the thinned area of dura just to give it some added support and some FloSeal.  Although, there was no frank leak and I was concerned because of the thinned out dura, I wanted to prevent this.  I then obtained a corpectomy cage, packed it with the autograft bone that I harvested from the corpectomy and placed it into the wound.  I expanded the cage, so I had good contact with the C5 and C7 implants.  At this point, I then shifted my attention to the 4-5 level.  The 4 distraction pins were placed into the 4 and 5 vertebral body and using the same technique, I performed a diskectomy at this level.   I was able to get some reduction in the anterior listhesis and I resected the posterior annulus.  I then undercut the uncovertebral joint with a #1 Kerrison rongeur for added neural decompression.  The endplates were rasped and I elected to place a size 6 small Titan nanoLock cage packed with local bone graft.  This was malleted to the final position, which was satisfactory.  The plate was then applied and secured with self- drilling 14 mm screws into the body of C4, 12 mm screws into the body of C5 and 14 mm rescue screws into the body of C7.  All screws had excellent purchase.  I irrigated the wound copiously with normal saline. I made sure I had hemostasis.  I then placed a deep  drain and then returned the trachea and esophagus to midline after removing all of the retractors.  I then closed the platysma with interrupted 2-0 Vicryl sutures and the skin with 3-0 Monocryl.  It was at this time, I connected the drain and we noted there was significant drainage.  At this point, I was concerned that had caused the bleed with my suture and so I re-exposed and removed the 3-0 Monocryl and then 2-0 Vicryl sutures and re-exposed the wound.  There were some superficial bleeding areas, which I coagulated and small bone bleeder that I also coagulated.  I again irrigated the wound copiously with normal saline and then closed platysma with 2-0 Vicryl sutures.  I then reattached the drain.  At this time, there was only about 5-10 mL of blood and there was no active bleeding.  I then closed the skin with 3-0 Monocryl and then used skin glue and Steri-Strips.  Bulky dry dressing was applied as was the Aspen collar.  She was extubated and transferred to PACU without incident.  At the end of the case, all needle and sponge counts were correct.    Koula Venier D. Shon Baton, M.D.   ______________________________ Donn Pierini. Shon Baton, M.D.   DDB/MEDQ  D:  11/06/2015  T:  11/07/2015  Job:  [id number]  cc:   Debria Garret  D. Shon Baton, M.D.

## 2015-11-07 NOTE — Evaluation (Signed)
Occupational Therapy Evaluation Patient Details Name: Tara Cruz MRN: [id number] DOB: [birth year] Today's Date: 11/07/2015    History of Present Illness Patient is a 65 yo female s/p ANTERIOR CERVICAL DISCECTOMY C4-5, C6 CORPECTOMY, FUSION C4-7/ PMH included CAD, HTN, hyperlipidemia, RBBB, asthma, COPD, emphysema. Current smoker. BMI 25. S/p L3-4 decompression and posterior spinal fusion 01/15/15    Clinical Impression   Patient evaluated by Occupational Therapy with no further acute OT needs identified. All education has been completed and the patient has no further questions. See below for any follow-up Occupational Therapy or equipment needs. OT to sign off. Thank you for referral.      Follow Up Recommendations  No OT follow up    Equipment Recommendations  3 in 1 bedside comode    Recommendations for Other Services       Precautions / Restrictions Precautions Precautions: Cervical Precaution Comments: reviewed handouts Required Braces or Orthoses: Cervical Brace Cervical Brace: Hard collar Restrictions Weight Bearing Restrictions: No      Mobility Bed Mobility Overal bed mobility: Modified Independent             General bed mobility comments: Not physical assist required, good technique  Transfers Overall transfer level: Needs assistance Equipment used: Rolling walker (2 wheeled) Transfers: Sit to/from Stand Sit to Stand: Supervision         General transfer comment: VCs for hand placement and safety with use of RW, cued to not abandon RW when going to sit down    Balance Overall balance assessment: History of Falls                                          ADL Overall ADL's : Needs assistance/impaired Eating/Feeding: Independent   Grooming: Wash/dry face;Supervision/safety   Upper Body Bathing: Set up   Lower Body Bathing: Set up         Lower Body Dressing Details (indicate cue type and reason): pt able to cross  bil LE Toilet Transfer: Supervision/safety             General ADL Comments: Pt able to cross bil Le and educated on bathing/ dressing.Pt will have fiance (A) upon d/c. Educated on home setup and grocery store purchase of indiviudal portions since patient will be home alone for ~8 hours at a time     Vision Vision Assessment?: No apparent visual deficits   Perception     Praxis      Pertinent Vitals/Pain Pain Assessment: Faces Pain Score: 4  Faces Pain Scale: Hurts little more Pain Location: neck Pain Descriptors / Indicators: Operative site guarding Pain Intervention(s): Monitored during session;Premedicated before session;Repositioned     Hand Dominance Right   Extremity/Trunk Assessment Upper Extremity Assessment Upper Extremity Assessment: RUE deficits/detail;LUE deficits/detail RUE Deficits / Details: R shuolder pain LUE Deficits / Details: reports previous hand surg and theraputty used at that time.    Lower Extremity Assessment Lower Extremity Assessment: Defer to PT evaluation;LLE deficits/detail LLE Deficits / Details: LLE foot drop- pt wants to know if foot will return. pt asking questions about bone stimulator. Pt encouraged to talk to doctor about these questions LLE Sensation: decreased proprioception   Cervical / Trunk Assessment Cervical / Trunk Assessment: Other exceptions (s/p surg)   Communication Communication Communication: No difficulties   Cognition Arousal/Alertness: Awake/alert Behavior During Therapy: WFL for tasks assessed/performed Overall Cognitive Status: Within Functional  Limits for tasks assessed                     General Comments       Exercises       Shoulder Instructions      Home Living Family/patient expects to be discharged to:: Private residence Living Arrangements: Alone Available Help at Discharge: Family;Friend(s);Available PRN/intermittently Type of Home: House Home Access: Stairs to enter [employer] of Steps: 3 Entrance Stairs-Rails: Right;Left Home Layout: Two level;Able to live on main level with bedroom/bathroom     Bathroom Shower/Tub: Chief Strategy Officer: Standard     Home Equipment: Environmental consultant - 2 wheels;Cane - single point;Adaptive equipment (grab bar that can be placed in shower) Adaptive Equipment: Reacher        Prior Functioning/Environment Level of Independence: Independent             OT Diagnosis: Generalized weakness;Acute pain   OT Problem List:     OT Treatment/Interventions:      OT Goals(Current goals can be found in the care plan section) Acute Rehab OT Goals Patient Stated Goal: to go home  OT Frequency:     Barriers to D/C:            Co-evaluation              End of Session Equipment Utilized During Treatment: Rolling walker;Cervical collar Nurse Communication: Mobility status;Precautions  Activity Tolerance: Patient tolerated treatment well Patient left: in bed;with call bell/phone within reach;with family/visitor present   Time: 0800-0821 OT Time Calculation (min): 21 min Charges:  OT General Charges $OT Visit: 1 Procedure OT Evaluation $OT Eval Moderate Complexity: 1 Procedure G-Codes:    Boone Master B 2015/11/20, 8:31 AM   Mateo Flow   OTR/L Pager: [phone] Office: [phone] .

## 2015-11-07 NOTE — Progress Notes (Signed)
    Subjective: Procedure(s) (LRB): ANTERIOR CERVICAL DISCECTOMY  C4-5, C6 CORPECTOMY, FUSION C4-7 (N/A) 1 Day Post-Op  Patient reports pain as 3 on 0-10 scale.  Reports decreased arm pain reports incisional neck pain   Positive void Negative bowel movement Positive flatus Negative chest pain or shortness of breath  Objective: Vital signs in last 24 hours: Temp:  [97.1 F (36.2 C)-98.2 F (36.8 C)] 98.2 F (36.8 C) (06/09 1246) Pulse Rate:  [73-90] 85 (06/09 1246) Resp:  [16-18] 18 (06/09 1246) BP: (147-167)/(60-88) 158/78 mmHg (06/09 1246) SpO2:  [93 %-95 %] 95 % (06/09 1246)  Intake/Output from previous day: 06/08 0701 - 06/09 0700 In: 2200 [I.V.:2200] Out: 3387 [Urine:3100; Drains:112; Blood:175]  Labs:  Recent Labs  11/05/15 1332  WBC 6.2  RBC 4.50  HCT 42.2  PLT 244    Recent Labs  11/05/15 1332  NA 141  K 3.7  CL 106  CO2 27  BUN 21*  CREATININE 0.87  GLUCOSE 121*  CALCIUM 9.8   No results for input(s): LABPT, INR in the last 72 hours.  Physical Exam: Intact pulses distally Incision: moderate drainage Compartment soft  Assessment/Plan: Patient stable  xrays n/a Mobilization with physical therapy Encourage incentive spirometry Continue care  Advance diet Up with therapy  Drainage persists.  Incision is intact. No expressible drainage from incision.  Drainage via drain site No swallowing or breathing issues Given the issue with dural thinning I am concerned this may be a dural leak.  Will keep HOB elevated, dressing applied Drain site sealed with steri strip Will re-evaluate in AM May need to consider re-exploration if persist.  Given complexity of that I would favor monitor for now.  Discussed with patient and husband - agree with the plan.   Venita Lickahari Pamla Pangle, MD [facility] [phone]

## 2015-11-07 NOTE — Progress Notes (Signed)
    Subjective: Procedure(s) (LRB): ANTERIOR CERVICAL DISCECTOMY  C4-5, C6 CORPECTOMY, FUSION C4-7 (N/A) 1 Day Post-Op  Patient reports pain as 3 on 0-10 scale.  Reports decreased arm pain reports incisional neck pain   Positive void Negative bowel movement Positive flatus Negative chest pain or shortness of breath  Objective: Vital signs in last 24 hours: Temp:  [97.1 F (36.2 C)-98.5 F (36.9 C)] 98 F (36.7 C) (06/09 0537) Pulse Rate:  [73-95] 80 (06/09 0537) Resp:  [13-18] 18 (06/09 0537) BP: (147-167)/(60-88) 163/88 mmHg (06/09 0537) SpO2:  [92 %-95 %] 95 % (06/09 0537)  Intake/Output from previous day: 06/08 0701 - 06/09 0700 In: 2200 [I.V.:2200] Out: 3373 [Urine:3100; Drains:98; Blood:175]  Labs:  Recent Labs  11/05/15 1332  WBC 6.2  RBC 4.50  HCT 42.2  PLT 244    Recent Labs  11/05/15 1332  NA 141  K 3.7  CL 106  CO2 27  BUN 21*  CREATININE 0.87  GLUCOSE 121*  CALCIUM 9.8   No results for input(s): LABPT, INR in the last 72 hours.  Physical Exam: Intact pulses distally Incision: moderate drainage Compartment soft Neuro:  Left foot drop persists (present pre-op)  Right LE - generalized strength is improved  Ambulating with walker  5/5 in the UE  Drain: 112 Wound: no swelling, intact,  serous drainage is noted.  Dressings changed and drain removed. No tracheal deviation noted.   No headaches  Assessment/Plan: Patient stable  xrays satisfactory.  No soft tissue swelling or tracheal deviation Mobilization with physical therapy Encourage incentive spirometry Continue care  Advance diet Up with therapy  Will re-evaluate wound in AM No clinical evidence of CSF leak.  Will continue to monitor. ASPEN at all times Dressing changes as needed. Plan on d/c Saturday as long as remains stable and wound is ok  Tara Lickahari Enrika Aguado, MD [facility] [phone]

## 2015-11-07 NOTE — Progress Notes (Signed)
Dressing changed per order d/t drainage noted. Patient alert with no c/o difficulty swallowing or breathing problem. Physician notified ans new orders received.

## 2015-11-07 NOTE — Evaluation (Signed)
Physical Therapy Evaluation Patient Details Name: Tara Cruz MRN: [id number] DOB: [birth year] Today's Date: 11/07/2015   History of Present Illness  Patient is a 65 yo female s/p ANTERIOR CERVICAL DISCECTOMY C4-5, C6 CORPECTOMY, FUSION C4-7/ PMH included CAD, HTN, hyperlipidemia, RBBB, asthma, COPD, emphysema. Current smoker. BMI 25. S/p L3-4 decompression and posterior spinal fusion 01/15/15   Clinical Impression  Patient demonstrates deficits in functional mobility as indicated below. Will benefit from continued skilled PT to address deficits and maximize function. Will see as indicated and progress as tolerated.     Follow Up Recommendations No PT follow up;Supervision - Intermittent    Equipment Recommendations  None recommended by PT    Recommendations for Other Services       Precautions / Restrictions Precautions Precautions: Cervical Precaution Comments: reviewed and provided handout Required Braces or Orthoses: Cervical Brace Cervical Brace: Hard collar Restrictions Weight Bearing Restrictions: No      Mobility  Bed Mobility Overal bed mobility: Modified Independent             General bed mobility comments: Not physical assist required, good technique  Transfers Overall transfer level: Needs assistance Equipment used: Rolling walker (2 wheeled) Transfers: Sit to/from Stand Sit to Stand: Supervision         General transfer comment: VCs for hand placement and safety with use of RW, cued to not abandon RW when going to sit down  Ambulation/Gait Ambulation/Gait assistance: Supervision Ambulation Distance (Feet): 320 Feet Assistive device: Rolling walker (2 wheeled) Gait Pattern/deviations: Step-through pattern;Decreased stride length;Decreased dorsiflexion - left;Narrow base of support Gait velocity: decreased Gait velocity interpretation: Below normal speed for age/gender General Gait Details: steady with use of RW  Stairs Stairs: Yes Stairs  assistance: Supervision Stair Management: One rail Right;Step to pattern;Forwards Number of Stairs: 4 General stair comments: heavy reliance on rail, cued for sequencing and technique re: blind negotiation and weak LE  Wheelchair Mobility    Modified Rankin (Stroke Patients Only)       Balance Overall balance assessment: History of Falls                                           Pertinent Vitals/Pain Pain Assessment: 0-10 Pain Score: 4  Pain Location: neck and shoulders (R>L) Pain Descriptors / Indicators: Aching Pain Intervention(s): Limited activity within patient's tolerance;Monitored during session;Repositioned    Home Living Family/patient expects to be discharged to:: Private residence Living Arrangements: Alone Available Help at Discharge: Family Type of Home: House Home Access: Stairs to enter Entrance Stairs-Rails: Doctor, general practice of Steps: 3 Home Layout: Two level;Able to live on main level with bedroom/bathroom Home Equipment: Dan Humphreys - 2 wheels;Cane - single point (grab bar that can be placed in shower)      Prior Function Level of Independence: Independent               Hand Dominance   Dominant Hand: Right    Extremity/Trunk Assessment   Upper Extremity Assessment: Defer to OT evaluation           Lower Extremity Assessment: LLE deficits/detail   LLE Deficits / Details: LLE weakness, decreased dorsiflexion (foot frop x4 months)     Communication   Communication: No difficulties  Cognition Arousal/Alertness: Awake/alert Behavior During Therapy: WFL for tasks assessed/performed Overall Cognitive Status: Within Functional Limits for tasks assessed  General Comments General comments (skin integrity, edema, etc.): drain present in cervical incision    Exercises        Assessment/Plan    PT Assessment Patient needs continued PT services (encourage continued mobility  with staff)  PT Diagnosis Difficulty walking;Abnormality of gait;Generalized weakness;Acute pain   PT Problem List Decreased strength;Decreased activity tolerance;Decreased balance;Decreased mobility  PT Treatment Interventions DME instruction;Gait training;Stair training;Functional mobility training;Therapeutic activities;Therapeutic exercise;Balance training;Patient/family education   PT Goals (Current goals can be found in the Care Plan section) Acute Rehab PT Goals Patient Stated Goal: to go home PT Goal Formulation: With patient Time For Goal Achievement: 11/21/15 Potential to Achieve Goals: Good    Frequency Min 5X/week   Barriers to discharge        Co-evaluation               End of Session Equipment Utilized During Treatment: Gait belt;Cervical collar Activity Tolerance: Patient tolerated treatment well Patient left: in bed;with call bell/phone within reach;with family/visitor present (sitting EOB) Nurse Communication: Mobility status         Time: [id number] PT Time Calculation (min) (ACUTE ONLY): 17 min   Charges:   PT Evaluation $PT Eval Moderate Complexity: 1 Procedure     PT G CodesFabio Asa:        Andra Heslin J 11/07/2015, 8:14 AM Charlotte Crumbevon Henritta Mutz, PT DPT  [phone]

## 2015-11-08 MED ORDER — ONDANSETRON HCL 4 MG PO TABS: 4.0000 mg | ORAL_TABLET | Freq: Three times a day (TID) | ORAL | Status: DC | PRN

## 2015-11-08 MED ORDER — OXYCODONE-ACETAMINOPHEN 10-325 MG PO TABS: 1.0000 | ORAL_TABLET | ORAL | Status: DC | PRN

## 2015-11-08 MED ORDER — METHOCARBAMOL 500 MG PO TABS: 500.0000 mg | ORAL_TABLET | Freq: Three times a day (TID) | ORAL | Status: DC | PRN

## 2015-11-08 NOTE — Progress Notes (Signed)
    Subjective: Procedure(s) (LRB): ANTERIOR CERVICAL DISCECTOMY  C4-5, C6 CORPECTOMY, FUSION C4-7 (N/A) 2 Days Post-Op  Patient reports pain as 3 on 0-10 scale.  Reports decreased arm pain reports incisional neck pain   Positive void Positive bowel movement Positive flatus Negative chest pain or shortness of breath  Objective: Vital signs in last 24 hours: Temp:  [98.2 F (36.8 C)-99.2 F (37.3 C)] 99.2 F (37.3 C) (06/10 0540) Pulse Rate:  [74-85] 83 (06/10 0540) Resp:  [17-18] 18 (06/10 0540) BP: (130-182)/(58-94) 151/77 mmHg (06/10 0540) SpO2:  [92 %-97 %] 92 % (06/10 0540)  Intake/Output from previous day:    Labs:  Recent Labs  11/05/15 1332 11/07/15 1512  WBC 6.2 12.9*  RBC 4.50 4.62  HCT 42.2 43.4  PLT 244 271    Recent Labs  11/05/15 1332  NA 141  K 3.7  CL 106  CO2 27  BUN 21*  CREATININE 0.87  GLUCOSE 121*  CALCIUM 9.8   No results for input(s): LABPT, INR in the last 72 hours.  Physical Exam: ABD soft Intact pulses distally Incision: dressing C/D/I and no drainage Compartment soft Ambulating Neuro exam: slightly improved LE strength.  Foot drop persists Minor swelling at incision site - no dysphasia/dysphonia Wound intact: no purulent drainage or erythema  Assessment/Plan: Patient stable  Mobilization with physical therapy Encourage incentive spirometry Continue care  Exam improved Will d/c to home with f/u in 1 week for wound check Patient will call or return to ER for SOB/difficulty breathing or swallowing, or return in drainage Discussed at length need to stop smoking  Venita Lickahari Malaina Mortellaro, MD [facility] [phone]

## 2015-11-08 NOTE — Progress Notes (Signed)
Pt d/c home by car with family. Assessment stable. Prescriptions given. All questions answered

## 2015-11-12 ENCOUNTER — Encounter (HOSPITAL_COMMUNITY): Payer: Self-pay | Admitting: Orthopedic Surgery

## 2015-11-13 ENCOUNTER — Telehealth: Payer: Self-pay | Admitting: *Deleted

## 2015-11-13 NOTE — Telephone Encounter (Signed)
LVM informing patient, per Dr Marjory LiesPenumalli, her lab results are normal. Left name, number for questions.

## 2015-11-14 NOTE — Discharge Summary (Signed)
Patient ID: Tara Cruz MRN: [id number] DOB/AGE: [birth year] 65 y.o.  Admit date: 11/06/2015 Discharge date: 11/14/2015  Admission Diagnoses:  Active Problems:   Neck pain   Discharge Diagnoses:  Active Problems:   Neck pain  status post Procedure(s): ANTERIOR CERVICAL DISCECTOMY  C4-5, C6 CORPECTOMY, FUSION C4-7  Past Medical History  Diagnosis Date  . Allergy   . Hyperlipidemia   . Asthma   . Hypertension   . Right bundle branch block   . Abnormal stress test 01/03/2013    false positive by Dr. Allyson Sabal  . CAD (coronary artery disease) 01/30/2013    single vessel disease of small second diagonal branch, normal EF 65%  . Tobacco use     has tried to stop numerous times  . H/O cardiovascular stress test 2014  . Arthritis     degenerative lumbar spine   . COPD (chronic obstructive pulmonary disease) (HCC)     reviewed by Dr. Sherene Sires  . Emphysema lung (HCC)   . Depression   . Neuropathy (HCC)     Surgeries: Procedure(s): ANTERIOR CERVICAL DISCECTOMY  C4-5, C6 CORPECTOMY, FUSION C4-7 on 11/06/2015   Consultants:    Discharged Condition: Improved  Hospital Course: Tara Cruz is an 65 y.o. female who was admitted 11/06/2015 for operative treatment of <principal problem not specified>. Patient failed conservative treatments (please see the history and physical for the specifics) and had severe unremitting pain that affects sleep, daily activities and work/hobbies. After pre-op clearance, the patient was taken to the operating room on 11/06/2015 and underwent  Procedure(s): ANTERIOR CERVICAL DISCECTOMY  C4-5, C6 CORPECTOMY, FUSION C4-7.  Pt was discharged on 11/08/15.  Hospital course was uneventful.   Patient was given perioperative antibiotics:  Anti-infectives    Start     Dose/Rate Route Frequency Ordered Stop   11/06/15 1700  ceFAZolin (ANCEF) IVPB 1 g/50 mL premix     1 g 100 mL/hr over 30 Minutes Intravenous Every 8 hours 11/06/15 1626 11/07/15 0042   11/06/15  0551  ceFAZolin (ANCEF) IVPB 2g/100 mL premix     2 g 200 mL/hr over 30 Minutes Intravenous 30 min pre-op 11/06/15 0551 11/06/15 1225   11/06/15 0539  ceFAZolin (ANCEF) 2-4 GM/100ML-% IVPB    Comments:  Alferd Apa   : cabinet override      11/06/15 0539 11/06/15 1744       Patient was given sequential compression devices and early ambulation to prevent DVT.   Patient benefited maximally from hospital stay and there were no complications. At the time of discharge, the patient was urinating/moving their bowels without difficulty, tolerating a regular diet, pain is controlled with oral pain medications and they have been cleared by PT/OT.   Recent vital signs: No data found.    Recent laboratory studies: No results for input(s): WBC, HGB, HCT, PLT, NA, K, CL, CO2, BUN, CREATININE, GLUCOSE, INR, CALCIUM in the last 72 hours.  Invalid input(s): PT, 2   Discharge Medications:     Medication List    STOP taking these medications        aspirin EC 81 MG tablet     celecoxib 200 MG capsule  Commonly known as:  CELEBREX     zolpidem 5 MG tablet  Commonly known as:  AMBIEN      TAKE these medications        albuterol 108 (90 Base) MCG/ACT inhaler  Commonly known as:  PROVENTIL HFA;VENTOLIN HFA  Inhale 1 puff  into the lungs every 4 (four) hours as needed for wheezing or shortness of breath.     CALCIUM + D3 600-800 MG-UNIT Tabs  Generic drug:  Calcium Carb-Cholecalciferol  Take 1 tablet by mouth daily.     docusate sodium 100 MG capsule  Commonly known as:  COLACE  Take 1 capsule (100 mg total) by mouth 3 (three) times daily as needed for mild constipation.     estradiol 0.5 MG tablet  Commonly known as:  ESTRACE  Take 0.5 mg by mouth daily.     Fluticasone-Salmeterol 250-50 MCG/DOSE Aepb  Commonly known as:  ADVAIR  Inhale 1 puff into the lungs 2 (two) times daily as needed (For shortness of breath.).     GLUCOSAMINE MSM COMPLEX PO  Take 1 tablet by mouth daily.       methocarbamol 500 MG tablet  Commonly known as:  ROBAXIN  Take 1 tablet (500 mg total) by mouth 3 (three) times daily as needed for muscle spasms.     methocarbamol 500 MG tablet  Commonly known as:  ROBAXIN  Take 1 tablet (500 mg total) by mouth 3 (three) times daily as needed for muscle spasms.     multivitamin with minerals Tabs tablet  Take 1 tablet by mouth daily.     ondansetron 4 MG tablet  Commonly known as:  ZOFRAN  Take 1 tablet (4 mg total) by mouth every 8 (eight) hours as needed for nausea or vomiting.     oxyCODONE-acetaminophen 10-325 MG tablet  Commonly known as:  PERCOCET  Take 1 tablet by mouth every 4 (four) hours as needed for pain.     oxyCODONE-acetaminophen 10-325 MG tablet  Commonly known as:  PERCOCET  Take 1 tablet by mouth every 4 (four) hours as needed for pain.     simvastatin 40 MG tablet  Commonly known as:  ZOCOR  Take 1 tablet (40 mg total) by mouth at bedtime.     traMADol 50 MG tablet  Commonly known as:  ULTRAM  Take 50 mg by mouth every 6 (six) hours as needed (For pain.).     TYLENOL ARTHRITIS PAIN 650 MG CR tablet  Generic drug:  acetaminophen  Take 650 mg by mouth every 8 (eight) hours as needed for pain.        Diagnostic Studies: Dg Cervical Spine 2 Or 3 Views  11/06/2015  CLINICAL DATA:  Status post ACDF of the cervical spine. EXAM: CERVICAL SPINE - 2-3 VIEW COMPARISON:  11/06/2015. FINDINGS: Anterior cervical disc fusion has been performed at C4 through C7. There has been C6 corpectomy with cage material placement. Interbody spacer device is noted at the C4-5 level. IMPRESSION: 1. Status post C6 corpectomy and C4 through C7 anterior cervical disc fusion. Electronically Signed   By: Signa Kell M.D.   On: 11/06/2015 15:30   Dg Cervical Spine 2-3 Views  11/06/2015  CLINICAL DATA:  Anterior cervical discectomy fusion C4 through C7. C6 corpectomy. Fluoroscopy time of 35 seconds. EXAM: DG C-ARM GT 120 MIN; CERVICAL SPINE - 2-3  VIEW FLUOROSCOPY TIME:  Fluoroscopy Time (in minutes and seconds): 35 seconds Number of Acquired Images:  2 COMPARISON:  None. FINDINGS: Two intraoperative fluoroscopic spot images are provided of the cervical spine. Anterior cervical fusion hardware appears appropriately positioned at the C4 through C7 vertebral body levels. There are also surgical changes of C6 corpectomy with replacement by cage. Additional disc spacer noted at the C4-5 disc space. Hardware appears intact and appropriately  positioned throughout. No evidence of surgical complicating feature. Overall osseous alignment is anatomic. IMPRESSION: Surgical changes, as detailed above. Hardware appears intact and appropriately positioned. No evidence of surgical complicating feature. Fluoroscopy provided for 35 seconds. Electronically Signed   By: Bary RichardStan  Maynard M.D.   On: 11/06/2015 13:22   Dg C-arm Gt 120 Min  11/06/2015  CLINICAL DATA:  Anterior cervical discectomy fusion C4 through C7. C6 corpectomy. Fluoroscopy time of 35 seconds. EXAM: DG C-ARM GT 120 MIN; CERVICAL SPINE - 2-3 VIEW FLUOROSCOPY TIME:  Fluoroscopy Time (in minutes and seconds): 35 seconds Number of Acquired Images:  2 COMPARISON:  None. FINDINGS: Two intraoperative fluoroscopic spot images are provided of the cervical spine. Anterior cervical fusion hardware appears appropriately positioned at the C4 through C7 vertebral body levels. There are also surgical changes of C6 corpectomy with replacement by cage. Additional disc spacer noted at the C4-5 disc space. Hardware appears intact and appropriately positioned throughout. No evidence of surgical complicating feature. Overall osseous alignment is anatomic. IMPRESSION: Surgical changes, as detailed above. Hardware appears intact and appropriately positioned. No evidence of surgical complicating feature. Fluoroscopy provided for 35 seconds. Electronically Signed   By: Bary RichardStan  Maynard M.D.   On: 11/06/2015 13:22          Follow-up  Information    Follow up with Venita LickBROOKS,DAHARI D, MD. Schedule an appointment as soon as possible for a visit in 2 weeks.   Specialty:  Orthopedic Surgery   Why:  If symptoms worsen, For suture removal   Contact information:   [address] [place] [address] [phone]       Discharge Plan:  discharge to   Disposition:     Signed: MayoBaxter Kail, Anelis Hrivnak Christina for Dr. Venita Lickahari Brooks [facility] [phone] 11/14/2015, 12:40 PM

## 2015-11-18 ENCOUNTER — Encounter (HOSPITAL_COMMUNITY): Payer: Self-pay | Admitting: Orthopedic Surgery

## 2015-11-20 ENCOUNTER — Encounter (HOSPITAL_COMMUNITY): Payer: Self-pay | Admitting: Orthopedic Surgery

## 2015-12-06 NOTE — Anesthesia Postprocedure Evaluation (Signed)
Anesthesia Post Note  Patient: Tara Bottcheratricia A Cruz  Procedure(s) Performed: Procedure(s) (LRB): ANTERIOR CERVICAL DISCECTOMY  C4-5, C6 CORPECTOMY, FUSION C4-7 (N/A)  Patient location during evaluation: PACU Anesthesia Type: General Level of consciousness: awake, awake and alert and oriented Pain management: pain level controlled Vital Signs Assessment: post-procedure vital signs reviewed and stable Respiratory status: spontaneous breathing, nonlabored ventilation and respiratory function stable Cardiovascular status: blood pressure returned to baseline Anesthetic complications: no    Last Vitals:  Filed Vitals:   11/08/15 0214 11/08/15 0540  BP: 139/71 151/77  Pulse: 85 83  Temp: 37 C 37.3 C  Resp: 17 18    Last Pain:  Filed Vitals:   11/08/15 0801  PainSc: 4                  Nakisha Chai COKER

## 2015-12-17 ENCOUNTER — Telehealth: Payer: Self-pay | Admitting: Diagnostic Neuroimaging

## 2015-12-17 NOTE — Telephone Encounter (Signed)
Spoke with patient who stated she saw Dr Shon BatonBrooks yesterday for 6 week post op visit s/p cervical spine surgery. She stated he removed one disk and that her C4-5 "were completely closed off" . She stated that her legs have gotten so weak and her lower back pain has gotten so bad that Dr Shon BatonBrooks instructed her to use wheelchair more often than walker. She is using both at home. She stated Dr Shon BatonBrooks did an x ray of her neck and lower back yesterday which did not show any new issues. She is scheduled for MRI  cervical spine tomorrow and follow up with Dr Shon BatonBrooks next Monday. She had been getting PT/OT at home for her neck but was getting progressively worse with leg weakness, back pain, so it is on hold. She stated "something needs to be done."  She is currently taking APAP 650 mg 3 x daily with Robaxin  500 mg 3 x daily. She stated this "brings her pain to a level 2/10". She only takes Percocet occasionally, stated she " doesn't want to get hooked on it".  She is requesting NCS to be done asap, stated "Dr Shon BatonBrooks is waiting for those results before doing anything".  Informed her that as for NCS, [facility] who schedule those, the next available for Dr Marjory LiesPenumalli is 01/22/16. Then advised her that this RN will route to work in dr however, he likely will suggest waiting for MRI cervical spine results and FU with Dr Shon BatonBrooks before ordering NCS for her.  She verbalized understanding of call, appreciation.

## 2015-12-17 NOTE — Telephone Encounter (Signed)
I agree with your plan. If possible put her on a cancellation list for an earlier nerve conduction appointment when available

## 2015-12-17 NOTE — Telephone Encounter (Signed)
Per Dr Pearlean BrownieSethi, patient should have MRI completed, Dr Marjory LiesPenumalli read results then determine whether patient needs NCS. Spoke with patient and informed her of above. Advised she have MRI result faxed to Dr Marjory LiesPenumalli, gave her fax number for this RN's pod.  Advised her that if Dr Marjory LiesPenumalli orders NCS, she may be able to be seen sooner than 01/22/16 depending [facility]. She verbalized understanding, appreciation.

## 2015-12-17 NOTE — Telephone Encounter (Signed)
Pt called sts Dr Demetrius CharityP discussed with her about [facility]. Pt sts she has went from a walker to wheelchair as of yesterday (per Dr Shon BatonBrooks). She is having cervical CT tomorrow. Pt sts she can not stand, it is very painful. Pt wants to have NCV lumbar asap. Pt was advised Dr Demetrius CharityP will not be in the office next week. She is requesting another provider to order ncv/she was advised this may have to wait until Dr Demetrius CharityP returns to decide her plan of care. Please call

## 2015-12-18 ENCOUNTER — Other Ambulatory Visit: Payer: Self-pay | Admitting: Orthopedic Surgery

## 2015-12-18 DIAGNOSIS — Z4789 Encounter for other orthopedic aftercare: Secondary | ICD-10-CM

## 2015-12-18 DIAGNOSIS — Z981 Arthrodesis status: Secondary | ICD-10-CM

## 2015-12-29 ENCOUNTER — Ambulatory Visit
Admission: RE | Admit: 2015-12-29 | Discharge: 2015-12-29 | Disposition: A | Discharge status: Home / Self Care | Payer: Medicare Other | Source: Ambulatory Visit | Attending: Orthopedic Surgery | Admitting: Orthopedic Surgery

## 2015-12-29 DIAGNOSIS — Z4789 Encounter for other orthopedic aftercare: Secondary | ICD-10-CM

## 2015-12-29 DIAGNOSIS — Z981 Arthrodesis status: Secondary | ICD-10-CM

## 2015-12-29 MED ORDER — GADOBENATE DIMEGLUMINE 529 MG/ML IV SOLN: 15.0000 mL | Freq: Once | INTRAVENOUS | Status: DC | PRN

## 2015-12-31 ENCOUNTER — Telehealth: Payer: Self-pay | Admitting: Diagnostic Neuroimaging

## 2015-12-31 ENCOUNTER — Telehealth: Payer: Self-pay | Admitting: *Deleted

## 2015-12-31 NOTE — Telephone Encounter (Signed)
Pt called in wanting to know if she needs ncv/emg. If so to please schedule it along with a f/u appt the same day. Please call and advise

## 2015-12-31 NOTE — Telephone Encounter (Signed)
Per Dr Marjory Lies, spoke with patient and informed her he does not recommend NCS at this time, and he will discuss further in her follow up on 01/06/16. Patient stated she will not be able to come for that appt, stated her husband has to take off work and does not get paid. She stated she has too many dr's appts and he cannot miss that much work. She has FU with Dr Shon Baton this Caleen Essex to discuss MRI results. She requested her FU on 01/06/16 be cancelled. This RN requested she call back when she is ready to schedule FU with Dr Marjory Lies.  She verbalized understanding, agreement.

## 2016-01-06 ENCOUNTER — Ambulatory Visit: Payer: Medicare Other | Admitting: Diagnostic Neuroimaging

## 2016-01-13 ENCOUNTER — Encounter (HOSPITAL_COMMUNITY): Payer: Self-pay | Admitting: *Deleted

## 2016-01-13 ENCOUNTER — Inpatient Hospital Stay (HOSPITAL_COMMUNITY)
Admission: AD | Admit: 2016-01-13 | Discharge: 2016-01-16 | DRG: 093 | Disposition: A | Discharge status: Skilled Nursing Facility | Payer: Medicare Other | Source: Ambulatory Visit | Attending: Orthopedic Surgery | Admitting: Orthopedic Surgery

## 2016-01-13 ENCOUNTER — Inpatient Hospital Stay (HOSPITAL_COMMUNITY): Payer: Medicare Other

## 2016-01-13 ENCOUNTER — Telehealth: Payer: Self-pay | Admitting: Diagnostic Neuroimaging

## 2016-01-13 DIAGNOSIS — G629 Polyneuropathy, unspecified: Secondary | ICD-10-CM | POA: Diagnosis present

## 2016-01-13 DIAGNOSIS — Z9181 History of falling: Secondary | ICD-10-CM | POA: Diagnosis not present

## 2016-01-13 DIAGNOSIS — F17210 Nicotine dependence, cigarettes, uncomplicated: Secondary | ICD-10-CM | POA: Diagnosis present

## 2016-01-13 DIAGNOSIS — M2578 Osteophyte, vertebrae: Secondary | ICD-10-CM | POA: Diagnosis present

## 2016-01-13 DIAGNOSIS — F329 Major depressive disorder, single episode, unspecified: Secondary | ICD-10-CM | POA: Diagnosis present

## 2016-01-13 DIAGNOSIS — I2510 Atherosclerotic heart disease of native coronary artery without angina pectoris: Secondary | ICD-10-CM | POA: Diagnosis present

## 2016-01-13 DIAGNOSIS — Z8249 Family history of ischemic heart disease and other diseases of the circulatory system: Secondary | ICD-10-CM | POA: Diagnosis not present

## 2016-01-13 DIAGNOSIS — E785 Hyperlipidemia, unspecified: Secondary | ICD-10-CM | POA: Diagnosis present

## 2016-01-13 DIAGNOSIS — W19XXXA Unspecified fall, initial encounter: Secondary | ICD-10-CM

## 2016-01-13 DIAGNOSIS — Z79899 Other long term (current) drug therapy: Secondary | ICD-10-CM

## 2016-01-13 DIAGNOSIS — J449 Chronic obstructive pulmonary disease, unspecified: Secondary | ICD-10-CM | POA: Diagnosis present

## 2016-01-13 DIAGNOSIS — Z981 Arthrodesis status: Secondary | ICD-10-CM | POA: Diagnosis not present

## 2016-01-13 DIAGNOSIS — I10 Essential (primary) hypertension: Secondary | ICD-10-CM | POA: Diagnosis present

## 2016-01-13 DIAGNOSIS — R29898 Other symptoms and signs involving the musculoskeletal system: Secondary | ICD-10-CM | POA: Diagnosis present

## 2016-01-13 DIAGNOSIS — R531 Weakness: Secondary | ICD-10-CM | POA: Diagnosis present

## 2016-01-13 DIAGNOSIS — G9520 Unspecified cord compression: Principal | ICD-10-CM | POA: Diagnosis present

## 2016-01-13 HISTORY — DX: Unspecified fall, initial encounter: W19.XXXA

## 2016-01-13 LAB — CBC
HCT: 45 % (ref 36.0–46.0)
Hemoglobin: 14.4 g/dL (ref 12.0–15.0)
MCH: 31 pg (ref 26.0–34.0)
MCHC: 32 g/dL (ref 30.0–36.0)
MCV: 97 fL (ref 78.0–100.0)
PLATELETS: 323 10*3/uL (ref 150–400)
RBC: 4.64 MIL/uL (ref 3.87–5.11)
RDW: 14.4 % (ref 11.5–15.5)
WBC: 6.6 10*3/uL (ref 4.0–10.5)

## 2016-01-13 LAB — COMPREHENSIVE METABOLIC PANEL
ALBUMIN: 3.8 g/dL (ref 3.5–5.0)
ALK PHOS: 52 U/L (ref 38–126)
ALT: 25 U/L (ref 14–54)
AST: 26 U/L (ref 15–41)
Anion gap: 8 (ref 5–15)
BUN: 17 mg/dL (ref 6–20)
CALCIUM: 10.2 mg/dL (ref 8.9–10.3)
CO2: 28 mmol/L (ref 22–32)
CREATININE: 0.65 mg/dL (ref 0.44–1.00)
Chloride: 104 mmol/L (ref 101–111)
GFR calc Af Amer: >60 mL/min (ref >60)
GFR calc non Af Amer: >60 mL/min (ref >60)
GLUCOSE: 104 mg/dL — AB (ref 65–99)
Potassium: 4.4 mmol/L (ref 3.5–5.1)
SODIUM: 140 mmol/L (ref 135–145)
Total Bilirubin: 0.7 mg/dL (ref 0.3–1.2)
Total Protein: 6.4 g/dL — ABNORMAL LOW (ref 6.5–8.1)

## 2016-01-13 LAB — MRSA PCR SCREENING: MRSA BY PCR: NEGATIVE

## 2016-01-13 MED ORDER — ACETAMINOPHEN 650 MG RE SUPP: 650.0000 mg | Freq: Four times a day (QID) | RECTAL | Status: DC | PRN

## 2016-01-13 MED ORDER — DOCUSATE SODIUM 100 MG PO CAPS
100.0000 mg | ORAL_CAPSULE | Freq: Two times a day (BID) | ORAL | Status: DC
  Administered 2016-01-13 – 2016-01-16 (×6): 100 mg via ORAL
  Filled 2016-01-13 (×6): qty 1

## 2016-01-13 MED ORDER — ENSURE ENLIVE PO LIQD
237.0000 mL | Freq: Two times a day (BID) | ORAL | Status: DC
  Administered 2016-01-13 – 2016-01-16 (×5): 237 mL via ORAL
  Filled 2016-01-13 (×10): qty 237

## 2016-01-13 MED ORDER — POLYETHYLENE GLYCOL 3350 17 G PO PACK: 17.0000 g | PACK | Freq: Every day | ORAL | Status: DC | PRN

## 2016-01-13 MED ORDER — ASPIRIN EC 325 MG PO TBEC
325.0000 mg | DELAYED_RELEASE_TABLET | Freq: Every day | ORAL | Status: DC
  Administered 2016-01-13 – 2016-01-16 (×4): 325 mg via ORAL
  Filled 2016-01-13 (×4): qty 1

## 2016-01-13 MED ORDER — ONDANSETRON HCL 4 MG PO TABS: 4.0000 mg | ORAL_TABLET | Freq: Four times a day (QID) | ORAL | Status: DC | PRN

## 2016-01-13 MED ORDER — ONDANSETRON HCL 4 MG/2ML IJ SOLN: 4.0000 mg | Freq: Four times a day (QID) | INTRAMUSCULAR | Status: DC | PRN

## 2016-01-13 MED ORDER — OXYCODONE HCL 5 MG PO TABS
5.0000 mg | ORAL_TABLET | ORAL | Status: DC | PRN
  Administered 2016-01-13 – 2016-01-16 (×7): 5 mg via ORAL
  Filled 2016-01-13 (×8): qty 1

## 2016-01-13 MED ORDER — ZOLPIDEM TARTRATE 5 MG PO TABS
5.0000 mg | ORAL_TABLET | Freq: Every evening | ORAL | Status: DC | PRN
  Administered 2016-01-13 – 2016-01-15 (×2): 5 mg via ORAL
  Filled 2016-01-13 (×2): qty 1

## 2016-01-13 MED ORDER — ACETAMINOPHEN 325 MG PO TABS: 650.0000 mg | ORAL_TABLET | Freq: Four times a day (QID) | ORAL | Status: DC | PRN

## 2016-01-13 NOTE — Evaluation (Signed)
Occupational Therapy Evaluation Patient Details Name: Ledora Bottcheratricia A Sevillano MRN: [id number] DOB: 1951-04-22 Today's Date: 01/13/2016    History of Present Illness Pt admitted today because of history of falls, and difficulty at home. pt is s/p ANTERIOR CERVICAL DISCECTOMY C4-5, C6 CORPECTOMY, FUSION C4-7 on 11/06/2015. PMH included CAD, HTN, hyperlipidemia, RBBB, asthma, COPD, emphysema. Current smoker. BMI 25. S/p L3-4 decompression and posterior spinal fusion 01/15/15    Clinical Impression   This 65 yo female admitted for above presents to acute OT with deficits below (see OT problem list) thus affecting her PLOF of Mod I to Independent 2 weeks post sx last surgery on 11/06/2015 and then has been getting weaker and weaker since then with several falls (mostly on steps and in/out of tub). She is not safe to be alone at home (which she currently is during day hours). She would benefit from a CIR stay to hopefully build back up her strength and look at modifications that would make her safer at home. We will continue to follow acutely.    Follow Up Recommendations  CIR    Equipment Recommendations  Other (comment) (TBD at next venue)       Precautions / Restrictions Precautions Precautions: Fall Required Braces or Orthoses: Other Brace/Splint Other Brace/Splint: pt reports he has AFO brace at home but hasn't been able to use it since she has not found a pair of shoes that work well with it. Restrictions Weight Bearing Restrictions: No      Mobility Bed Mobility Overal bed mobility: Modified Independent             General bed mobility comments: increaed time and use of rail  Transfers Overall transfer level: Needs assistance Equipment used: Rolling walker (2 wheeled) Transfers: Sit to/from Stand Sit to Stand: Min guard         General transfer comment: comes up in a bent over /stooped position and then stands fully upright'    Balance Overall balance assessment: Needs  assistance Sitting-balance support: No upper extremity supported;Feet supported Sitting balance-Leahy Scale: Fair     Standing balance support: Bilateral upper extremity supported;During functional activity Standing balance-Leahy Scale: Poor Standing balance comment: reliant on RW                            DL Overall ADL's : Needs assistance/impaired Eating/Feeding: Independent;Sitting   Grooming: Wash/dry hands;Standing;Min guard Grooming Details (indicate cue type and reason): pt has to bend forward and prop on sink Upper Body Bathing: Supervision/ safety;Set up;Sitting   Lower Body Bathing: Min guard;Sit to/from stand   Upper Body Dressing : Supervision/safety;Set up;Sitting   Lower Body Dressing: Min guard;Sit to/from stand   Toilet Transfer: Minimal assistance;Ambulation;RW;BSC (over eBaytoliet)   Toileting- ArchitectClothing Manipulation and Hygiene: Min guard;Sit to/from Nurse, children'sstand     Tub/Shower Transfer Details (indicate cue type and reason): Pt has been using a 3n1 at home and says it fits all the way in it.                   Pertinent Vitals/Pain Pain Assessment: 0-10 Pain Score: 1  Pain Location: left hip Pain Descriptors / Indicators: Sore Pain Intervention(s): Monitored during session     Hand Dominance Right   Extremity/Trunk Assessment Upper Extremity Assessment Upper Extremity Assessment: LUE deficits/detail;RUE deficits/detail RUE Deficits / Details: decreased strength in 2nd digit LUE Deficits / Details: 4th digit does not fully extend LUE Coordination: decreased fine motor  Lower Extremity Assessment Lower Extremity Assessment:  (pt with left drop foot (says she has a brace but has yet to find a shoe that works with it))       Communication Communication Communication: No difficulties   Cognition Arousal/Alertness: Awake/alert Behavior During Therapy: WFL for tasks assessed/performed Overall Cognitive Status: Within Functional Limits for  tasks assessed                                Home Living Family/patient expects to be discharged to:: Inpatient rehab Living Arrangements: Spouse/significant other Available Help at Discharge: Family;Available PRN/intermittently (fiancee works days) Type of Home: House Home Access: Stairs to enter Entergy CorporationEntrance Stairs-Number of Steps: 3 Entrance Stairs-Rails: Right;Left Home Layout: Two level;Able to live on main level with bedroom/bathroom     Bathroom Shower/Tub: Tub/shower unit;Curtain Shower/tub characteristics: Engineer, building servicesCurtain Bathroom Toilet: Standard     Home Equipment: Environmental consultantWalker - 2 wheels;Cane - single point;Adaptive equipment;Bedside commode Adaptive Equipment: Reacher        Prior Functioning/Environment Level of Independence: Independent             OT Diagnosis: Generalized weakness;Acute pain   OT Problem List: Decreased strength;Impaired UE functional use;Impaired balance (sitting and/or standing);Pain   OT Treatment/Interventions:      OT Goals(Current goals can be found in the care plan section) Acute Rehab OT Goals Patient Stated Goal: to be able to go to rehab so I can walk again without falling OT Goal Formulation: With patient Time For Goal Achievement: 01/20/16 Potential to Achieve Goals: Good  OT Frequency:                End of Session Equipment Utilized During Treatment: Gait belt;Rolling walker Nurse Communication: Mobility status (and pt in recliner)  Activity Tolerance: Patient tolerated treatment well Patient left: in chair;with call bell/phone within reach   Time: 1510-1533 OT Time Calculation (min): 23 min Charges:  OT General Charges $OT Visit: 1 Procedure OT Evaluation $OT Eval Moderate Complexity: 1 Procedure OT Treatments $Self Care/Home Management : 8-22 mins  Evette GeorgesLeonard, Orin Eberwein Eva [phone] 01/13/2016, 3:59 PM

## 2016-01-13 NOTE — Telephone Encounter (Signed)
Spoke with Porfirio Mylararmen PA, [facility] and advised her that Dr Marjory LiesPenumalli does not see patients in the hospital. The dr who admits patient to hospital will need to inform the hospitalist neurologist to see patient. Advised Dr Marjory LiesPenumalli would be glad to speak with neuro-hospitalist if needed. Also informed her that Dr Marjory LiesPenumalli will follow patient's hospital course via EMR. She verbalized understanding, appeciation for call back.

## 2016-01-13 NOTE — H&P (Signed)
History of Present Illness The patient is a 65 year old female who presents for a follow-up for Post op. Patient is 9 weeks postop following ACDF C4-7 (on 11/06/15).Overall the patient feels that they are not doing well (states she has had 2 recent falls, she could not get on the table for her injection today). Post operative pain has been 4/10. The patient does report pain (back and some in the neck), difficulty sleeping ("b/c I have to go to the bathroom every 2 hrs") and pain with range of motion, while the patient does not report swelling or tenderness The patient does indicate that these symptoms are rapidly worsening (states she can not stand, legs will not hold her). Pain medications include: Tylenol (650 mg tid) and Robaxin 500mg  . Post operative complications include weakness (can not stand or walk). The patient is currently non-weightbearing with the assistance of: wheelchair. The patient is using a Biochemist, clinicalAspen Collar. Note for "Post op": The patient states her home health social worker told her if she is admitted for a few days then she can be discharged to a rehab facility.   Problem List/Past Medical  Problems Reconciled  Allergies  No Known Drug Allergies [08/18/2012]: Allergies Reconciled  Social History  Tobacco / smoke exposure None. Tobacco use Former smoker. 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 Pain Contract no  Medication History  Robaxin (500MG  Tablet, 1 (one) Oral 1 po tid, Taken starting 12/23/2015) Active. Voltaren (1% Gel, 1 (one) Transdermal as  directed, Taken starting 01/02/2016) Active. (use as directed) Tylenol (500MG  Capsule, Oral) Active. (650mg  tid) Terbinafine HCl (250MG  Tablet, Oral) Active. Simvastatin (40MG  Tablet, Oral) Active. Multi Vit/Fl (0.25MG  Tablet Chewable, Oral) Active. (qd) Vitamin D (1000UNIT Tablet, Oral) Active. (qd) Ondansetron HCl (4MG  Tablet, Oral) Active. (prn) Xopenex HFA (45MCG/ACT Aerosol, Inhalation) Active. (prn) Estradiol (0.5MG  Tablet, Oral) Active. (qd) Vytorin (10-20MG  Tablet, Oral) Active. (qd) Medications Reconciled  Other Problems Asthma  General General Appearance-Not in acute distress. Orientation-Oriented X3. Build & Nutrition-Well nourished and Well developed.  Integumentary General Characteristics Surgical Scars - surgical scarring consistent with previous lumbar surgery and anterior neck surgical scarring consistent with previous cervical surgery. Lumbar Spine-Skin examination of the lumbar spine is without deformity, skin lesions, lacerations or abrasions.  Abdomen Palpation/Percussion Palpation and Percussion of the abdomen reveal - Soft, Non Tender and No Rebound tenderness.  Peripheral Vascular Lower Extremity Palpation - Posterior tibial pulse - Bilateral - 2+. Dorsalis pedis pulse - Bilateral - 2+.  Neurologic Sensation Lower Extremity - Bilateral - sensation is intact in the lower extremity. Reflexes Patellar Reflex - Bilateral - 2+. Achilles Reflex - Bilateral - 2+. Clonus - Bilateral - clonus not present. Hoffman's Sign - Bilateral - Hoffman's sign not present. Testing Seated Straight Leg Raise - Bilateral - Seated straight leg raise negative.  Musculoskeletal Spine/Ribs/Pelvis  Lumbosacral Spine: Inspection and Palpation - Tenderness - left lumbar paraspinals tender to palpation, right lumbar paraspinals tender to palpation, left sacroiliac joint tender to palpation and left buttock is tender to palpation. Strength and Tone: Strength -  Hip Flexion - Left - 2/5. Right - 5/5. Knee Extension - Left -  3-/5. Right - 5/5. Knee Flexion - Left - 3-/5. Right - 5/5. Ankle Dorsiflexion - Left - 2-/5. Right - 4-/5. Ankle Plantarflexion - Left - 3-/5. Right - 5/5. Heel walk - Bilateral - unable to heel walk. Toe Walk - Bilateral - unable to walk on toes. Heel-Toe Walk - Bilateral - unable to heel-toe walk. ROM - Flexion - moderately decreased range of motion and painful. Extension - moderately decreased range of motion and painful. Left Lateral Bending - moderately decreased range of motion and painful. Right Lateral Bending - moderately decreased range of motion and painful. Right Rotation - moderately decreased range of motion and painful. Left Rotation - moderately decreased range of motion and painful. Pain - . Lumbosacral Spine - Waddell's Signs - no Waddell's signs present. Lower Extremity Range of Motion - No true hip, knee or ankle pain with range of motion. Gait and Station - Safeway Incssistive Devices - no assistive devices.  Cervical MRI: 12/29/15: Cord signal change persists at C6/7. Improved space for cord. No evidence of CSF leak.  Lumbar MRI: No acute finding or impingement  Plan: patient has ongoing myelopathic findings.  Unfortunately there has not been significant improvement despite the decompressive procedure done 6/17.  Admitted today because of history of falls, and difficulty at home. Plan:    CIR consult  Thoracic MRI to r/o secondary site of cord compression  Also work on SNF/Rehab discharge  Will also discuss with Dr Yevette Edwardsumonski for a second opinion.

## 2016-01-13 NOTE — Telephone Encounter (Signed)
Carmen May, PA/[facility] (Dr. Shon BatonBrooks) called to advise, patient is being admitted for lower extremity weakness. Dr. Shon BatonBrooks wants to know if Dr. Marjory LiesPenumalli wants to consult on this patient or if they should consult at hospital, please advise. Dr. Shon BatonBrooks referred this patient to Dr. Marjory LiesPenumalli back in June. Porfirio MylarCarmen states patient's condition has deteriorated since surgery rather than improving. (Patient cancelled 01/06/16 2 month f/u appointment with Dr. Marjory LiesPenumalli due to transportation). Please call [facility] Mayo[facility] [phone].

## 2016-01-14 LAB — URINALYSIS, ROUTINE W REFLEX MICROSCOPIC
Bilirubin Urine: NEGATIVE
Bilirubin Urine: NEGATIVE
GLUCOSE, UA: NEGATIVE mg/dL
GLUCOSE, UA: NEGATIVE mg/dL
HGB URINE DIPSTICK: NEGATIVE
HGB URINE DIPSTICK: NEGATIVE
Ketones, ur: NEGATIVE mg/dL
Ketones, ur: NEGATIVE mg/dL
LEUKOCYTES UA: NEGATIVE
LEUKOCYTES UA: NEGATIVE
Nitrite: NEGATIVE
Nitrite: NEGATIVE
PH: 6 (ref 5.0–8.0)
PH: 6.5 (ref 5.0–8.0)
PROTEIN: NEGATIVE mg/dL
PROTEIN: NEGATIVE mg/dL
SPECIFIC GRAVITY, URINE: 1.012 (ref 1.005–1.030)
Specific Gravity, Urine: 1.018 (ref 1.005–1.030)

## 2016-01-14 MED ORDER — MORPHINE SULFATE (PF) 2 MG/ML IV SOLN
2.0000 mg | INTRAVENOUS | Status: DC | PRN
  Administered 2016-01-14 – 2016-01-15 (×5): 2 mg via INTRAVENOUS
  Filled 2016-01-14 (×6): qty 1

## 2016-01-14 MED ORDER — EZETIMIBE-SIMVASTATIN 10-20 MG PO TABS
1.0000 | ORAL_TABLET | Freq: Every day | ORAL | Status: DC
  Administered 2016-01-14 – 2016-01-16 (×3): 1 via ORAL
  Filled 2016-01-14 (×3): qty 1

## 2016-01-14 MED ORDER — MOMETASONE FURO-FORMOTEROL FUM 200-5 MCG/ACT IN AERO
2.0000 | INHALATION_SPRAY | Freq: Two times a day (BID) | RESPIRATORY_TRACT | Status: DC
  Administered 2016-01-14 – 2016-01-16 (×4): 2 via RESPIRATORY_TRACT
  Filled 2016-01-14: qty 8.8

## 2016-01-14 MED ORDER — SIMVASTATIN 20 MG PO TABS: 40.0000 mg | ORAL_TABLET | Freq: Every day | ORAL | Status: DC

## 2016-01-14 MED ORDER — ALBUTEROL SULFATE (2.5 MG/3ML) 0.083% IN NEBU: 3.0000 mL | INHALATION_SOLUTION | RESPIRATORY_TRACT | Status: DC | PRN

## 2016-01-14 MED ORDER — CELECOXIB 200 MG PO CAPS
200.0000 mg | ORAL_CAPSULE | Freq: Every day | ORAL | Status: DC
  Administered 2016-01-14 – 2016-01-16 (×3): 200 mg via ORAL
  Filled 2016-01-14 (×3): qty 1

## 2016-01-14 MED ORDER — ESTRADIOL 1 MG PO TABS
0.5000 mg | ORAL_TABLET | Freq: Every day | ORAL | Status: DC
  Administered 2016-01-14 – 2016-01-16 (×3): 0.5 mg via ORAL
  Filled 2016-01-14 (×3): qty 0.5

## 2016-01-14 NOTE — Progress Notes (Signed)
Thank you for consult on Tara Cruz.  Chart reviewed and patient examined. Not follow up thoracic spine without acute changes. She does not meet criteria to justify inpatient rehab stay. Will defer consult for now.

## 2016-01-14 NOTE — Consult Note (Signed)
Reason for Consult: Progressive balance deterioration Referring Physician: Dr. [name] is an 65 y.o. female, currently 9 weeks s/p a C4/5 ACDF and C6 corpectomy. Patient was recently hospitalized due to progressive falls and worsening balance. Patient also c/o pain in the back and neck. Also reports having to use bathroom at an increasing frequency. She feels unable to walk independently. She states that she was deteriorating prior to her surgery on 11/06/2015, and that her deterioration continued after surgery, particularly at about 2 weeks after surgery. Her fine motor skills have also progressively deteriorated. She reports progressive weakness in her b/l hands and legs as well, L > R. Presently she is dependent on a walker to get to the restroom.   Past Medical History:  Diagnosis Date  . Abnormal stress test 01/03/2013   false positive by Dr. Gwenlyn Found  . Allergy   . Arthritis    degenerative lumbar spine   . Asthma   . CAD (coronary artery disease) 01/30/2013   single vessel disease of small second diagonal branch, normal EF 65%  . COPD (chronic obstructive pulmonary disease) ([place])    reviewed by Dr. Melvyn Novas  . Depression   . Emphysema lung ([place])   . Fall 01/13/2016  . H/O cardiovascular stress test 2014  . Hyperlipidemia   . Hypertension   . Neuropathy ([place])   . Right bundle branch block   . Tobacco use    has tried to stop numerous times    Past Surgical History:  Procedure Laterality Date  . ABDOMINAL HYSTERECTOMY  1978  . ANTERIOR CERVICAL DECOMP/DISCECTOMY FUSION N/A 11/06/2015   Procedure: ANTERIOR CERVICAL DISCECTOMY  C4-5, C6 CORPECTOMY, FUSION C4-7;  Surgeon: Melina Schools, MD;  Location: [place] [facility];  Service: Orthopedics;  Laterality: N/A;  . BACK SURGERY     12-2014   DR. BROOKS  . CARDIAC CATHETERIZATION  01/30/2013   2nd small diag branch with 95% stentosis, too small for intervention.  No disease in other vessels.  [facility] Kitchen EYE SURGERY     cataracts removed, /w IOL-  bilateral, yag laser  . FOOT SURGERY     for plantar fascitis BILAT  . HAND SURGERY Left    2015  . LEFT HEART CATHETERIZATION WITH CORONARY ANGIOGRAM N/A 01/30/2013   Procedure: LEFT HEART CATHETERIZATION WITH CORONARY ANGIOGRAM;  Surgeon: Lorretta Harp, MD;  Location: [facility] CATH LAB;  Service: Cardiovascular;  Laterality: N/A;  . TUBAL LIGATION      Family History  Problem Relation Age of Onset  . Coronary artery disease Mother   . Alzheimer's disease Mother   . Rheum arthritis Mother   . Arthritis Mother   . Coronary artery disease Father   . Breast cancer Sister   . Asthma Sister   . Coronary artery disease Sister   . Hypertension Sister   . Arthritis Sister   . Coronary artery disease Maternal Grandfather   . Arthritis Paternal Grandmother   . Coronary artery disease Paternal Grandfather   . Breast cancer Cousin     Social History:  reports that she has been smoking Cigarettes.  She has a 12.50 pack-year smoking history. She has never used smokeless tobacco. She reports that she drinks alcohol. She reports that she does not use drugs.  Allergies: No Known Allergies  Medications: I have reviewed the patient's current medications.  Results for orders placed or performed during the hospital encounter of 01/13/16 (from the past 48 hour(s))  MRSA PCR Screening  Status: None   Collection Time: 01/13/16 10:23 AM  Result Value Ref Range   MRSA by PCR NEGATIVE NEGATIVE    Comment:        The GeneXpert MRSA Assay (FDA approved for NASAL specimens only), is one component of a comprehensive MRSA colonization surveillance program. It is not intended to diagnose MRSA infection nor to guide or monitor treatment for MRSA infections.   Comprehensive metabolic panel     Status: Abnormal   Collection Time: 01/13/16  3:34 PM  Result Value Ref Range   Sodium 140 135 - 145 mmol/L   Potassium 4.4 3.5 - 5.1 mmol/L   Chloride 104 101 - 111 mmol/L   CO2 28 22 - 32 mmol/L    Glucose, Bld 104 (H) 65 - 99 mg/dL   BUN 17 6 - 20 mg/dL   Creatinine, Ser 0.65 0.44 - 1.00 mg/dL   Calcium 10.2 8.9 - 10.3 mg/dL   Total Protein 6.4 (L) 6.5 - 8.1 g/dL   Albumin 3.8 3.5 - 5.0 g/dL   AST 26 15 - 41 U/L   ALT 25 14 - 54 U/L   Alkaline Phosphatase 52 38 - 126 U/L   Total Bilirubin 0.7 0.3 - 1.2 mg/dL   GFR calc non Af Amer >60 >60 mL/min   GFR calc Af Amer >60 >60 mL/min    Comment: (NOTE) The eGFR has been calculated using the CKD EPI equation. This calculation has not been validated in all clinical situations. eGFR's persistently <60 mL/min signify possible Chronic Kidney Disease.    Anion gap 8 5 - 15  CBC     Status: None   Collection Time: 01/13/16  3:34 PM  Result Value Ref Range   WBC 6.6 4.0 - 10.5 K/uL   RBC 4.64 3.87 - 5.11 MIL/uL   Hemoglobin 14.4 12.0 - 15.0 g/dL   HCT 45.0 36.0 - 46.0 %   MCV 97.0 78.0 - 100.0 fL   MCH 31.0 26.0 - 34.0 pg   MCHC 32.0 30.0 - 36.0 g/dL   RDW 14.4 11.5 - 15.5 %   Platelets 323 150 - 400 K/uL  Urinalysis, Routine w reflex microscopic (not at [facility])     Status: None   Collection Time: 01/14/16  1:20 PM  Result Value Ref Range   Color, Urine YELLOW YELLOW   APPearance CLEAR CLEAR   Specific Gravity, Urine 1.018 1.005 - 1.030   pH 6.0 5.0 - 8.0   Glucose, UA NEGATIVE NEGATIVE mg/dL   Hgb urine dipstick NEGATIVE NEGATIVE   Bilirubin Urine NEGATIVE NEGATIVE   Ketones, ur NEGATIVE NEGATIVE mg/dL   Protein, ur NEGATIVE NEGATIVE mg/dL   Nitrite NEGATIVE NEGATIVE   Leukocytes, UA NEGATIVE NEGATIVE    Comment: MICROSCOPIC NOT DONE ON URINES WITH NEGATIVE PROTEIN, BLOOD, LEUKOCYTES, NITRITE, OR GLUCOSE <1000 mg/dL.  Urinalysis, Routine w reflex microscopic (not at [facility])     Status: None   Collection Time: 01/14/16  6:14 PM  Result Value Ref Range   Color, Urine YELLOW YELLOW   APPearance CLEAR CLEAR   Specific Gravity, Urine 1.012 1.005 - 1.030   pH 6.5 5.0 - 8.0   Glucose, UA NEGATIVE NEGATIVE mg/dL   Hgb urine  dipstick NEGATIVE NEGATIVE   Bilirubin Urine NEGATIVE NEGATIVE   Ketones, ur NEGATIVE NEGATIVE mg/dL   Protein, ur NEGATIVE NEGATIVE mg/dL   Nitrite NEGATIVE NEGATIVE   Leukocytes, UA NEGATIVE NEGATIVE    Comment: MICROSCOPIC NOT DONE ON URINES WITH NEGATIVE PROTEIN,  BLOOD, LEUKOCYTES, NITRITE, OR GLUCOSE <1000 mg/dL.    Mr Thoracic Spine Wo Contrast  Result Date: 01/13/2016 CLINICAL DATA:  65 y/o F; 9 weeks postop follow a anterior cervical decompression and fixation of C4 through C7 with concern for cord compression. EXAM: MRI THORACIC SPINE WITHOUT CONTRAST TECHNIQUE: Multiplanar, multisequence MR imaging of the thoracic spine was performed. No intravenous contrast was administered. COMPARISON:  Chest CT dated 10/06/2015. Cervical MRI dated 12/29/2015. FINDINGS: Alignment: Normal thoracic kyphosis. Minimal anterolisthesis at T2-3. Vertebrae: No fracture, evidence of discitis, or bone lesion. Partially visualized on the large field-of-view sagittal T1 weighted sequence there is susceptibility artifact from anterior cervical fusion hardware from C4 through C7. Cord: Stable subcentimeter focus of increased T2 signal within the cervical cord at C7 probably representing myelomalacia. No abnormal signal within the thoracic cord. Paraspinal and other soft tissues: Negative. Disc levels: T8-T9: Small central protrusion without significant foraminal narrowing or canal stenosis. T9-T10: Minimal disc bulge without significant foraminal narrowing or canal stenosis. No significant disc displacement, foraminal stenosis, or canal stenosis is less than noted above. IMPRESSION: 1. Unchanged increased cord signal at the C7 level likely representing myelomalacia. 2. No evidence of cord compression, significant disc displacement, foraminal narrowing, or canal stenosis of the thoracic spine. No abnormal cord signal of the thoracic cord. Electronically Signed   By: Kristine Garbe M.D.   On: 01/13/2016 23:16     ROS Blood pressure (!) 145/77, pulse 75, temperature 97.9 F (36.6 C), temperature source Oral, resp. rate 18, SpO2 98 %. Physical Exam  Constitutional: She is oriented to person, place, and time. She appears well-developed.  HENT:  Head: Normocephalic.  Eyes: Pupils are equal, round, and reactive to light.  Neck: Neck supple.  Respiratory: Effort normal.  GI: Soft.  Neurological: She is alert and oriented to person, place, and time.  Skin: Skin is warm and dry.  Psychiatric: Her behavior is normal.     Of note, patient has 0/5 DF and 1/5 PF strength at L LE, with 4/5 strength at UEs 4/5 grip strength b/l  Patient's postop MRI does reveal continued pressure of left hemicord at lower margin of C6, likely from an osteophyte. Myelomalacia is also noted.     Assessment/Plan:  65 y/o female with worsening myelopathic symptoms s/p ACDF and corpectomy spanning C4-C7. Her situation is clearly a complex one. 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 Per Dr. Rolena Infante op report, there were substantial adhesions between the posterior C6 osteophyte and the dural and he had concerns that additional attempts at additional bone removal would increase the risk of a dural tear or a spinal cord injury. Given this finding, I feel that options are to reattempt an anterior decompression, but the risk of dural tear or SCI would be even high now than at the time of her index procedure, as certainly more scar and more adhesions have developed. I do not feel that a posterior decompression would adequately remove the cord compression. Unfortunately, at this point, the most appropriate course of action I feel is to proceed with a "watch and wait" approach. Again, an additional attempt at an anterior decompression could be considered, but per Dr. Rolena Infante op report and the adhesions noted, the risk of SCI or a significant dural tear is too high at this postoperative  timeframe. The patient does understand my thoughts noted above. All of her questions were answered. I do wish her well.   Tara Cruz 01/14/2016,  6:28 PM

## 2016-01-14 NOTE — Progress Notes (Signed)
    Subjective:     Patient reports pain as 7 on 0-10 scale.  Reports increased leg pain reports back pain   Positive void - frequent Negative bowel movement Positive flatus Negative chest pain or shortness of breath  Objective: Vital signs in last 24 hours: Temp:  [97.5 F (36.4 C)-98.2 F (36.8 C)] 97.5 F (36.4 C) (08/16 1200) Pulse Rate:  [73-88] 73 (08/16 1200) Resp:  [18-20] 18 (08/16 1200) BP: (119-143)/(69-89) 130/89 (08/16 1200) SpO2:  [94 %-98 %] 98 % (08/16 1200)  Intake/Output from previous day: 08/15 0701 - 08/16 0700 In: 240 [P.O.:240] Out: -   Labs:  Recent Labs  01/13/16 1534  WBC 6.6  RBC 4.64  HCT 45.0  PLT 323    Recent Labs  01/13/16 1534  NA 140  K 4.4  CL 104  CO2 28  BUN 17  CREATININE 0.65  GLUCOSE 104*  CALCIUM 10.2   No results for input(s): LABPT, INR in the last 72 hours.  Physical Exam: ABD soft Intact pulses distally Compartment soft  Pain control poor with oral meds Remains unsteady when standing Neuro exam - no significant change.  Remains globally weak in LE (bilaterally)  Thoracic MRI reviewed:  No acute findings in thoracic spine.  Area of cord signal change noted at C6/7 Assessment/Plan:  Patient with increased urinary frequency.  Will place foley tonight and send for UA.  Previous specimen was clean catch - concern about contamination. Start IV morphine for pain control as oral meds not effectively controlling pain Will re-evaluate in AM May need to start abx if repeat UA is positive   Tara Lickahari Shawnelle Spoerl, MD [facility] [phone]\

## 2016-01-14 NOTE — Evaluation (Signed)
Physical Therapy Evaluation Patient Details Name: Tara Bottcheratricia A Cruz MRN: [id number] DOB: March 02, 1951 Today's Date: 01/14/2016   History of Present Illness  Pt admitted today because of history of falls, and difficulty at home. pt is s/p ANTERIOR CERVICAL DISCECTOMY  C4-5, C6 CORPECTOMY, FUSION C4-7 on 11/06/2015. PMH included CAD, HTN, hyperlipidemia, RBBB, asthma, COPD, emphysema. Current smoker. BMI 25. S/p L3-4 decompression and posterior spinal fusion 01/15/15   Clinical Impression  Patient presents with generalized weakness, drop foot on left, decreased core strength/stability, balance deficits and impaired mobility s/p above surgeries. Pt has been falling a lot at home and not able to wear AFO. Pt's spouse is not able to provide necessary assist at home. Pt has been doing HHPT but has been getting progressively worse with mobility/function. Would be a great rehab candidate. Would benefit from CIR to maximize independence, mobility and decrease fall risk prior to return home.     Follow Up Recommendations CIR    Equipment Recommendations  Other (comment) (TBA)    Recommendations for Other Services OT consult;Rehab consult     Precautions / Restrictions Precautions Precautions: Fall Precaution Comments: cervical Required Braces or Orthoses: Other Brace/Splint Other Brace/Splint: pt reports she has AFO brace at home but hasn't been able to use it since she has not found a pair of shoes that work well with it. Pt reports brace and shoe were rubbing top of her foot Restrictions Weight Bearing Restrictions: No      Mobility  Bed Mobility               General bed mobility comments: in chair on arrival  Transfers Overall transfer level: Needs assistance Equipment used: Rolling walker (2 wheeled) Transfers: Sit to/from Stand Sit to Stand: Min assist         General transfer comment: Assist to boost from chair with cues for technique. Focused on positioning of core sitting  in chair facilitating thoracic/lumbar extension and anterior pelvic tilt prior to standing.  Ambulation/Gait Ambulation/Gait assistance: Min assist Ambulation Distance (Feet): 15 Feet Assistive device: Rolling walker (2 wheeled) Gait Pattern/deviations: Step-through pattern;Step-to pattern;Decreased stride length;Decreased dorsiflexion - left;Trunk flexed   Gait velocity interpretation: Below normal speed for age/gender General Gait Details: Uncontrolled descent of LLE during swing phase with foot drop noted;  knee hyperextension thrust left knee during stance due to weakness. DOE. Heavy reliance of UEs as pt with decreased core stability. UNsteady  Stairs            Wheelchair Mobility    Modified Rankin (Stroke Patients Only)       Balance Overall balance assessment: Needs assistance Sitting-balance support: Feet supported;No upper extremity supported Sitting balance-Leahy Scale: Fair Sitting balance - Comments: Focused on activating core musculature facilitating thoracic/lumbar extension with anterior pelvic tilt without using UEs for support. Able to hold correct posture for <30 sec, cues for breathing.   Standing balance support: During functional activity Standing balance-Leahy Scale: Poor                               Pertinent Vitals/Pain Pain Assessment: No/denies pain Pain Intervention(s): Other (comment) (reports fatigue from frequent trips to bathroom)    Home Living Family/patient expects to be discharged to:: Inpatient rehab Living Arrangements: Spouse/significant other Available Help at Discharge: Family;Available PRN/intermittently Type of Home: House Home Access: Stairs to enter Entrance Stairs-Rails: Right;Left Entrance Stairs-Number of Steps: 3 Home Layout: Two level;Able to live on main  level with bedroom/bathroom Home Equipment: Walker - 2 wheels;Cane - single point;Adaptive equipment;Bedside commode;Shower seat      Prior Function  Level of Independence: Independent               Hand Dominance   Dominant Hand: Right    Extremity/Trunk Assessment   Upper Extremity Assessment: Defer to OT evaluation           Lower Extremity Assessment: Generalized weakness;RLE deficits/detail;LLE deficits/detail RLE Deficits / Details: 3/5 knee extension, 2+/5 knee flexion, 3/5 hip flexion. LLE Deficits / Details: 0/5 DF, 1/5 PF; 3/5 knee extension, 2+/5 knee flexion, 2+/5 hip flexion.  Cervical / Trunk Assessment: Kyphotic  Communication   Communication: No difficulties  Cognition Arousal/Alertness: Awake/alert Behavior During Therapy: WFL for tasks assessed/performed Overall Cognitive Status: Within Functional Limits for tasks assessed                      General Comments      Exercises        Assessment/Plan    PT Assessment Patient needs continued PT services  PT Diagnosis Difficulty walking;Generalized weakness;Abnormality of gait   PT Problem List Decreased strength;Decreased mobility;Decreased activity tolerance;Decreased range of motion;Cardiopulmonary status limiting activity;Decreased balance  PT Treatment Interventions Therapeutic activities;Gait training;Therapeutic exercise;Patient/family education;Stair training;Balance training;Functional mobility training;Neuromuscular re-education;Wheelchair mobility training;DME instruction   PT Goals (Current goals can be found in the Care Plan section) Acute Rehab PT Goals Patient Stated Goal: to be able to walk again PT Goal Formulation: With patient Time For Goal Achievement: 01/28/16 Potential to Achieve Goals: Good    Frequency Min 5X/week   Barriers to discharge Decreased caregiver support;Inaccessible home environment      Co-evaluation               End of Session Equipment Utilized During Treatment: Gait belt Activity Tolerance: Patient limited by fatigue;Patient tolerated treatment well Patient left: in chair;with call  bell/phone within reach Nurse Communication: Mobility status         Time: 0936-1000 PT Time Calculation (min) (ACUTE ONLY): 24 min   Charges:   PT Evaluation $PT Eval Moderate Complexity: 1 Procedure PT Treatments $Gait Training: 8-22 mins   PT G Codes:        Saladin Petrelli A Tay Whitwell 01/14/2016, 11:21 AM Mylo RedShauna Ulyses Panico, PT, DPT [phone]

## 2016-01-14 NOTE — Progress Notes (Signed)
Occupational Therapy Treatment Patient Details Name: Ledora Bottcheratricia A Shadoan MRN: [id number] DOB: [birth year] Today's Date: 01/14/2016    History of present illness Pt admitted today because of history of falls, and difficulty at home. pt is s/p ANTERIOR CERVICAL DISCECTOMY C4-5, C6 CORPECTOMY, FUSION C4-7 on 11/06/2015. PMH included CAD, HTN, hyperlipidemia, RBBB, asthma, COPD, emphysema. Current smoker. BMI 25. S/p L3-4 decompression and posterior spinal fusion 01/15/15    OT comments  Pt demonstrates balance deficits and multiple falls PTA ( 3 within last 3 days). Pt is unsafe to d/c home alone. Pt requires heavy use of BIL UE for support and reporting incr urgency to void bladder. Recommend CIR for d/c planning.    Follow Up Recommendations  CIR    Equipment Recommendations  Other (comment) (defer to CIR)    Recommendations for Other Services Rehab consult    Precautions / Restrictions Precautions Precautions: Fall Precaution Comments: cervical Required Braces or Orthoses: Other Brace/Splint Other Brace/Splint: pt reports he has AFO brace at home but hasn't been able to use it since she has not found a pair of shoes that work well with it. Pt reports brace and shoe were rubbing top of her foot Restrictions Weight Bearing Restrictions: No       Mobility Bed Mobility               General bed mobility comments: in chair no arrival  Transfers Overall transfer level: Needs assistance Equipment used: Rolling walker (2 wheeled) Transfers: Sit to/from Stand Sit to Stand: Min assist         General transfer comment: cues for safety with RW    Balance Overall balance assessment: Needs assistance Sitting-balance support: Bilateral upper extremity supported;Feet supported   Sitting balance - Comments: pt BIL UE in static sitting requires. pt with lateral lean to place item in trash and LOB with single UE support   Standing balance support: Bilateral upper extremity  supported;During functional activity Standing balance-Leahy Scale: Poor                     ADL Overall ADL's : Needs assistance/impaired Eating/Feeding: Set up   Grooming: Minimal assistance;Wash/dry hands;Standing Grooming Details (indicate cue type and reason): pt requires bil UE on elbows on sink level for balance. pt reports balance is the reason for leaning more than pain Upper Body Bathing: Minimal assitance   Lower Body Bathing: Maximal assistance   Upper Body Dressing : Minimal assistance   Lower Body Dressing: Maximal assistance   Toilet Transfer: Minimal assistance Toilet Transfer Details (indicate cue type and reason): cues to have bil LE touch toilet prior to attempting to sit. Cues to reach back with hands         Functional mobility during ADLs: Minimal assistance;Rolling walker General ADL Comments: pt reports 12 trips to the bathroom over night ( every 45 minutes.) pt reports this all started with the surgery and progressive weakness. pt reports "its getting worse" RN made aware. pt fatigued due to lack of sleep and urgency to void. pt with L foot dragging with transfer. Pt could benefit from toe lift at this time to decr fall until proper foot wear and be obtained. Pt reports 8-10 falls and 3 in 3 days      Vision                     Perception     Praxis      Cognition   Behavior During  Therapy: WFL for tasks assessed/performed Overall Cognitive Status: Within Functional Limits for tasks assessed                       Extremity/Trunk Assessment               Exercises     Shoulder Instructions       General Comments      Pertinent Vitals/ Pain       Pain Intervention(s): Other (comment) (reports fatigue from frequent trips to bathroom)  Home Living                                          Prior Functioning/Environment              Frequency Min 2X/week     Progress Toward Goals  OT  Goals(current goals can now be found in the care plan section)  Progress towards OT goals: Progressing toward goals  Acute Rehab OT Goals Patient Stated Goal: to be able to go to rehab so I can walk again without falling OT Goal Formulation: With patient Time For Goal Achievement: 01/20/16 Potential to Achieve Goals: Good ADL Goals Pt Will Perform Grooming: with set-up;with supervision;standing Pt Will Perform Lower Body Bathing: with set-up;with supervision;sit to/from stand Pt Will Perform Lower Body Dressing: with set-up;with supervision;sit to/from stand Pt Will Transfer to Toilet: with supervision;ambulating;bedside commode Pt Will Perform Toileting - Clothing Manipulation and hygiene: with supervision;sit to/from stand Pt Will Perform Tub/Shower Transfer: Tub transfer;with min guard assist;ambulating;rolling walker Additional ADL Goal #1: Pt will be Mod I in and out of bed for basic ADLs with HOB down and no rail  Plan Discharge plan remains appropriate    Co-evaluation                 End of Session Equipment Utilized During Treatment: Gait belt;Rolling walker   Activity Tolerance Patient tolerated treatment well   Patient Left in chair;with call bell/phone within reach   Nurse Communication Mobility status;Precautions        Time: [id number] OT Time Calculation (min): 15 min  Charges: OT General Charges $OT Visit: 1 Procedure OT Treatments $Self Care/Home Management : 8-22 mins  Boone MasterJones, Caniyah Murley B 01/14/2016, 10:02 AM   Mateo FlowJones, Brynn   OTR/L Pager: [phone] Office: [phone] .

## 2016-01-14 NOTE — Progress Notes (Addendum)
Pt c/o of urinary frequency. Pt has been ambulating to the bathroom every hour. Urine is amber colored with no odor. Pt denies any discomfort with voiding. Dr. Shon BatonBrooks notified and orders given. Will continue to monitor Pt. Tara FendtAshley Berthe Oley, RN

## 2016-01-15 LAB — URINE CULTURE: CULTURE: NO GROWTH

## 2016-01-15 MED ORDER — LUBIPROSTONE 24 MCG PO CAPS
24.0000 ug | ORAL_CAPSULE | Freq: Two times a day (BID) | ORAL | Status: DC
  Administered 2016-01-15 – 2016-01-16 (×3): 24 ug via ORAL
  Filled 2016-01-15 (×4): qty 1

## 2016-01-15 NOTE — NC FL2 (Signed)
[place] MEDICAID FL2 LEVEL OF CARE SCREENING TOOL     IDENTIFICATION  Patient Name: Tara Cruz Birthdate: 06/24/1950 Sex: female Admission Date (Current Location): 01/13/2016  [facility] [place]   Facility and Address:  [facility], [address], [place], [place] [address]      Provider Number: [id number]  Attending Physician Name and Address:  Venita Lickahari Brooks, MD  Relative Name and Phone Number:       Current Level of Care: Hospital Recommended Level of Care: Skilled Nursing Facility Prior Approval Number:    Date Approved/Denied:   PASRR Number: [id number]  Discharge Plan: SNF    Current Diagnoses: Patient Active Problem List   Diagnosis Date Noted  . Leg weakness 01/13/2016  . Neck pain 11/06/2015  . Spondylosis, cervical, with myelopathy 10/31/2015  . Back pain 01/15/2015  . Chest pain, abnormal stress test, cardiac cath with single vessel diag disease, too small to intervene. 02/06/2013  . CAD (coronary artery disease) 01/30/2013  . Right bundle branch block 01/19/2013  . Hip pain, bilateral 12/11/2010  . BARTHOLIN'S CYST, RECURRENT 11/27/2008  . ADJUSTMENT DISORDER WITH DEPRESSED MOOD 09/27/2008  . OVERACTIVE BLADDER 09/27/2008  . ASTHMA, WITH ACUTE EXACERBATION 09/26/2008  . DYSPNEA 09/26/2008  . DEGENERATIVE JOINT DISEASE, CERVICAL SPINE 02/27/2008  . Smoker 08/17/2007  . Essential hypertension 08/17/2007  . Dyslipidemia 02/25/2007  . Allergic rhinitis 02/25/2007  . Asthma 02/25/2007  . COPD GOLD I, still smoking  02/25/2007    Orientation RESPIRATION BLADDER Height & Weight     Self, Time, Situation, Place    Continent Weight:   Height:     BEHAVIORAL SYMPTOMS/MOOD NEUROLOGICAL BOWEL NUTRITION STATUS      Continent    AMBULATORY STATUS COMMUNICATION OF NEEDS Skin   Limited Assist Verbally Normal                       Personal Care Assistance Level of Assistance  Dressing, Bathing  Bathing Assistance: Limited assistance   Dressing Assistance: Limited assistance     Functional Limitations Info             SPECIAL CARE FACTORS FREQUENCY  PT (By licensed PT), OT (By licensed OT)     PT Frequency: daily OT Frequency: daily            Contractures Contractures Info: Not present    Additional Factors Info                  Current Medications (01/15/2016):  This is the current hospital active medication list Current Facility-Administered Medications  Medication Dose Route Frequency Provider Last Rate Last Dose  . acetaminophen (TYLENOL) tablet 650 mg  650 mg Oral Q6H PRN [facility], PA-C       Or  . acetaminophen (TYLENOL) suppository 650 mg  650 mg Rectal Q6H PRN Carmen Christina Mayo, PA-C      . albuterol (PROVENTIL) (2.5 MG/3ML) 0.083% nebulizer solution 3 mL  3 mL Inhalation Q4H PRN Venita Lickahari Brooks, MD      . aspirin EC tablet 325 mg  325 mg Oral Daily [facility]   325 mg at 01/15/16 0913  . celecoxib (CELEBREX) capsule 200 mg  200 mg Oral Daily Venita Lickahari Brooks, MD   200 mg at 01/15/16 0910  . docusate sodium (COLACE) capsule 100 mg  100 mg Oral BID [facility] Mayo, PA-C   100 mg at 01/15/16 0910  .  estradiol (ESTRACE) tablet 0.5 mg  0.5 mg Oral Daily Venita Lickahari Brooks, MD   0.5 mg at 01/15/16 0910  . ezetimibe-simvastatin (VYTORIN) 10-20 MG per tablet 1 tablet  1 tablet Oral Daily Venita Lickahari Brooks, MD   1 tablet at 01/15/16 0910  . feeding supplement (ENSURE ENLIVE) (ENSURE ENLIVE) liquid 237 mL  237 mL Oral BID BM Venita Lickahari Brooks, MD   237 mL at 01/15/16 0913  . lubiprostone (AMITIZA) capsule 24 mcg  24 mcg Oral BID [facility]   24 mcg at 01/15/16 0909  . mometasone-formoterol (DULERA) 200-5 MCG/ACT inhaler 2 puff  2 puff Inhalation BID Venita Lickahari Brooks, MD   2 puff at 01/15/16 0808  . morphine 2 MG/ML injection 2 mg  2 mg Intravenous Q2H PRN Venita Lickahari Brooks, MD   2 mg at 01/15/16 0909  . ondansetron (ZOFRAN)  tablet 4 mg  4 mg Oral Q6H PRN [facility], [facility]       Or  . ondansetron [facility](ZOFRAN) injection 4 mg  4 mg Intravenous Q6H PRN [facility], PA-C      . oxyCODONE (Oxy IR/ROXICODONE) immediate release tablet 5 mg  5 mg Oral Q4H PRN [facility], [facility]   5 mg at 01/14/16 1626  . polyethylene glycol (MIRALAX / GLYCOLAX) packet 17 g  17 g Oral Daily PRN [facility] Mayo, PA-C      . zolpidem (AMBIEN) tablet 5 mg  5 mg Oral QHS PRN Venita Lickahari Brooks, MD   5 mg at 01/13/16 2254     Discharge Medications: Please see discharge summary for a list of discharge medications.  Relevant Imaging Results:  Relevant Lab Results:   Additional Information SSN: [id number]  Rondel Batonngle, Jerzey Komperda C, LCSW

## 2016-01-15 NOTE — Progress Notes (Signed)
Patient ID: Tara Cruz, female   DOB: 03/28/1951, 65 y.o.   MRN: [id number]    Subjective:    Patient reports pain as 5 on 0-10 scale.   Denies CP or SOB.  Voiding without difficulty. Positive flatus. Objective: Vital signs in last 24 hours: Temp:  [97.5 F (36.4 C)-98.5 F (36.9 C)] 98.4 F (36.9 C) (08/17 0350) Pulse Rate:  [73-86] 86 (08/17 0350) Resp:  [16-20] 20 (08/17 0350) BP: (119-145)/(70-89) 128/73 (08/17 0350) SpO2:  [97 %-98 %] 97 % (08/17 0350)  Intake/Output from previous day: 08/16 0701 - 08/17 0700 In: 240 [P.O.:240] Out: 1000 [Urine:1000] Intake/Output this shift: No intake/output data recorded.  Labs:  Recent Labs  01/13/16 1534  HGB 14.4    Recent Labs  01/13/16 1534  WBC 6.6  RBC 4.64  HCT 45.0  PLT 323    Recent Labs  01/13/16 1534  NA 140  K 4.4  CL 104  CO2 28  BUN 17  CREATININE 0.65  GLUCOSE 104*  CALCIUM 10.2   No results for input(s): LABPT, INR in the last 72 hours.  Physical Exam: Neurologically intact ABD soft Sensation intact distally Compartment soft LE weakness Left >right Assessment/Plan:    Advance diet Up with therapy Working on SNF placement  Ari Bernabei, Baxter Kailarmen Christina for Dr. Venita Lickahari Brooks [facility] [phone] 01/15/2016, 7:41 AM

## 2016-01-15 NOTE — Progress Notes (Signed)
Physical Therapy Treatment Patient Details Name: Tara Bottcheratricia A Leugers MRN: [id number] DOB: Apr 22, 1951 Today's Date: 01/15/2016    History of Present Illness Pt admitted today because of history of falls, and difficulty at home. pt is s/p ANTERIOR CERVICAL DISCECTOMY  C4-5, C6 CORPECTOMY, FUSION C4-7 on 11/06/2015. PMH included CAD, HTN, hyperlipidemia, RBBB, asthma, COPD, emphysema. Current smoker. BMI 25. S/p L3-4 decompression and posterior spinal fusion 01/15/15     PT Comments    Patient emotional after meeting with MD this morning for a second opinion. Eager to work with therapy. Tolerated gait training with Min A for balance/safety. Focused on activation and facilitation of core musculature sitting in chair to assist with trunk stability during standing and ambulation. Pt highly motivated. Not safe to return home. Will follow.   Follow Up Recommendations  CIR     Equipment Recommendations  Other (comment) (TBA)    Recommendations for Other Services       Precautions / Restrictions Precautions Precautions: Fall Precaution Comments: cervical Required Braces or Orthoses: Other Brace/Splint Other Brace/Splint: Brought AFO from home but not able to use it. Restrictions Weight Bearing Restrictions: No    Mobility  Bed Mobility               General bed mobility comments: in chair on arrival  Transfers Overall transfer level: Needs assistance Equipment used: Rolling walker (2 wheeled) Transfers: Sit to/from Stand Sit to Stand: Min guard         General transfer comment: Min guard for safety. Cues for technique. Focused on positioning of core sitting in chair to facilitate thoracic/lumbar extension and anterior pelvic tilt prior to standing. Stood from chair x4.  Ambulation/Gait Ambulation/Gait assistance: Min assist Ambulation Distance (Feet): 20 Feet (+ 20' + 30') Assistive device: Rolling walker (2 wheeled) Gait Pattern/deviations: Step-through pattern;Decreased  stride length;Decreased dorsiflexion - left;Trunk flexed   Gait velocity interpretation: Below normal speed for age/gender General Gait Details: Uncontrolled descent of LLE during swing phase with foot drop noted; left knee hyperextension during stance due to weakness. Heavy reliance of UEs as pt with decreased core stability. UNsteady. 3 seated rest breaks.   Stairs            Wheelchair Mobility    Modified Rankin (Stroke Patients Only)       Balance Overall balance assessment: Needs assistance Sitting-balance support: Feet supported;No upper extremity supported Sitting balance-Leahy Scale: Fair Sitting balance - Comments: Focused on activating core musculature facilitating thoracic/lumbar extension with anterior pelvic tilt without using UEs for support. Able to hold correct posture for 40 sec, ~30 sec; manual cues for facilitation.    Standing balance support: During functional activity Standing balance-Leahy Scale: Poor                      Cognition Arousal/Alertness: Awake/alert Behavior During Therapy: WFL for tasks assessed/performed (emotional after meeting with MD.) Overall Cognitive Status: Within Functional Limits for tasks assessed                      Exercises Other Exercises Other Exercises: Core exercises- correct positioning in chair with thoracic/lumbar extension, and activating TA- mini marches x2    General Comments        Pertinent Vitals/Pain Pain Assessment: No/denies pain    Home Living                      Prior Function  PT Goals (current goals can now be found in the care plan section) Progress towards PT goals: Progressing toward goals    Frequency  Min 5X/week    PT Plan Current plan remains appropriate    Co-evaluation             End of Session Equipment Utilized During Treatment: Gait belt Activity Tolerance: Patient limited by fatigue;Patient tolerated treatment well Patient  left: in chair;with call bell/phone within reach     Time: 0816-0842 PT Time Calculation (min) (ACUTE ONLY): 26 min  Charges:  $Gait Training: 8-22 mins $Therapeutic Exercise: 8-22 mins                    G Codes:      Randal Yepiz A Brannon Decaire 01/15/2016, 8:55 AM Mylo RedShauna Theodosia Bahena, PT, DPT [phone]

## 2016-01-16 MED ORDER — ONDANSETRON HCL 4 MG PO TABS: 4.0000 mg | ORAL_TABLET | Freq: Three times a day (TID) | ORAL | 0 refills | Status: DC | PRN

## 2016-01-16 MED ORDER — OXYCODONE-ACETAMINOPHEN 10-325 MG PO TABS: 1.0000 | ORAL_TABLET | ORAL | 0 refills | Status: DC | PRN

## 2016-01-16 MED ORDER — METHOCARBAMOL 500 MG PO TABS: 500.0000 mg | ORAL_TABLET | Freq: Three times a day (TID) | ORAL | 0 refills | Status: DC | PRN

## 2016-01-16 NOTE — Progress Notes (Signed)
Patient alert and oriented, mae's well, voiding adequate amount of urine, swallowing without difficulty, c/o discomfort. Patient discharged to facility with transporter. Script and discharged instructions given to patient. Patient and family stated understanding of instructions given.

## 2016-01-16 NOTE — Discharge Summary (Signed)
Patient ID: Tara Cruz MRN: [id number] DOB/AGE: 67-Mar-1952 67 y.o.  Admit date: 01/13/2016 Discharge date: 01/16/2016  Admission Diagnoses:  Active Problems:   Leg weakness Myelopathy  Discharge Diagnoses:  Active Problems:   Leg weakness  status post cervical decompression and fusion  Past Medical History:  Diagnosis Date  . Abnormal stress test 01/03/2013   false positive by Dr. Allyson Sabal  . Allergy   . Arthritis    degenerative lumbar spine   . Asthma   . CAD (coronary artery disease) 01/30/2013   single vessel disease of small second diagonal branch, normal EF 65%  . COPD (chronic obstructive pulmonary disease) (HCC)    reviewed by Dr. Sherene Sires  . Depression   . Emphysema lung (HCC)   . Fall 01/13/2016  . H/O cardiovascular stress test 2014  . Hyperlipidemia   . Hypertension   . Neuropathy (HCC)   . Right bundle branch block   . Tobacco use    has tried to stop numerous times    Surgeries:  Not during this admission   Consultants: Dr Yevette Edwards  Discharged Condition: stable  Hospital Course: Tara Cruz is an 65 y.o. female who was admitted 01/13/2016 for operative treatment of LBP, difficulty ambulating, unsteady gait. Patient failed conservative treatments (please see the history and physical for the specifics) and had severe unremitting pain that affects sleep, daily activities and work/hobbies. After pre-op clearance, the patient was taken to the operating room on  and underwent.  Patient was admitted for pain control and further evaluation. Thoracic MRI was done which did not show any area of concern for cord compression. Clinical exam consistent with myelopathy.  Proximal LE muscle weakness, unsteady gait.  No UE weakness noted. IV pain medication started to improve pain control.  Dr Yevette Edwards asked to prove second opinion concerning additional surgery.  Agree that revision anterior decompression would be needed to address the anterior spur causing  ongoing cord compression.  However, as I indicated to patient at time of index procedure I could not remove the spur because I feared causing a serious spinal cord injury and/or extensive dural tear.  As stated by Dr Yevette Edwards and myself the risk has increased given the fact that there is now scar tissue from original surgery.  Recommend observation with possibility of improvement.  Patient agreed with plan.  Pain was eventual controlled and she was able to mobilize with therapy.  UA was thought to be positive due to frequency but final labs demonstrated no sign of infection.  Agree with PT recommendations that patient is significant fall risk and needs full time care at this time.  Therefore will move forward with SNF dicharge  Patient was given perioperative antibiotics: Anti-infectives    None       Patient was given sequential compression devices and early ambulation to prevent DVT.   Patient benefited maximally from hospital stay and there were no complications. At the time of discharge, the patient was urinating/moving their bowels without difficulty, tolerating a regular diet, pain is controlled with oral pain medications.  Recent vital signs: Patient Vitals for the past 24 hrs:  BP Temp Temp src Pulse Resp SpO2  01/16/16 0315 115/74 97.6 F (36.4 C) Oral 82 18 94 %  01/15/16 2254 (!) 141/72 98.2 F (36.8 C) Oral 82 18 100 %  01/15/16 2104 122/77 98.3 F (36.8 C) Oral 78 18 97 %  01/15/16 2042 - - - - - 98 %  01/15/16 1630  114/76 98.2 F (36.8 C) - 76 18 94 %  01/15/16 1153 113/63 98.5 F (36.9 C) - 90 18 99 %     Recent laboratory studies:  Recent Labs  01/13/16 1534  WBC 6.6  HGB 14.4  HCT 45.0  PLT 323  NA 140  K 4.4  CL 104  CO2 28  BUN 17  CREATININE 0.65  GLUCOSE 104*  CALCIUM 10.2     Discharge Medications:     Medication List    STOP taking these medications   TYLENOL ARTHRITIS PAIN 650 MG CR tablet Generic drug:  acetaminophen     TAKE these  medications   albuterol 108 (90 Base) MCG/ACT inhaler Commonly known as:  PROVENTIL HFA;VENTOLIN HFA Inhale 1 puff into the lungs every 4 (four) hours as needed for wheezing or shortness of breath.   aspirin EC 81 MG tablet Take 81 mg by mouth daily.   CALCIUM + D3 600-800 MG-UNIT Tabs Generic drug:  Calcium Carb-Cholecalciferol Take 1 tablet by mouth daily.   celecoxib 200 MG capsule Commonly known as:  CELEBREX Take 200 mg by mouth daily.   docusate sodium 100 MG capsule Commonly known as:  COLACE Take 1 capsule (100 mg total) by mouth 3 (three) times daily as needed for mild constipation.   estradiol 0.5 MG tablet Commonly known as:  ESTRACE Take 0.5 mg by mouth daily.   ezetimibe-simvastatin 10-20 MG tablet Commonly known as:  VYTORIN Take 1 tablet by mouth daily.   Fluticasone-Salmeterol 250-50 MCG/DOSE Aepb Commonly known as:  ADVAIR Inhale 1 puff into the lungs 2 (two) times daily as needed (For shortness of breath.).   methocarbamol 500 MG tablet Commonly known as:  ROBAXIN Take 1 tablet (500 mg total) by mouth 3 (three) times daily as needed for muscle spasms.   ondansetron 4 MG tablet Commonly known as:  ZOFRAN Take 1 tablet (4 mg total) by mouth every 8 (eight) hours as needed for nausea or vomiting.   oxyCODONE-acetaminophen 10-325 MG tablet Commonly known as:  PERCOCET Take 1 tablet by mouth every 4 (four) hours as needed for pain.   simvastatin 40 MG tablet Commonly known as:  ZOCOR Take 1 tablet (40 mg total) by mouth at bedtime.   zolpidem 5 MG tablet Commonly known as:  AMBIEN Take 5 mg by mouth at bedtime as needed for sleep.       Diagnostic Studies: Mr Thoracic Spine Wo Contrast  Result Date: 01/13/2016 CLINICAL DATA:  65 y/o F; 9 weeks postop follow a anterior cervical decompression and fixation of C4 through C7 with concern for cord compression. EXAM: MRI THORACIC SPINE WITHOUT CONTRAST TECHNIQUE: Multiplanar, multisequence MR imaging of  the thoracic spine was performed. No intravenous contrast was administered. COMPARISON:  Chest CT dated 10/06/2015. Cervical MRI dated 12/29/2015. FINDINGS: Alignment: Normal thoracic kyphosis. Minimal anterolisthesis at T2-3. Vertebrae: No fracture, evidence of discitis, or bone lesion. Partially visualized on the large field-of-view sagittal T1 weighted sequence there is susceptibility artifact from anterior cervical fusion hardware from C4 through C7. Cord: Stable subcentimeter focus of increased T2 signal within the cervical cord at C7 probably representing myelomalacia. No abnormal signal within the thoracic cord. Paraspinal and other soft tissues: Negative. Disc levels: T8-T9: Small central protrusion without significant foraminal narrowing or canal stenosis. T9-T10: Minimal disc bulge without significant foraminal narrowing or canal stenosis. No significant disc displacement, foraminal stenosis, or canal stenosis is less than noted above. IMPRESSION: 1. Unchanged increased cord signal at the  C7 level likely representing myelomalacia. 2. No evidence of cord compression, significant disc displacement, foraminal narrowing, or canal stenosis of the thoracic spine. No abnormal cord signal of the thoracic cord. Electronically Signed   By: Mitzi HansenLance  Furusawa-Stratton M.D.   On: 01/13/2016 23:16   Mr Cervical Spine W Wo Contrast  Result Date: 12/29/2015 CLINICAL DATA:  Six weeks follow-up from cervical corpectomy, discectomy and fusion. Worsening of pain postoperatively with weakness in the legs EXAM: MRI CERVICAL SPINE WITHOUT AND WITH CONTRAST TECHNIQUE: Multiplanar and multiecho pulse sequences of the cervical spine, to include the craniocervical junction and cervicothoracic junction, were obtained according to standard protocol without and with intravenous contrast. CONTRAST:  15 cc MultiHance COMPARISON:  Postsurgical radiographs 11/06/2015. No preoperative MRI available. FINDINGS: Foramen magnum is normal.  Mild osteoarthritis at the C1-2 articulation. No encroachment upon the neural structures. C2-3: Facet arthropathy right worse than left. Mild bulging of the disc. Foraminal narrowing right more than left. C3-4: Bilateral facet arthropathy with 1 or 2 mm of anterolisthesis. Mild bulging of the disc. No central canal stenosis. Foraminal narrowing left more than right. C4 through C7: Previous anterior cervical discectomy and fusion with C6 corpectomy. Considerable artifact from the fusion hardware. However, there appears to be some continued posterior protrusion of bone at the inferior C6 level, effacing the ventral subarachnoid space and indenting the cord. There is abnormal T2 signal in the cord immediately below that consistent with gliosis. C7-T1: Mild facet degeneration on the left. No canal or foraminal stenosis. IMPRESSION: Corpectomy at C6 with ACDF from C4 through C7. Considerable artifact from the fusion hardware. 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 Abnormal T2 signal in the cord immediately distal to that consistent with gliosis. Facet arthropathy at C2-3 and C3-4 with neural foraminal stenosis that could be symptomatic. Electronically Signed   By: Paulina FusiMark  Shogry M.D.   On: 12/29/2015 15:27   Mr Lumbar Spine W Wo Contrast  Result Date: 12/29/2015 CLINICAL DATA:  Status post lumbar fusion. Worsening pain with weakness. Cervical discectomy and fusions 6 weeks prior. Small remote history of lumbar spine surgery. EXAM: MRI LUMBAR SPINE WITHOUT AND WITH CONTRAST TECHNIQUE: Multiplanar and multiecho pulse sequences of the lumbar spine were obtained without and with intravenous contrast. CONTRAST:  15 cc MultiHance intravenous COMPARISON:  Radiography 01/16/2015 FINDINGS: Segmentation: Based on small inferior ribs 01/16/2015 there are 5 lumbar type vertebral bodies. Alignment:  Mild L3-4 anterolisthesis. Vertebrae:  No fracture, evidence  of discitis, or bone lesion. Conus medullaris: Extends to the L1 level and appears normal. No signs of arachnoiditis. Paraspinal and other soft tissues: Expanded fluid intensity appearance of the bilateral renal pelvises from renal sinus cysts. Postoperative scarring and fatty atrophy in the lower intrinsic back muscles. Disc levels: T12- L1: Unremarkable. L1-L2: Unremarkable. L2-L3: Minor disc narrowing and bulging. Facet hypertrophy and ligament thickening with right-sided joint effusion. No impingement L3-L4: Discectomy. No visible solid bony fusion. Slight anterolisthesis, chronic. Facet arthropathy. Noncompressive epidural fibrosis about the right-sided laminectomy defect and the inferior foramen. No impingement. L4-L5: Mild disc bulging. Facet arthropathy with hypertrophy and small joint fluid. Ventral thecal sac flattening and early subarticular recess narrowing. No suspected impingement. L5-S1:Small central disc protrusion superimposed on narrowing and desiccation. Negative facets. No impingement. IMPRESSION: 1. No acute finding or impingement. 2. L2-3 and L4-5 adjacent segment facet arthropathy with right-sided joint effusion at L2-3. 3. L3-4 discectomy.  No residual or recurrent impingement. 4. L4-5 early  canal stenosis. Electronically Signed   By: Marnee SpringJonathon  Watts M.D.   On: 12/29/2015 15:12        Discharge Plan:  discharge to SNF  Disposition: Recommendations:   Ambulate frequently with assistance and walker                         Place on fall precautions      Oral pain medications: percocet 10/325  1po q4-6 prn pain.  Robaxin 500mg  1 po TID prn       spasms      F/U with me in the office in 4 weeks      If any concerns develop contact office at [phone]    Signed: Venita LickBROOKS,Ashton Belote D for Dr. Venita Lickahari Sharde [facility] [phone] 01/16/2016, 8:25 AM

## 2016-01-16 NOTE — Progress Notes (Signed)
RN attempted to called in report to facility but no response at all. Receptionist tried transferring call to nursing station but no answer. Transporters were given information about patient's arrival to facility with no update info given to facility nurse.

## 2016-01-16 NOTE — Progress Notes (Signed)
CM received a call from Dr Shon BatonBrooks. Patient is medically stable for discharge to SNF today.  Almira CoasterGina, CSW aware.  Elmer Balesourtney Ahmari Duerson RN, MSN [phone]

## 2016-01-16 NOTE — Clinical Social Work Note (Signed)
Clinical Social Work Assessment  Patient Details  Name: Tara Cruz MRN: [id number] Date of Birth: September 08, 1950  Date of referral:  01/16/16               Reason for consult:  Facility Placement                Permission sought to share information with:  Chartered certified accountant granted to share information::  Yes, Verbal Permission Granted  Name::        Agency::   [facility])  Relationship::     Contact Information:     Housing/Transportation Living arrangements for the past 2 months:  Single Family Home Source of Information:  Patient Patient Interpreter Needed:  None Criminal Activity/Legal Involvement Pertinent to Current Situation/Hospitalization:  No - Comment as needed Significant Relationships:  Significant Other Lives with:  Significant Other Do you feel safe going back to the place where you live?  No Need for family participation in patient care:  No (Coment)  Care giving concerns:  No caregivers present at time of discharge   Social Worker assessment / plan:  CSW met with patient at bedside to review discharge plans. Patient has been denied by CIR and is now recommended for SNF.  Patient became very tearful telling this CSW the story of why she was admitted to the hospital.  Patient states she has discs in her neck that are not allowing her full function of her lower extremities.  Patient states there is no cure as of yet and no known cause of her progressive weakness.  CSW offered support as patient became tearful.  Employment status:  Retired Forensic scientist:  Medicare PT Recommendations:  [place] / Referral to community resources:  [place]  Patient/Family's Response to care:  Agreeable to SNF  Patient/Family's Understanding of and Emotional Response to Diagnosis, Current Treatment, and Prognosis:  Patient very tearful when speaking of her circumstances and her prognosis.  CSW offered  support and encouragement.  Patient very appreciative and receptive.    Emotional Assessment Appearance:  Appears stated age Attitude/Demeanor/Rapport:    Affect (typically observed):  Accepting, Adaptable Orientation:  Oriented to Place, Oriented to  Time, Oriented to Situation, Oriented to Self Alcohol / Substance use:  Not Applicable Psych involvement (Current and /or in the community):  No (Comment)  Discharge Needs  Concerns to be addressed:  No discharge needs identified Readmission within the last 30 days:  No Current discharge risk:  None Barriers to Discharge:  No Barriers Identified   Dulcy Fanny, LCSW 01/16/2016, 1:12 PM

## 2016-01-16 NOTE — Discharge Instructions (Signed)
Ok to shower  Call to make follow up appointment Will plan on discharge to skilled nursing facility

## 2016-01-16 NOTE — Clinical Social Work Note (Signed)
Patient will discharge today per MD order. [facility] RN to call report prior to transportation to: [phone] Transportation: [facility] to be scheduled after 12:30 per facility  CSW sent discharge summary to SNF for review.    Vickii PennaGina Shanica Castellanos, LCSW [phone]  5N1-9, 2S 15-16 and Psychiatric Service Line  Licensed Clinical Social Worker

## 2016-01-16 NOTE — Clinical Social Work Placement (Signed)
   CLINICAL SOCIAL WORK PLACEMENT  NOTE  Date:  01/16/2016  Patient Details  Name: Tara Cruz MRN: [id number] Date of Birth: 01/28/1951  Clinical Social Work is seeking post-discharge placement for this patient at the Skilled  Nursing Facility level of care (*CSW will initial, date and re-position this form in  chart as items are completed):  Yes   Patient/family provided with [facility] Clinical Social Work Department's list of facilities offering this level of care within the geographic area requested by the patient (or if unable, by the patient's family).  Yes   Patient/family informed of their freedom to choose among providers that offer the needed level of care, that participate in Medicare, Medicaid or managed care program needed by the patient, have an available bed and are willing to accept the patient.  Yes   Patient/family informed of [facility]'s ownership interest in [facility] and [facility], as well as of the fact that they are under no obligation to receive care at these facilities.  PASRR submitted to EDS on 01/15/16     PASRR number received on 01/15/16     Existing PASRR number confirmed on       FL2 transmitted to all facilities in geographic area requested by pt/family on 01/15/16     FL2 transmitted to all facilities within larger geographic area on       Patient informed that his/her managed care company has contracts with or will negotiate with certain facilities, including the following:        Yes   Patient/family informed of bed offers received.  [facility]     Physician recommends and patient chooses bed at      [facility].  Patient to be transferred to facility by PT[facility]     Patient family notified on 01/16/16 of transfer.  Name of family member notified:  patient is alert and oriented x4.  Patient spoke with husband regarding  discharge plans.     PHYSICIAN Please sign FL2, Please prepare prescriptions     Additional Comment:    _______________________________________________ Rondel BatonIngle, Jakiah Bienaime C, LCSW 01/16/2016, 11:44 AM

## 2016-01-16 NOTE — Progress Notes (Signed)
    Subjective:     Patient reports pain as 3 on 0-10 scale.  Reports none arm pain denies incisional neck pain   Positive void Positive bowel movement Positive flatus Negative chest pain or shortness of breath  Objective: Vital signs in last 24 hours: Temp:  [97.6 F (36.4 C)-98.5 F (36.9 C)] 97.6 F (36.4 C) (08/18 0315) Pulse Rate:  [76-90] 82 (08/18 0315) Resp:  [18] 18 (08/18 0315) BP: (113-141)/(63-77) 115/74 (08/18 0315) SpO2:  [94 %-100 %] 94 % (08/18 0315)  Intake/Output from previous day: No intake/output data recorded.  Labs:  Recent Labs  01/13/16 1534  WBC 6.6  RBC 4.64  HCT 45.0  PLT 323    Recent Labs  01/13/16 1534  NA 140  K 4.4  CL 104  CO2 28  BUN 17  CREATININE 0.65  GLUCOSE 104*  CALCIUM 10.2   No results for input(s): LABPT, INR in the last 72 hours.  Physical Exam: ABD soft Intact pulses distally Neuro exam unchanged Able to ambulate with walker and assistance Significant fall risk  Assessment/Plan: Patient stable  Appreciate second opinion from Dr Yevette Edwardsumonski Agree that revision anterior surgery would place her at significant risk Index surgical procedure was very complicated and I had great concerns of SCI and extensive dural tear and CSF leak At this point I think observation is best Plan on d/c to SNF/rehab today  Venita Lickahari Javius Sylla, MD [facility] [phone]

## 2016-02-04 ENCOUNTER — Other Ambulatory Visit: Payer: Medicare Other

## 2016-02-09 ENCOUNTER — Encounter: Payer: Medicare Other | Admitting: Internal Medicine

## 2016-03-09 ENCOUNTER — Encounter: Payer: Self-pay | Admitting: Diagnostic Neuroimaging

## 2016-03-09 ENCOUNTER — Ambulatory Visit (INDEPENDENT_AMBULATORY_CARE_PROVIDER_SITE_OTHER): Payer: Medicare Other | Admitting: Diagnostic Neuroimaging

## 2016-03-09 VITALS — BP 121/74 | HR 76 | Temp 97.9°F | Ht 66.0 in

## 2016-03-09 DIAGNOSIS — M4712 Other spondylosis with myelopathy, cervical region: Secondary | ICD-10-CM | POA: Diagnosis not present

## 2016-03-09 DIAGNOSIS — R29898 Other symptoms and signs involving the musculoskeletal system: Secondary | ICD-10-CM

## 2016-03-09 DIAGNOSIS — I2510 Atherosclerotic heart disease of native coronary artery without angina pectoris: Secondary | ICD-10-CM | POA: Diagnosis not present

## 2016-03-09 DIAGNOSIS — G609 Hereditary and idiopathic neuropathy, unspecified: Secondary | ICD-10-CM | POA: Diagnosis not present

## 2016-03-09 NOTE — Progress Notes (Signed)
[facility]  PATIENT: Tara Cruz DOB: 06/14/1950  REFERRING CLINICIAN: D Brooks HISTORY FROM: patient  REASON FOR VISIT: follow up   HISTORICAL  CHIEF COMPLAINT:  Chief Complaint  Patient presents with  . Follow-up    RM 6 with transportation. Last seen 11/05/15. Here for increased leg weakness, balance worsening. Wheelchair bound.     HISTORY OF PRESENT ILLNESS:   UPDATE 03/09/16 (VRP): Since last visit, had cervical discectomy C4-5, C6 corpectomy and fusion C4-7. Then had ongoing progressive weakness. Went back to hospital in July 2017 for worsening weakness, had MRI c, t and l spine, with ongoing cord compression at C6. Second opinion confirmed that furhter surgery at C6 level would be too risky. Then went to [facility] rehab SNF and continues to do therapy exercises.   PRIOR HPI (11/05/15): 65 year old female here for evaluation of lower extremity weakness, gait and balance difficulty, foot drop. August 2016 patient had low back pain radiating to right leg. Patient underwent decompression surgery at that time. Symptoms slightly improved. February 2017 patient developed increasing weakness in left foot dorsiflexion leading to a "drop foot". Patient was also feeling low back pain and cramping in the left lower back region. In April 2017 patient ended up falling down onto her back. Patient thinks she fell down due to her right foot weakness. Following the fall in April 2017 patient had progressive lower extremity weakness and gait difficulty. In the past 1 month patient has also had right hand weakness, tingling and numbness in both hands. Patient had EMG/NCS by Dr. 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 Therefore neurology evaluation was recommended. The next day, patient was evaluated by Dr. Shon Baton on 10/21/15 who then immediately suspected cervical myelopathy, and ordered stat cervical MRI to rule  out cord compression. Patient requested to see Dr. Pearlean Brownie neurology, and therefore Dr. Shon Baton placed referral. The next day patient had MRI of the cervical spine which confirmed cord compression. Patient now set up for cervical decompression surgery tomorrow.   REVIEW OF SYSTEMS: Full 14 system review of systems performed and negative with exception of: Depression anxiety not asleep decreased energy weakness restless legs easy bruising feeling hot 10 pound weight loss swelling in legs ringing in ears aching muscles incontinence.  ALLERGIES: No Known Allergies  HOME MEDICATIONS: Outpatient Medications Prior to Visit  Medication Sig Dispense Refill  . albuterol (PROVENTIL HFA;VENTOLIN HFA) 108 (90 Base) MCG/ACT inhaler Inhale 1 puff into the lungs every 4 (four) hours as needed for wheezing or shortness of breath.    . Calcium Carb-Cholecalciferol (CALCIUM + D3) 600-800 MG-UNIT TABS Take 1 tablet by mouth daily.    . celecoxib (CELEBREX) 200 MG capsule Take 200 mg by mouth daily.    Marland Kitchen docusate sodium (COLACE) 100 MG capsule Take 1 capsule (100 mg total) by mouth 3 (three) times daily as needed for mild constipation. 30 capsule 0  . estradiol (ESTRACE) 0.5 MG tablet Take 0.5 mg by mouth daily.    Marland Kitchen ezetimibe-simvastatin (VYTORIN) 10-20 MG tablet Take 1 tablet by mouth daily.    . Fluticasone-Salmeterol (ADVAIR) 250-50 MCG/DOSE AEPB Inhale 1 puff into the lungs 2 (two) times daily as needed (For shortness of breath.).    Marland Kitchen methocarbamol (ROBAXIN) 500 MG tablet Take 1 tablet (500 mg total) by mouth 3 (three) times daily as needed for muscle spasms. 60 tablet 0  . ondansetron (ZOFRAN) 4 MG tablet Take 1 tablet (4 mg total)  by mouth every 8 (eight) hours as needed for nausea or vomiting. 20 tablet 0  . zolpidem (AMBIEN) 5 MG tablet Take 5 mg by mouth at bedtime as needed for sleep.    . simvastatin (ZOCOR) 40 MG tablet Take 1 tablet (40 mg total) by mouth at bedtime. (Patient not taking: Reported on  03/09/2016) 90 tablet 3  . aspirin EC 81 MG tablet Take 81 mg by mouth daily.    Marland Kitchen. oxyCODONE-acetaminophen (PERCOCET) 10-325 MG tablet Take 1 tablet by mouth every 4 (four) hours as needed for pain. 60 tablet 0   No facility-administered medications prior to visit.     PAST MEDICAL HISTORY: Past Medical History:  Diagnosis Date  . Abnormal stress test 01/03/2013   false positive by Dr. Allyson SabalBerry  . Allergy   . Arthritis    degenerative lumbar spine   . Asthma   . CAD (coronary artery disease) 01/30/2013   single vessel disease of small second diagonal branch, normal EF 65%  . COPD (chronic obstructive pulmonary disease) (HCC)    reviewed by Dr. Sherene SiresWert  . Depression   . Emphysema lung (HCC)   . Fall 01/13/2016  . H/O cardiovascular stress test 2014  . Hyperlipidemia   . Hypertension   . Neuropathy (HCC)   . Right bundle branch block   . Tobacco use    has tried to stop numerous times    PAST SURGICAL HISTORY: Past Surgical History:  Procedure Laterality Date  . ABDOMINAL HYSTERECTOMY  1978  . ANTERIOR CERVICAL DECOMP/DISCECTOMY FUSION N/A 11/06/2015   Procedure: ANTERIOR CERVICAL DISCECTOMY  C4-5, C6 CORPECTOMY, FUSION C4-7;  Surgeon: Venita Lickahari Brooks, MD;  Location: [facility] OR;  Service: Orthopedics;  Laterality: N/A;  . BACK SURGERY     12-2014   DR. BROOKS  . CARDIAC CATHETERIZATION  01/30/2013   2nd small diag branch with 95% stentosis, too small for intervention.  No disease in other vessels.  Marland Kitchen. EYE SURGERY     cataracts removed, /w IOL- bilateral, yag laser  . FOOT SURGERY     for plantar fascitis BILAT  . HAND SURGERY Left    2015  . LEFT HEART CATHETERIZATION WITH CORONARY ANGIOGRAM N/A 01/30/2013   Procedure: LEFT HEART CATHETERIZATION WITH CORONARY ANGIOGRAM;  Surgeon: Runell GessJonathan J Berry, MD;  [facility];  Service: Cardiovascular;  Laterality: N/A;  . TUBAL LIGATION      FAMILY HISTORY: Family History  Problem Relation Age of Onset  . Coronary artery disease Mother     . Alzheimer's disease Mother   . Rheum arthritis Mother   . Arthritis Mother   . Coronary artery disease Father   . Breast cancer Sister   . Asthma Sister   . Coronary artery disease Sister   . Hypertension Sister   . Arthritis Sister   . Coronary artery disease Maternal Grandfather   . Arthritis Paternal Grandmother   . Coronary artery disease Paternal Grandfather   . Breast cancer Cousin     SOCIAL HISTORY:  Social History   Social History  . Marital status: Significant Other    Spouse name: N/A  . Number of children: 2  . Years of education: 13   Occupational History  . Retired [employer]    retired   Social History Main Topics  . Smoking status: Current Every Day Smoker    Packs/day: 0.25    Years: 50.00    Types: Cigarettes  . Smokeless tobacco: Never Used  .  Alcohol use Yes     Comment: wine- on weekends   . Drug use: No  . Sexual activity: Yes   Other Topics Concern  . Not on file   Social History Narrative   Lives with fiancee   Caffeine use- coffee on weekend, 1 cup     PHYSICAL EXAM  GENERAL EXAM/CONSTITUTIONAL: Vitals:  Vitals:   03/09/16 0759  BP: 121/74  Pulse: 76  Temp: 97.9 F (36.6 C)  TempSrc: Oral  Height: 5\' 6"  (1.676 m)   There is no height or weight on file to calculate BMI. No exam data present  Patient is in no distress; well developed, nourished and groomed; neck is supple  CARDIOVASCULAR:  Examination of carotid arteries is normal; no carotid bruits  Regular rate and rhythm, no murmurs  Examination of peripheral vascular system by observation and palpation is normal  EYES:  Ophthalmoscopic exam of optic discs and posterior segments is normal; no papilledema or hemorrhages  MUSCULOSKELETAL:  Gait, strength, tone, movements noted in Neurologic exam below  NEUROLOGIC: MENTAL STATUS:  No flowsheet data found.  awake, alert, oriented to person, place and time  recent and remote memory  intact  normal attention and concentration  language fluent, comprehension intact, naming intact,   fund of knowledge appropriate  CRANIAL NERVE:   2nd - no papilledema on fundoscopic exam  2nd, 3rd, 4th, 6th - pupils equal and reactive to light, visual fields full to confrontation, extraocular muscles intact, no nystagmus  5th - facial sensation symmetric  7th - facial strength symmetric  8th - hearing intact  9th - palate elevates symmetrically, uvula midline  11th - shoulder shrug symmetric  12th - tongue protrusion midline  MOTOR:   normal bulk  BILATERAL (LEFT WEAKER THAN RIGHT) DELTOID 3, BICEPS 4, TRICEPS 3, FINGER ABDUCTION 3, GRIP 2-3   BILATERAL HIP FLEXION 3, KE/KF 4, RIGHT DF 1-2, LEFT DF 2  SENSORY:   normal and symmetric to light touch, temperature, vibration; DECR PP IN FINGERS AND TOES/FEET  COORDINATION:   finger-nose-finger, fine finger movements normal  REFLEXES:   deep tendon reflexes --> BUE 2+, KNEES 2 (WITH PATHOLOGIC SPREAD AND SUPRAPATELLAR RESPONSES), ANKLES 0; DOWN GOING TOES; BORDERLINE HOFFMAN'S IN HANDS  GAIT/STATION:   IN WHEEL CHAIR; CANNOT STAND    DIAGNOSTIC DATA (LABS, IMAGING, TESTING) - I reviewed patient records, labs, notes, testing and imaging myself where available.  Lab Results  Component Value Date   WBC 6.6 01/13/2016   HGB 14.4 01/13/2016   HCT 45.0 01/13/2016   MCV 97.0 01/13/2016   PLT 323 01/13/2016      Component Value Date/Time   NA 140 01/13/2016 1534   K 4.4 01/13/2016 1534   CL 104 01/13/2016 1534   CO2 28 01/13/2016 1534   GLUCOSE 104 (H) 01/13/2016 1534   BUN 17 01/13/2016 1534   CREATININE 0.65 01/13/2016 1534   CREATININE 1.02 01/23/2013 0802   CALCIUM 10.2 01/13/2016 1534   PROT 6.4 (L) 01/13/2016 1534   PROT 6.6 11/05/2015 1236   ALBUMIN 3.8 01/13/2016 1534   AST 26 01/13/2016 1534   ALT 25 01/13/2016 1534   ALKPHOS 52 01/13/2016 1534   BILITOT 0.7 01/13/2016 1534   GFRNONAA >60  01/13/2016 1534   GFRAA >60 01/13/2016 1534   Lab Results  Component Value Date   CHOL 163 01/31/2015   HDL 46.80 01/31/2015   LDLCALC 95 01/31/2015   TRIG 108.0 01/31/2015   CHOLHDL 3 01/31/2015  No results found for: HGBA1C Lab Results  Component Value Date   VITAMINB12 664 11/05/2015   Lab Results  Component Value Date   TSH 0.989 11/05/2015     11/08/14 MRI lumbar spine [report only] - At L3-4 facet arthropathy, anterolisthesis, ligamentum flavum hypertrophy, synovial cyst, resulting in severe central canal stenosis - At L4-5 moderate central canal and mild to moderate right foraminal stenosis - At L5-S1 small central disc protrusion without mass effect on S1 nerve roots  09/18/15 MRI lumbar spine [report only] - Postsurgical changes of decompression and interbody fusion L3-4; mild left and mild-to-moderate right foraminal stenosis - At L4-5 mild central canal stenosis and mild bilateral foraminal stenosis - At L5-S1 small central disc protrusion which abuts the bilateral S1 roots without significant nerve root displacement  10/21/15 EMG/NCS [Dr. 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 - No evidence of lumbosacral radiculopathy based on EMG; lumbar paraspinal muscles were normal  10/22/15 MRI cervical spine [report only] - Multilevel degenerative disc disease - At C6-7 large right paracentral disc extrusion with marked mass effect on cord and causes severe canal stenosis with edema/myelomalacia; mild to moderate bilateral foraminal stenosis - At C5-6 disc osteophyte complex with mild mass effect on ventral hemicord, severe right foraminal stenosis, moderate left for multiple stenosis - At C2-3 moderate right and mild left foraminal stenosis - At C3-4 mild to moderate left and mild right foraminal stenosis - At C4-5 moderate right frontal stenosis - At C7-T1 moderate left foraminal stenosis  12/29/15 MRI cervical  spine [I reviewed images myself and agree with interpretation. -VRP]  - Corpectomy at C6 with ACDF from C4 through C7. Considerable artifact from the fusion hardware. 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 Abnormal T2 signal in the cord immediately distal to that consistent with gliosis. - Facet arthropathy at C2-3 and C3-4 with neural foraminal stenosis that could be symptomatic.     ASSESSMENT AND PLAN  65 y.o. year old female here with history of severe lumbar spinal stenosis in June 2016, status post decompression, with subsequent left lower extremity weakness in February 2017, with subsequent fall in April 2017. Suspect patient had developed left lumbar radiculopathy in February 2017, leading to gait difficulty and subsequent fall. When patient fell in April 2017 she may have damaged/injured due to acute disc herniation at that time. Patient had definitive treatment with cervical decompression surgery, but unfortunately patient continues to worsen, likely in the setting up ongoing cord compression at C6 level from residual bone spurring. It'll remain to be seen in patients recovery and course of symptoms in the future.  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 Neuropathy labs were unremarkable.     Dx:  1. Spondylosis, cervical, with myelopathy   2. Weakness of both lower extremities   3. Hereditary and idiopathic peripheral neuropathy     PLAN: - continue therapy exercises - may consider second opinion for cervical spinal cord compression; prognosis remains guarded  Return for return to PCP and Dr. Shon Baton.    Suanne Marker, MD 03/09/2016, 8:26 AM Certified in Neurology, Neurophysiology and Neuroimaging  [facility] [address] [place], [place] [address] [phone]

## 2016-03-09 NOTE — Patient Instructions (Addendum)
-   continue therapy exercises

## 2016-03-30 NOTE — Telephone Encounter (Signed)
A user error has taken place: encounter opened in error, closed for administrative reasons.

## 2016-04-16 ENCOUNTER — Inpatient Hospital Stay (HOSPITAL_COMMUNITY)
Admission: AD | Admit: 2016-04-16 | Discharge: 2016-04-21 | DRG: 190 | Disposition: A | Discharge status: Home / Self Care | Payer: Medicare Other | Source: Other Acute Inpatient Hospital | Attending: Internal Medicine | Admitting: Internal Medicine

## 2016-04-16 ENCOUNTER — Inpatient Hospital Stay (HOSPITAL_COMMUNITY): Payer: Medicare Other

## 2016-04-16 DIAGNOSIS — M4802 Spinal stenosis, cervical region: Secondary | ICD-10-CM | POA: Diagnosis present

## 2016-04-16 DIAGNOSIS — G9589 Other specified diseases of spinal cord: Secondary | ICD-10-CM | POA: Diagnosis present

## 2016-04-16 DIAGNOSIS — R634 Abnormal weight loss: Secondary | ICD-10-CM | POA: Diagnosis present

## 2016-04-16 DIAGNOSIS — I119 Hypertensive heart disease without heart failure: Secondary | ICD-10-CM | POA: Diagnosis present

## 2016-04-16 DIAGNOSIS — E43 Unspecified severe protein-calorie malnutrition: Secondary | ICD-10-CM | POA: Diagnosis present

## 2016-04-16 DIAGNOSIS — I10 Essential (primary) hypertension: Secondary | ICD-10-CM | POA: Diagnosis present

## 2016-04-16 DIAGNOSIS — Z993 Dependence on wheelchair: Secondary | ICD-10-CM | POA: Diagnosis not present

## 2016-04-16 DIAGNOSIS — Z7982 Long term (current) use of aspirin: Secondary | ICD-10-CM | POA: Diagnosis not present

## 2016-04-16 DIAGNOSIS — Z981 Arthrodesis status: Secondary | ICD-10-CM

## 2016-04-16 DIAGNOSIS — I2584 Coronary atherosclerosis due to calcified coronary lesion: Secondary | ICD-10-CM

## 2016-04-16 DIAGNOSIS — Z79899 Other long term (current) drug therapy: Secondary | ICD-10-CM

## 2016-04-16 DIAGNOSIS — Z820 Family history of epilepsy and other diseases of the nervous system: Secondary | ICD-10-CM

## 2016-04-16 DIAGNOSIS — Z803 Family history of malignant neoplasm of breast: Secondary | ICD-10-CM | POA: Diagnosis not present

## 2016-04-16 DIAGNOSIS — Z8249 Family history of ischemic heart disease and other diseases of the circulatory system: Secondary | ICD-10-CM | POA: Diagnosis not present

## 2016-04-16 DIAGNOSIS — M4712 Other spondylosis with myelopathy, cervical region: Secondary | ICD-10-CM | POA: Diagnosis present

## 2016-04-16 DIAGNOSIS — F17210 Nicotine dependence, cigarettes, uncomplicated: Secondary | ICD-10-CM | POA: Diagnosis present

## 2016-04-16 DIAGNOSIS — I517 Cardiomegaly: Secondary | ICD-10-CM | POA: Diagnosis present

## 2016-04-16 DIAGNOSIS — J441 Chronic obstructive pulmonary disease with (acute) exacerbation: Principal | ICD-10-CM | POA: Diagnosis present

## 2016-04-16 DIAGNOSIS — G9520 Unspecified cord compression: Secondary | ICD-10-CM

## 2016-04-16 DIAGNOSIS — G959 Disease of spinal cord, unspecified: Secondary | ICD-10-CM

## 2016-04-16 DIAGNOSIS — Z8261 Family history of arthritis: Secondary | ICD-10-CM | POA: Diagnosis not present

## 2016-04-16 DIAGNOSIS — I2510 Atherosclerotic heart disease of native coronary artery without angina pectoris: Secondary | ICD-10-CM | POA: Diagnosis present

## 2016-04-16 DIAGNOSIS — Z87898 Personal history of other specified conditions: Secondary | ICD-10-CM

## 2016-04-16 DIAGNOSIS — I214 Non-ST elevation (NSTEMI) myocardial infarction: Secondary | ICD-10-CM | POA: Diagnosis not present

## 2016-04-16 DIAGNOSIS — I4510 Unspecified right bundle-branch block: Secondary | ICD-10-CM | POA: Diagnosis present

## 2016-04-16 DIAGNOSIS — R0602 Shortness of breath: Secondary | ICD-10-CM | POA: Diagnosis not present

## 2016-04-16 DIAGNOSIS — Z825 Family history of asthma and other chronic lower respiratory diseases: Secondary | ICD-10-CM

## 2016-04-16 DIAGNOSIS — I209 Angina pectoris, unspecified: Secondary | ICD-10-CM

## 2016-04-16 DIAGNOSIS — R29898 Other symptoms and signs involving the musculoskeletal system: Secondary | ICD-10-CM | POA: Diagnosis present

## 2016-04-16 DIAGNOSIS — J90 Pleural effusion, not elsewhere classified: Secondary | ICD-10-CM | POA: Diagnosis present

## 2016-04-16 DIAGNOSIS — M6281 Muscle weakness (generalized): Secondary | ICD-10-CM | POA: Diagnosis present

## 2016-04-16 DIAGNOSIS — R079 Chest pain, unspecified: Secondary | ICD-10-CM | POA: Diagnosis present

## 2016-04-16 LAB — BASIC METABOLIC PANEL
ANION GAP: 9 (ref 5–15)
BUN: 17 mg/dL (ref 6–20)
CHLORIDE: 101 mmol/L (ref 101–111)
CO2: 32 mmol/L (ref 22–32)
Calcium: 9.7 mg/dL (ref 8.9–10.3)
Creatinine, Ser: 0.51 mg/dL (ref 0.44–1.00)
GFR calc Af Amer: >60 mL/min (ref >60)
GLUCOSE: 95 mg/dL (ref 65–99)
POTASSIUM: 4.1 mmol/L (ref 3.5–5.1)
Sodium: 142 mmol/L (ref 135–145)

## 2016-04-16 LAB — CBC WITH DIFFERENTIAL/PLATELET
BASOS ABS: 0 10*3/uL (ref 0.0–0.1)
Basophils Relative: 0 %
Eosinophils Absolute: 0 10*3/uL (ref 0.0–0.7)
Eosinophils Relative: 0 %
HEMATOCRIT: 46 % (ref 36.0–46.0)
Hemoglobin: 14.8 g/dL (ref 12.0–15.0)
LYMPHS ABS: 1.5 10*3/uL (ref 0.7–4.0)
LYMPHS PCT: 20 %
MCH: 31.4 pg (ref 26.0–34.0)
MCHC: 32.2 g/dL (ref 30.0–36.0)
MCV: 97.5 fL (ref 78.0–100.0)
MONO ABS: 0.9 10*3/uL (ref 0.1–1.0)
Monocytes Relative: 12 %
Neutro Abs: 5.3 10*3/uL (ref 1.7–7.7)
Neutrophils Relative %: 68 %
Platelets: 274 10*3/uL (ref 150–400)
RBC: 4.72 MIL/uL (ref 3.87–5.11)
RDW: 13.7 % (ref 11.5–15.5)
WBC: 7.8 10*3/uL (ref 4.0–10.5)

## 2016-04-16 LAB — TROPONIN I
TROPONIN I: 0.39 ng/mL — AB (ref <0.03)
TROPONIN I: 0.26 ng/mL — AB (ref <0.03)
Troponin I: 0.6 ng/mL (ref <0.03)

## 2016-04-16 LAB — HEPARIN LEVEL (UNFRACTIONATED)
Heparin Unfractionated: 0.46 IU/mL (ref 0.30–0.70)
Heparin Unfractionated: 0.31 IU/mL (ref 0.30–0.70)

## 2016-04-16 MED ORDER — HEPARIN (PORCINE) IN NACL 100-0.45 UNIT/ML-% IJ SOLN
600.0000 [IU]/h | INTRAMUSCULAR | Status: DC
  Administered 2016-04-16 – 2016-04-17 (×2): 600 [IU]/h via INTRAVENOUS
  Filled 2016-04-16: qty 250

## 2016-04-16 MED ORDER — POLYETHYLENE GLYCOL 3350 17 G PO PACK
17.0000 g | PACK | Freq: Every day | ORAL | Status: DC
  Administered 2016-04-16 – 2016-04-20 (×4): 17 g via ORAL
  Filled 2016-04-16 (×5): qty 1

## 2016-04-16 MED ORDER — ZOLPIDEM TARTRATE 5 MG PO TABS
5.0000 mg | ORAL_TABLET | Freq: Every evening | ORAL | Status: DC | PRN
  Administered 2016-04-16 – 2016-04-20 (×5): 5 mg via ORAL
  Filled 2016-04-16 (×5): qty 1

## 2016-04-16 MED ORDER — MOMETASONE FURO-FORMOTEROL FUM 200-5 MCG/ACT IN AERO
2.0000 | INHALATION_SPRAY | Freq: Two times a day (BID) | RESPIRATORY_TRACT | Status: DC
  Administered 2016-04-16 – 2016-04-21 (×9): 2 via RESPIRATORY_TRACT
  Filled 2016-04-16: qty 8.8

## 2016-04-16 MED ORDER — ALBUTEROL SULFATE (2.5 MG/3ML) 0.083% IN NEBU: 2.5000 mg | INHALATION_SOLUTION | RESPIRATORY_TRACT | Status: DC | PRN

## 2016-04-16 MED ORDER — ASPIRIN 81 MG PO CHEW: 324.0000 mg | CHEWABLE_TABLET | ORAL | Status: AC

## 2016-04-16 MED ORDER — EZETIMIBE-SIMVASTATIN 10-20 MG PO TABS: 1.0000 | ORAL_TABLET | Freq: Every day | ORAL | Status: DC

## 2016-04-16 MED ORDER — ASPIRIN 300 MG RE SUPP: 300.0000 mg | RECTAL | Status: AC

## 2016-04-16 MED ORDER — HEPARIN (PORCINE) IN NACL 100-0.45 UNIT/ML-% IJ SOLN
INTRAMUSCULAR | Status: AC
  Filled 2016-04-16: qty 250

## 2016-04-16 MED ORDER — ACETAMINOPHEN 325 MG PO TABS: 650.0000 mg | ORAL_TABLET | Freq: Four times a day (QID) | ORAL | Status: DC | PRN

## 2016-04-16 MED ORDER — LORATADINE 10 MG PO TABS
10.0000 mg | ORAL_TABLET | Freq: Every day | ORAL | Status: DC
  Administered 2016-04-16 – 2016-04-21 (×6): 10 mg via ORAL
  Filled 2016-04-16 (×6): qty 1

## 2016-04-16 MED ORDER — ASPIRIN EC 81 MG PO TBEC: 81.0000 mg | DELAYED_RELEASE_TABLET | Freq: Every day | ORAL | Status: DC

## 2016-04-16 MED ORDER — ACETAMINOPHEN 325 MG PO TABS: 650.0000 mg | ORAL_TABLET | ORAL | Status: DC | PRN

## 2016-04-16 MED ORDER — DARIFENACIN HYDROBROMIDE ER 7.5 MG PO TB24
7.5000 mg | ORAL_TABLET | Freq: Every day | ORAL | Status: DC
  Administered 2016-04-16 – 2016-04-21 (×4): 7.5 mg via ORAL
  Filled 2016-04-16 (×6): qty 1

## 2016-04-16 MED ORDER — IOPAMIDOL (ISOVUE-370) INJECTION 76%
INTRAVENOUS | Status: AC
  Administered 2016-04-16: 70 mL via INTRAVENOUS
  Filled 2016-04-16: qty 100

## 2016-04-16 MED ORDER — NITROGLYCERIN 0.4 MG SL SUBL: 0.4000 mg | SUBLINGUAL_TABLET | SUBLINGUAL | Status: DC | PRN

## 2016-04-16 MED ORDER — ASPIRIN EC 81 MG PO TBEC
81.0000 mg | DELAYED_RELEASE_TABLET | Freq: Every day | ORAL | Status: DC
  Administered 2016-04-16 – 2016-04-21 (×6): 81 mg via ORAL
  Filled 2016-04-16 (×6): qty 1

## 2016-04-16 MED ORDER — ONDANSETRON HCL 4 MG/2ML IJ SOLN: 4.0000 mg | Freq: Four times a day (QID) | INTRAMUSCULAR | Status: DC | PRN

## 2016-04-16 MED ORDER — HEPARIN BOLUS VIA INFUSION
1000.0000 [IU] | Freq: Once | INTRAVENOUS | Status: AC
  Administered 2016-04-16: 1000 [IU] via INTRAVENOUS
  Filled 2016-04-16: qty 1000

## 2016-04-16 MED ORDER — NAPROXEN SODIUM 275 MG PO TABS
275.0000 mg | ORAL_TABLET | Freq: Two times a day (BID) | ORAL | Status: DC
  Administered 2016-04-16 – 2016-04-21 (×9): 275 mg via ORAL
  Filled 2016-04-16 (×14): qty 1

## 2016-04-16 MED ORDER — SIMVASTATIN 20 MG PO TABS
20.0000 mg | ORAL_TABLET | Freq: Every day | ORAL | Status: DC
  Administered 2016-04-16 – 2016-04-21 (×6): 20 mg via ORAL
  Filled 2016-04-16 (×6): qty 1

## 2016-04-16 MED ORDER — EZETIMIBE 10 MG PO TABS
10.0000 mg | ORAL_TABLET | Freq: Every day | ORAL | Status: DC
  Administered 2016-04-16 – 2016-04-21 (×6): 10 mg via ORAL
  Filled 2016-04-16 (×6): qty 1

## 2016-04-16 MED ORDER — SERTRALINE HCL 50 MG PO TABS
50.0000 mg | ORAL_TABLET | Freq: Every day | ORAL | Status: DC
  Administered 2016-04-16 – 2016-04-21 (×6): 50 mg via ORAL
  Filled 2016-04-16 (×6): qty 1

## 2016-04-16 NOTE — Progress Notes (Signed)
ANTICOAGULATION CONSULT NOTE  Pharmacy Consult for heparin Indication: chest pain/ACS  No Known Allergies  Patient Measurements: Height: 5\' 6"  (167.6 cm) Weight: 116 lb 1.6 oz (52.7 kg) IBW/kg (Calculated) : 59.3  Vital Signs: Temp: 98 F (36.7 C) (11/17 0439) Temp Source: Oral (11/17 0439) BP: 120/68 (11/17 1421) Pulse Rate: 83 (11/17 1421)   Medical History: Past Medical History:  Diagnosis Date  . Abnormal stress test 01/03/2013   false positive by Dr. Allyson SabalBerry  . Allergy   . Arthritis    degenerative lumbar spine   . Asthma   . CAD (coronary artery disease) 01/30/2013   single vessel disease of small second diagonal branch, normal EF 65%  . COPD (chronic obstructive pulmonary disease) (HCC)    reviewed by Dr. Sherene SiresWert  . Depression   . Emphysema lung (HCC)   . Fall 01/13/2016  . H/O cardiovascular stress test 2014  . Hyperlipidemia   . Hypertension   . Neuropathy (HCC)   . Right bundle branch block   . Tobacco use    has tried to stop numerous times    Assessment: 65yo female [facility] and [facility] - continuing on heparin. Patient remains therapeutic on heparin at 600 units/h. CBC wnl, no bleed documented.  Goal of Therapy:  Heparin level 0.3-0.7 units/ml Monitor platelets by anticoagulation protocol: Yes   Plan:  Heparin IV at 600 units/hr Daily heparin level/CBC Monitor s/sx bleeding F/u Cards plans  Babs BertinHaley Lena Fieldhouse, PharmD, BCPS Clinical Pharmacist 04/16/2016 3:32 PM

## 2016-04-16 NOTE — Progress Notes (Signed)
Patient has been NPO today,  [facility] made aware and orders received for diet. Will monitor patient. Edye Hainline, Randall AnKristin Jessup rN

## 2016-04-16 NOTE — Progress Notes (Signed)
Patient Name: Tara Bottcheratricia A Hankin Date of Encounter: 04/16/2016  [facility]  Hospital Problem List     Active Problems:   Essential hypertension   Chest pain   CAD (coronary artery disease)   Spondylosis, cervical, with myelopathy   NSTEMI (non-ST elevated myocardial infarction) (HCC)    Subjective   Chest pain finally improved, some time after being given ASA 325 mg (also on heparin)  Inpatient Medications    Scheduled Meds: . aspirin  324 mg Oral NOW   Or  . aspirin  300 mg Rectal NOW  . aspirin EC  81 mg Oral Daily  . darifenacin  7.5 mg Oral Daily  . simvastatin  20 mg Oral q1800   And  . ezetimibe  10 mg Oral q1800  . loratadine  10 mg Oral Daily  . mometasone-formoterol  2 puff Inhalation BID  . naproxen sodium  275 mg Oral BID WC  . polyethylene glycol  17 g Oral Daily  . sertraline  50 mg Oral Daily   Continuous Infusions: . heparin 600 Units/hr (04/16/16 16100608)   PRN Meds: acetaminophen, albuterol, nitroGLYCERIN, ondansetron (ZOFRAN) IV   Vital Signs    Vitals:   04/16/16 0439 04/16/16 1016  BP: 104/63   Pulse: 89 88  Resp: 18 18  Temp: 98 F (36.7 C)   TempSrc: Oral   SpO2: 92% 96%  Weight: 116 lb 1.6 oz (52.7 kg)   Height: 5\' 6"  (1.676 m)     Intake/Output Summary (Last 24 hours) at 04/16/16 1051 Last data filed at 04/16/16 0608  Gross per 24 hour  Intake                0 ml  Output              100 ml  Net             -100 ml   Filed Weights   04/16/16 0439  Weight: 116 lb 1.6 oz (52.7 kg)    Physical Exam    GEN: Well nourished, well developed, in no acute distress.  HEENT: Grossly normal.  Neck: Supple, no JVD, carotid bruits, or masses. Cardiac: RRR, no murmurs, rubs, or gallops. No clubbing, cyanosis, trace-1+ pedal edema.  Radials/DP/PT 2+ and equal bilaterally.  Respiratory:  Respirations regular and unlabored, clear to auscultation bilaterally. GI: Soft, nontender, nondistended, BS + x 4. Skin: warm  and dry, no rash. Neuro:  Strength and sensation are at baseline. Psych: AAOx3.  Normal affect.  Labs    CBC  Recent Labs  04/16/16 0654  WBC 7.8  NEUTROABS 5.3  HGB 14.8  HCT 46.0  MCV 97.5  PLT 274   Basic Metabolic Panel  Recent Labs  04/16/16 0654  NA 142  K 4.1  CL 101  CO2 32  GLUCOSE 95  BUN 17  CREATININE 0.51  CALCIUM 9.7    Telemetry    SR, occ ST - Personally Reviewed  ECG    SR, RBBB is old - Personally Reviewed  Radiology    Ct Angio Chest Pe W Or Wo Contrast Result Date: 04/16/2016 CLINICAL DATA:  Pt got choked yesterday while eating; Heimlich maneuver done, very difficult to get food dislodged; pt had onset of midline chest pain, increased SOB after episode. Pt's chest pain has subsided, and SOB has improved; 70ml Isovue.*comment was truncated* EXAM: CT ANGIOGRAPHY CHEST WITH CONTRAST TECHNIQUE: Multidetector CT imaging of the chest was performed using the standard  protocol during bolus administration of intravenous contrast. Multiplanar CT image reconstructions and MIPs were obtained to evaluate the vascular anatomy. CONTRAST:  70 mL Isovue COMPARISON:  Chest CT 5 8 17  FINDINGS: Cardiovascular: No filling defects within pulmonary suggest acute pulmonary embolism. No acute findings of the aorta great vessels. Minimal intimal calcification of the aortic arch. Coronary artery calcification. Mediastinum/Nodes: No axillary or supraclavicular adenopathy. Mediastinal hilar adenopathy. Esophagus normal. Lungs/Pleura: Centrilobular emphysema in the upper lobes. No aspiration pneumonitis. Minimal basilar atelectasis from airways normal. A single focus of mucous plugging is noted in the LEFT lower lobe (image 54, series 6). Upper Abdomen: Limited view of the liver, kidneys, pancreas are unremarkable. Normal adrenal glands. No aggressive osseous lesion. Anterior cervical fusion noted Musculoskeletal: Anterior cervical fusion. No acute osseous abnormality. Review of  the MIP images confirms the above findings. IMPRESSION: 1. No evidence acute pulmonary embolism. 2. No aspiration pneumonitis or airway foreign body. 3. Small Focus of mucous plugging in the LEFT lower lobe. 4. Coronary artery calcification and aortic atherosclerotic calcification. 5. Upper lobe central lobular emphysema. Electronically Signed   By: Genevive BiStewart  Edmunds M.D.   On: 04/16/2016 10:17    Cardiac Studies   Echo ordered  Patient Profile      65 y.o. female with PMH of small 95% D2 rx medically, hypertension, COPD, cervical spondylosis with myelopathy, and prior tobacco abuse, admitted 11/16 for chest pain and shortness of breath.    Assessment & Plan    1. Chest pain: - Unclear etiology of chest pain in the setting of mildly elevated troponin level.   - She is at an increased risk of a pulmonary embolism based on being bed bound but CTA chest was negative.   - Low suspicion for myopericarditis but sx did get better after ASA.  ECG w/ RBBB (old)  - On heparin drip. - Aspirin daily for now.    2. Coronary artery disease: Patient with known 95% D2, not amenable to intervention. -  Now with chest pain potentially due to NSTEM-ACS.  - BP too low for beta-blocker but is on Vytorin and aspirin. - Continue home medications at this time. - Further work up for chest pain as above.    3.  COPD: - Patient with a history of COPD.  She does have shortness of breath but without wheezing on examination. Low suspicion for COPD exacerbation at this time.   - Continue home albuterol as needed and advair.    4. Myelopathy: -  Patient with known myelopathy.  She is bed bound due to profound muscle weakness. - Foley cath   SignedTheodore Demark, Mccall Lomax, PA-C  04/16/2016, 10:51 AM

## 2016-04-16 NOTE — Progress Notes (Signed)
ANTICOAGULATION CONSULT NOTE - Initial Consult  Pharmacy Consult for heparin Indication: chest pain/ACS  No Known Allergies  Patient Measurements: Height: 5\' 6"  (167.6 cm) Weight: 116 lb 1.6 oz (52.7 kg) IBW/kg (Calculated) : 59.3  Vital Signs: Temp: 98 F (36.7 C) (11/17 0439) Temp Source: Oral (11/17 0439) BP: 104/63 (11/17 0439) Pulse Rate: 89 (11/17 0439)   Medical History: Past Medical History:  Diagnosis Date  . Abnormal stress test 01/03/2013   false positive by Dr. Allyson SabalBerry  . Allergy   . Arthritis    degenerative lumbar spine   . Asthma   . CAD (coronary artery disease) 01/30/2013   single vessel disease of small second diagonal branch, normal EF 65%  . COPD (chronic obstructive pulmonary disease) (HCC)    reviewed by Dr. Sherene SiresWert  . Depression   . Emphysema lung (HCC)   . Fall 01/13/2016  . H/O cardiovascular stress test 2014  . Hyperlipidemia   . Hypertension   . Neuropathy (HCC)   . Right bundle branch block   . Tobacco use    has tried to stop numerous times    Assessment: 65[facility] to begin heparin.  Goal of Therapy:  Heparin level 0.3-0.7 units/ml Monitor platelets by anticoagulation protocol: Yes   Plan:  Will give heparin 1000 units IV bolus x1 followed by gtt at 600 units/hr and monitor heparin levels and CBC.  Vernard GamblesVeronda Toa Mia, PharmD, BCPS  04/16/2016,5:35 AM

## 2016-04-17 ENCOUNTER — Inpatient Hospital Stay (HOSPITAL_COMMUNITY): Payer: Medicare Other

## 2016-04-17 DIAGNOSIS — R079 Chest pain, unspecified: Secondary | ICD-10-CM

## 2016-04-17 LAB — CBC
HEMATOCRIT: 45.7 % (ref 36.0–46.0)
HEMOGLOBIN: 14.3 g/dL (ref 12.0–15.0)
MCH: 31.2 pg (ref 26.0–34.0)
MCHC: 31.3 g/dL (ref 30.0–36.0)
MCV: 99.8 fL (ref 78.0–100.0)
Platelets: 259 10*3/uL (ref 150–400)
RBC: 4.58 MIL/uL (ref 3.87–5.11)
RDW: 13.6 % (ref 11.5–15.5)
WBC: 7.5 10*3/uL (ref 4.0–10.5)

## 2016-04-17 LAB — HEPARIN LEVEL (UNFRACTIONATED): Heparin Unfractionated: 0.31 IU/mL (ref 0.30–0.70)

## 2016-04-17 LAB — ECHOCARDIOGRAM COMPLETE
Height: 66 in
Weight: 1857.6 oz

## 2016-04-17 MED ORDER — HEPARIN SODIUM (PORCINE) 5000 UNIT/ML IJ SOLN
5000.0000 [IU] | Freq: Three times a day (TID) | INTRAMUSCULAR | Status: DC
  Administered 2016-04-18 – 2016-04-21 (×9): 5000 [IU] via SUBCUTANEOUS
  Filled 2016-04-17 (×9): qty 1

## 2016-04-17 MED ORDER — SODIUM CHLORIDE 0.9 % IV SOLN
INTRAVENOUS | Status: DC
  Administered 2016-04-17: 12:00:00 via INTRAVENOUS

## 2016-04-17 MED ORDER — ORAL CARE MOUTH RINSE
15.0000 mL | Freq: Two times a day (BID) | OROMUCOSAL | Status: DC
  Administered 2016-04-17 – 2016-04-21 (×5): 15 mL via OROMUCOSAL

## 2016-04-17 MED ORDER — GUAIFENESIN ER 600 MG PO TB12
600.0000 mg | ORAL_TABLET | Freq: Two times a day (BID) | ORAL | Status: DC
  Administered 2016-04-17 – 2016-04-21 (×9): 600 mg via ORAL
  Filled 2016-04-17 (×9): qty 1

## 2016-04-17 MED ORDER — GADOBENATE DIMEGLUMINE 529 MG/ML IV SOLN
11.0000 mL | Freq: Once | INTRAVENOUS | Status: AC | PRN
  Administered 2016-04-17: 11 mL via INTRAVENOUS

## 2016-04-17 NOTE — Progress Notes (Signed)
  I reviewed the patient's case with Dr. Mayford Knifeurner. She recommended that the patient's heparin be discontinued and she placed on DVT dose subcutaneous heparin if her echocardiogram returned normal with normal LV function and no wall motion abnormalities. Her echocardiogram does show normal LV function with no wall motion abnormalities. Therefore, IV heparin will be discontinued. Subcutaneous heparin at DVT dose will be started. Tereso NewcomerScott Telma Pyeatt, PA-C   04/17/2016 5:38 PM

## 2016-04-17 NOTE — Progress Notes (Signed)
2D echo not done yet.  Will call to get done stat.  If LVF is normal then make NPO after MN for stress test in am.

## 2016-04-17 NOTE — Consult Note (Addendum)
Medical Consultation   [name]  ZOX:[id number]  DOB: [birth year]  DOA: 04/16/2016  PCP: Rogelia Boga, MD    Requesting physician: Dr. Mayford Knife  Reason for consultation: SHortness of breath     History of Present Illness: Tara Cruz is an 65 y.o. female  Tara Cruz is an 65 y.o. female with multiple medical issues listed below, including CAD with 95 % Dx2 treated medically, RBBB, HTN, COPD, and myelopaty with profound muscle weakness, admitted on 11/16 for the management of chest pain. She has been increasing her  mucous production without hemoptysis. Denies fevers, chills, night sweats or mucositis. Reports fevers up to , chills and night sweats. Denies any chest pain, chest wall pain or palpitations.Denies any sick contacts or recent long distant travels. Denies any abdominal pain. Has decreased appetite due to current symptoms.Reports nausea without vomiting. No dysphagia. Denies dizziness or vertigo. Denies lower extremity swelling at this time . No confusion .Denies any vision changes, double vision or headaches.   CT angio chest negative for PE, no aspiration pneumonitis or airway foreign body, small L mucous plug in the LLL. Upper centrolobullar emphysema  chest x-ray with bilateral pleural effusions among cardiomegaly. Last 2-D echo in June 2017 shows normal LVF with EF 65 to 60%, grade 1 diastolic dysfunction. white count 8.5  creatinine 1.0 BNP 519 troponin 0.02  glucose 128  Review of Systems:  As per HPI otherwise 10 point review of systems negative. She is progressively week due to her neurological disease.  Past Medical History: Past Medical History:  Diagnosis Date  . Abnormal stress test 01/03/2013   false positive by Dr. Allyson Sabal  . Allergy   . Arthritis    degenerative lumbar spine   . Asthma   . CAD (coronary artery disease) 01/30/2013   single vessel disease of small second diagonal branch, normal EF 65%  . COPD  (chronic obstructive pulmonary disease) (HCC)    reviewed by Dr. Sherene Sires  . Depression   . Emphysema lung (HCC)   . Fall 01/13/2016  . H/O cardiovascular stress test 2014  . Hyperlipidemia   . Hypertension   . Neuropathy (HCC)   . Right bundle branch block   . Tobacco use    has tried to stop numerous times    Past Surgical History: Past Surgical History:  Procedure Laterality Date  . ABDOMINAL HYSTERECTOMY  1978  . ANTERIOR CERVICAL DECOMP/DISCECTOMY FUSION N/A 11/06/2015   Procedure: ANTERIOR CERVICAL DISCECTOMY  C4-5, C6 CORPECTOMY, FUSION C4-7;  Surgeon: Venita Lick, MD;  Location: [facility] OR;  Service: Orthopedics;  Laterality: N/A;  . BACK SURGERY     12-2014   DR. BROOKS  . CARDIAC CATHETERIZATION  01/30/2013   2nd small diag branch with 95% stentosis, too small for intervention.  No disease in other vessels.  Marland Kitchen EYE SURGERY     cataracts removed, /w IOL- bilateral, yag laser  . FOOT SURGERY     for plantar fascitis BILAT  . HAND SURGERY Left    2015  . LEFT HEART CATHETERIZATION WITH CORONARY ANGIOGRAM N/A 01/30/2013   Procedure: LEFT HEART CATHETERIZATION WITH CORONARY ANGIOGRAM;  Surgeon: Runell Gess, MD;  Location: [facility] CATH LAB;  Service: Cardiovascular;  Laterality: N/A;  . TUBAL LIGATION       Allergies:  No Known Allergies   Social History: Social History   Social History  . Marital status: Significant  Other    Spouse name: N/A  . Number of children: 2  . Years of education: 13   Occupational History  . Retired [employer]    retired   Social History Main Topics  . Smoking status: Current Every Day Smoker    Packs/day: 0.25    Years: 50.00    Types: Cigarettes  . Smokeless tobacco: Never Used  . Alcohol use Yes     Comment: wine- on weekends   . Drug use: No  . Sexual activity: Yes   Other Topics Concern  . Not on file   Social History Narrative   Lives with fiancee   Caffeine use- coffee on weekend, 1 cup       Family  History: Family History  Problem Relation Age of Onset  . Coronary artery disease Mother   . Alzheimer's disease Mother   . Rheum arthritis Mother   . Arthritis Mother   . Coronary artery disease Father   . Breast cancer Sister   . Asthma Sister   . Coronary artery disease Sister   . Hypertension Sister   . Arthritis Sister   . Coronary artery disease Maternal Grandfather   . Arthritis Paternal Grandmother   . Coronary artery disease Paternal Grandfather   . Breast cancer Cousin     Family history reviewed and not pertinent    Physical Exam: Vitals:   04/17/16 0505 04/17/16 0728 04/17/16 1143 04/17/16 1151  BP: 105/60     Pulse: 76  86 77  Resp: 18     Temp: 98.2 F (36.8 C)     TempSrc: Oral     SpO2: 98% 99% 90% 98%  Weight:      Height:        Constitutional: Appears calm,  alert and awake, oriented x3, not in any acute distress.Very thin, chronically ill appearing. Eyes: PERLA, EOMI, irises appear normal, anicteric sclera,  ENMT: external ears and nose appear normal, normal hearing or hard of hearin. Lips appears normal, oropharynx mucosa, tongue, posterior pharynx appear normal  Neck: neck appears normal, no masses, normal ROM, no thyromegaly, no JVD  CVS: S1-S2 clear, no murmur rubs or gallops, no LE edema, normal pedal pulses  Respiratory: clear to auscultation bilaterally, no wheezing, rales or rhonchi. Respiratory effort normal. No accessory muscle use.  Abdomen: soft nontender, nondistended, normal bowel sounds, no hepatosplenomegaly, no hernias  Musculoskeletal: no cyanosis, clubbing or edema noted bilaterally.  Some muscle wasting noted at the upper extremities  Neuro:remarkable for profound weakness in her upper extremities and lower extremities  Psych: judgement and insight appear normal, stable mood and affect, mental status Skin: no rashes or lesions or ulcers, no induration or nodules   Data reviewed:  I have personally reviewed following labs and  imaging studies Labs:  CBC:  Recent Labs Lab 04/16/16 0654 04/17/16 0342  WBC 7.8 7.5  NEUTROABS 5.3  --   HGB 14.8 14.3  HCT 46.0 45.7  MCV 97.5 99.8  PLT 274 259    Basic Metabolic Panel:  Recent Labs Lab 04/16/16 0654  NA 142  K 4.1  CL 101  CO2 32  GLUCOSE 95  BUN 17  CREATININE 0.51  CALCIUM 9.7   GFR Estimated Creatinine Clearance: 56.8 mL/min (by C-G formula based on SCr of 0.51 mg/dL). Liver Function Tests: No results for input(s): AST, ALT, ALKPHOS, BILITOT, PROT, ALBUMIN in the last 168 hours. No results for input(s): LIPASE, AMYLASE in the last 168 hours.  No results for input(s): AMMONIA in the last 168 hours. Coagulation profile No results for input(s): INR, PROTIME in the last 168 hours.  Cardiac Enzymes:  Recent Labs Lab 04/16/16 1119 04/16/16 1715 04/16/16 2218  TROPONINI 0.60* 0.39* 0.26*   BNP: Invalid input(s): POCBNP CBG: No results for input(s): GLUCAP in the last 168 hours. D-Dimer No results for input(s): DDIMER in the last 72 hours. Hgb A1c No results for input(s): HGBA1C in the last 72 hours. Lipid Profile No results for input(s): CHOL, HDL, LDLCALC, TRIG, CHOLHDL, LDLDIRECT in the last 72 hours. Thyroid function studies No results for input(s): TSH, T4TOTAL, T3FREE, THYROIDAB in the last 72 hours.  Invalid input(s): FREET3 Anemia work up No results for input(s): VITAMINB12, FOLATE, FERRITIN, TIBC, IRON, RETICCTPCT in the last 72 hours. Urinalysis    Component Value Date/Time   COLORURINE YELLOW 01/14/2016 1814   APPEARANCEUR CLEAR 01/14/2016 1814   LABSPEC 1.012 01/14/2016 1814   PHURINE 6.5 01/14/2016 1814   GLUCOSEU NEGATIVE 01/14/2016 1814   HGBUR NEGATIVE 01/14/2016 1814   HGBUR negative 08/09/2008 0825   BILIRUBINUR NEGATIVE 01/14/2016 1814   BILIRUBINUR n 01/31/2015 1136   KETONESUR NEGATIVE 01/14/2016 1814   PROTEINUR NEGATIVE 01/14/2016 1814   UROBILINOGEN 0.2 01/31/2015 1136   UROBILINOGEN 0.2 08/09/2008  0825   NITRITE NEGATIVE 01/14/2016 1814   LEUKOCYTESUR NEGATIVE 01/14/2016 1814     Sepsis Labs Invalid input(s): PROCALCITONIN,  WBC,  LACTICIDVEN Microbiology No results found for this or any previous visit (from the past 240 hour(s)).     Inpatient Medications:   Scheduled Meds: . aspirin EC  81 mg Oral Daily  . darifenacin  7.5 mg Oral Daily  . simvastatin  20 mg Oral q1800   And  . ezetimibe  10 mg Oral q1800  . loratadine  10 mg Oral Daily  . mouth rinse  15 mL Mouth Rinse BID  . mometasone-formoterol  2 puff Inhalation BID  . naproxen sodium  275 mg Oral BID WC  . polyethylene glycol  17 g Oral Daily  . sertraline  50 mg Oral Daily   Continuous Infusions: . sodium chloride 10 mL/hr at 04/17/16 1230  . heparin 600 Units/hr (04/17/16 1230)     Radiological Exams on Admission: Ct Angio Chest Pe W Or Wo Contrast  Result Date: 04/16/2016 CLINICAL DATA:  Pt got choked yesterday while eating; Heimlich maneuver done, very difficult to get food dislodged; pt had onset of midline chest pain, increased SOB after episode. Pt's chest pain has subsided, and SOB has improved; 70ml Isovue.*comment was truncated* EXAM: CT ANGIOGRAPHY CHEST WITH CONTRAST TECHNIQUE: Multidetector CT imaging of the chest was performed using the standard protocol during bolus administration of intravenous contrast. Multiplanar CT image reconstructions and MIPs were obtained to evaluate the vascular anatomy. CONTRAST:  70 mL Isovue COMPARISON:  Chest CT 5 8 17  FINDINGS: Cardiovascular: No filling defects within pulmonary suggest acute pulmonary embolism. No acute findings of the aorta great vessels. Minimal intimal calcification of the aortic arch. Coronary artery calcification. Mediastinum/Nodes: No axillary or supraclavicular adenopathy. Mediastinal hilar adenopathy. Esophagus normal. Lungs/Pleura: Centrilobular emphysema in the upper lobes. No aspiration pneumonitis. Minimal basilar atelectasis from airways  normal. A single focus of mucous plugging is noted in the LEFT lower lobe (image 54, series 6). Upper Abdomen: Limited view of the liver, kidneys, pancreas are unremarkable. Normal adrenal glands. No aggressive osseous lesion. Anterior cervical fusion noted Musculoskeletal: Anterior cervical fusion. No acute osseous abnormality. Review of the MIP images  confirms the above findings. IMPRESSION: 1. No evidence acute pulmonary embolism. 2. No aspiration pneumonitis or airway foreign body. 3. Small Focus of mucous plugging in the LEFT lower lobe. 4. Coronary artery calcification and aortic atherosclerotic calcification. 5. Upper lobe central lobular emphysema. Electronically Signed   By: Genevive Bi M.D.   On: 04/16/2016 10:17    Impression/Recommendations Principal Problem:   NSTEMI (non-ST elevated myocardial infarction) (HCC) Active Problems:   Essential hypertension   CAD (coronary artery disease)   Spondylosis, cervical, with myelopathy  Acute on chronic respiratory failure in the setting of COPD, NSTEMI  and with possible neuro involvement.CT angio chest negative for PE, no aspiration pneumonitis or airway foreign body, small L mucous plug in the LLL. Upper centrolobullar emphysema . WBC normal.    chest x-ray with bilateral pleural effusions among cardiomegaly.  Patient has a history of cervical spondylosis with myelopathy, with likely progression of the disease Will recommend ortho consult as the patient may need further imaging which may include MRI of the brain. Her Neurologist is Dr. Marjory Lies at [facility]  Recommend PT/OT consult.  SLP Recommend nebs, RT for pulmonary toilet, supll O2 and Mucinex   Othe rmedical issues  As per primary team     Thank you for this consultation.  Our [facility] hospitalist team will follow the patient with you.      [facility] E PA-C [facility] 04/17/2016, 1:09 PM   Attestation of Attending Supervision of Advanced Practitioner (PA/NP):  Evaluation and management procedures were performed by the Advanced Practitioner under my supervision and collaboration.  I have seen and examined the patient and have reviewed the Advanced Practitioner's note and chart, and I agree with the management and plan.  This is a 65 year old female patient who was seen by Dr. Shon Baton due to cervical myelopathy. 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 Patient did see Dr Marjory Lies on 6/7 and had a negative neuropathy panel. The patient went for ACDF from C4-C7 on 6/8. At that time Dr. Shon Baton noted that she had extensive scarring of the dura at C6 level the patient had a corpectomy at that level. From that time, the patient has had progressive weakness. Due to the weakness the patient was rehospitalized on 8/15 with a repeat cervical MRI which showed C6 osteophyte with left spinal cord compression. Dr Yevette Edwards consulted and offered a second opinion, who favored watching and waiting. Patient went to skilled nursing facility, but was unable to continue rehabilitation due to progressive weakness. Patient was admitted yesterday for chest pain and an elevated troponin which is gradually decreased. She went for echo today. Cardiology is consulted [place] and requested that we take over care. On exam: The patient is weak globally left greater than right. Her left upper and lower extremities are 2 out of 5 in strength with her right upper and lower extremity 3 out of 5. She has also centrally weak and is unable to sit up well. Her cough is very weak. She did have a CTA yesterday that showed bronchial plugging. Orthopedics consulted: Dr. Aundria Rud to see patient. Will order MRI of head and neck. Respiratory therapy consulted. We'll send care of the patient as primary.  Candelaria Celeste, DO Attending Physician [facility]

## 2016-04-17 NOTE — Progress Notes (Signed)
  Echocardiogram 2D Echocardiogram has been performed.  [name] 04/17/2016, 3:44 PM

## 2016-04-17 NOTE — Progress Notes (Signed)
ANTICOAGULATION CONSULT NOTE  Pharmacy Consult for heparin Indication: chest pain/ACS  No Known Allergies  Patient Measurements: Height: 5\' 6"  (167.6 cm) Weight: 116 lb 1.6 oz (52.7 kg) IBW/kg (Calculated) : 59.3  Vital Signs: Temp: 98.2 F (36.8 C) (11/18 0505) Temp Source: Oral (11/18 0505) BP: 105/60 (11/18 0505) Pulse Rate: 77 (11/18 1151)   Medical History: Past Medical History:  Diagnosis Date  . Abnormal stress test 01/03/2013   false positive by Dr. Allyson SabalBerry  . Allergy   . Arthritis    degenerative lumbar spine   . Asthma   . CAD (coronary artery disease) 01/30/2013   single vessel disease of small second diagonal branch, normal EF 65%  . COPD (chronic obstructive pulmonary disease) (HCC)    reviewed by Dr. Sherene SiresWert  . Depression   . Emphysema lung (HCC)   . Fall 01/13/2016  . H/O cardiovascular stress test 2014  . Hyperlipidemia   . Hypertension   . Neuropathy (HCC)   . Right bundle branch block   . Tobacco use    has tried to stop numerous times    Assessment: 65yo female who continues on heparin for NSTEMI. Patient remains therapeutic on heparin at 600 units/h. CBC wnl, no bleeding documented.  Goal of Therapy:  Heparin level 0.3-0.7 units/ml Monitor platelets by anticoagulation protocol: Yes   Plan:  Heparin IV at 600 units/hr Daily heparin level/CBC Monitor s/sx bleeding  Estella HuskMichelle Merryn Thaker, Pharm.D., BCPS, AAHIVP Clinical Pharmacist Phone: [phone] or [phone] 04/17/2016, 12:06 PM

## 2016-04-17 NOTE — Progress Notes (Signed)
Patient Name: Tara Bottcheratricia A Busta Date of Encounter: 04/17/2016  [facility]  Hospital Problem List     Principal Problem:   NSTEMI (non-ST elevated myocardial infarction) [facility](HCC) Active Problems:   Essential hypertension   CAD (coronary artery disease)   Spondylosis, cervical, with myelopathy    Subjective   Denies any chest pain but having some SOB  Inpatient Medications    Scheduled Meds: . aspirin EC  81 mg Oral Daily  . darifenacin  7.5 mg Oral Daily  . simvastatin  20 mg Oral q1800   And  . ezetimibe  10 mg Oral q1800  . loratadine  10 mg Oral Daily  . mouth rinse  15 mL Mouth Rinse BID  . mometasone-formoterol  2 puff Inhalation BID  . naproxen sodium  275 mg Oral BID WC  . polyethylene glycol  17 g Oral Daily  . sertraline  50 mg Oral Daily   Continuous Infusions: . sodium chloride Stopped (04/17/16 1215)  . heparin Stopped (04/17/16 1215)   PRN Meds: acetaminophen, albuterol, nitroGLYCERIN, ondansetron (ZOFRAN) IV, zolpidem   Vital Signs    Vitals:   04/17/16 0505 04/17/16 0728 04/17/16 1143 04/17/16 1151  BP: 105/60     Pulse: 76  86 77  Resp: 18     Temp: 98.2 F (36.8 C)     TempSrc: Oral     SpO2: 98% 99% 90% 98%  Weight:      Height:        Intake/Output Summary (Last 24 hours) at 04/17/16 1216 Last data filed at 04/17/16 1211  Gross per 24 hour  Intake             59.2 ml  Output             1000 ml  Net           -940.8 ml   Filed Weights   04/16/16 0439  Weight: 116 lb 1.6 oz (52.7 kg)    Physical Exam    GEN: Well nourished, well developed, in no acute distress.  HEENT: Grossly normal.  Neck: Supple, no JVD, carotid bruits, or masses. Cardiac: RRR, no murmurs, rubs, or gallops. No clubbing, cyanosis, trace-1+ pedal edema.  Radials/DP/PT 2+ and equal bilaterally.  Respiratory:  Respirations regular and unlabored, clear to auscultation bilaterally. GI: Soft, nontender, nondistended, BS + x 4. Skin: warm  and dry, no rash. Neuro:  Strength and sensation are at baseline. Psych: AAOx3.  Normal affect.  Labs    CBC  Recent Labs  04/16/16 0654 04/17/16 0342  WBC 7.8 7.5  NEUTROABS 5.3  --   HGB 14.8 14.3  HCT 46.0 45.7  MCV 97.5 99.8  PLT 274 259   Basic Metabolic Panel  Recent Labs  04/16/16 0654  NA 142  K 4.1  CL 101  CO2 32  GLUCOSE 95  BUN 17  CREATININE 0.51  CALCIUM 9.7    Telemetry    SR, occ ST - Personally Reviewed  ECG    SR, RBBB is old - Personally Reviewed  Radiology    Ct Angio Chest Pe W Or Wo Contrast Result Date: 04/16/2016 CLINICAL DATA:  Pt got choked yesterday while eating; Heimlich maneuver done, very difficult to get food dislodged; pt had onset of midline chest pain, increased SOB after episode. Pt's chest pain has subsided, and SOB has improved; 70ml Isovue.*comment was truncated* EXAM: CT ANGIOGRAPHY CHEST WITH CONTRAST TECHNIQUE: Multidetector CT imaging of the chest  was performed using the standard protocol during bolus administration of intravenous contrast. Multiplanar CT image reconstructions and MIPs were obtained to evaluate the vascular anatomy. CONTRAST:  70 mL Isovue COMPARISON:  Chest CT 5 8 17  FINDINGS: Cardiovascular: No filling defects within pulmonary suggest acute pulmonary embolism. No acute findings of the aorta great vessels. Minimal intimal calcification of the aortic arch. Coronary artery calcification. Mediastinum/Nodes: No axillary or supraclavicular adenopathy. Mediastinal hilar adenopathy. Esophagus normal. Lungs/Pleura: Centrilobular emphysema in the upper lobes. No aspiration pneumonitis. Minimal basilar atelectasis from airways normal. A single focus of mucous plugging is noted in the LEFT lower lobe (image 54, series 6). Upper Abdomen: Limited view of the liver, kidneys, pancreas are unremarkable. Normal adrenal glands. No aggressive osseous lesion. Anterior cervical fusion noted Musculoskeletal: Anterior cervical fusion.  No acute osseous abnormality. Review of the MIP images confirms the above findings. IMPRESSION: 1. No evidence acute pulmonary embolism. 2. No aspiration pneumonitis or airway foreign body. 3. Small Focus of mucous plugging in the LEFT lower lobe. 4. Coronary artery calcification and aortic atherosclerotic calcification. 5. Upper lobe central lobular emphysema. Electronically Signed   By: Genevive Bi M.D.   On: 04/16/2016 10:17    Cardiac Studies   Echo ordered  Patient Profile      65 y.o. female with PMH of small 95% D2 rx medically, hypertension, COPD, cervical spondylosis with myelopathy, and prior tobacco abuse, admitted 11/16 for chest pain and shortness of breath.    Assessment & Plan    1. Chest pain: - Unclear etiology of chest pain in the setting of mildly elevated troponin level.  She has COPD and has been having a lot of mucous production and was coughing and felt like something was stuck in her airway.  She coughed hard and then started having chest pressure and SOB.  This started to radiate down her arms and went to the ER were Trop was mildly +.  She has not had any further CP since admission.  EKG with NSR, RBBB and inferior infarct unchange from 12/2014.  Suspect that mild Trop bump related to demand ischemia instead of true NSTEMI as cath 4 years ago with no CAD except diag that was small and not amenable to PCI.   - CTA chest was negative for PE but did show mucous plugging in the LLL. - Low suspicion for myopericarditis but sx did get better after ASA.  ECG w/ RBBB (old)  - On heparin drip. - Aspirin daily for now.  - Echo pending and will be done today  2. Coronary artery disease: Patient with known 95% D2, not amenable to intervention and o/w normal coronary arteries at time of cath 2014. - Now with chest pain and mildly elevated trop but doubt ACS - BP too low for beta-blocker but is on Vytorin and aspirin. - Continue home medications at this time. - Further work  up for chest pain as above.    3.  COPD: - Patient with a history of COPD.  She does have shortness of breath but without wheezing on examination. Chest CT this admission showed mucous plugging in the LLL. - Continue home albuterol as needed and advair.   - I am concerned that her progressive myelopathy is now affecting her lungs.  She has found it more difficult to have the strength to cough and is worried it is affecting her diaphragm.  May need Neuro consult but will leave to [facility]. - Will ask IM to see and take  on their service as I think her this is not a primary cardiac issue and is more related to her underlying COPD and worsening myelopathy and weakness.  4. Myelopathy: -  Patient with known myelopathy.  She is bed bound due to profound muscle weakness that has progressed over the past few months according to her husband.   - Foley cath is leaking and patient does not want to take Enablex. - Per [facility]  Signed, Armanda Magicraci Arnie Maiolo, MD  04/17/2016, 12:16 PM

## 2016-04-17 NOTE — Progress Notes (Signed)
Spoke with Dr. Cornelius Moraswen about family desire for neurology consult.  Dr. Cornelius Moraswen stated that this would best be addressed by Dr. Orson AloeHenderson on Monday morning.  Pt's foley is continuing to leak even though night RN's balloon adjustment was unsuccessful.  Informed Dr. Cornelius Moraswen of plan to change foley to 16 french.  Pt resting comfortably.  Will continue to monitor.  Barrie LymeVance, Tag Wurtz E RN 11:38 AM  04/17/2016

## 2016-04-17 NOTE — Consult Note (Signed)
ORTHOPAEDIC CONSULTATION  REQUESTING PHYSICIAN: Levie HeritageJacob J Stinson, DO  PCP:  Rogelia BogaKWIATKOWSKI,PETER FRANK, MD  Chief Complaint: Progressive weakness  HPI: Tara Cruz is a 65 y.o. female who complains of  Progressive weakness in LUE > RUE and BLE with increased SOB.  Tara Cruz  is a pleasant pt of our practice and specifically of Dr. Shon BatonBrooks with hx of Anterior cervical decompression and fusion C4-7 with C6 corpectomy.  She was found to have extensive scaring and high risk for SCI at time of the decompression and does have some residual stenosis noted on post op MRI at the C6 caudal segment.  Since the surgery she has had second opinions from multiple spine surgeons and a local neurologist to help explain her advancing weakness.  Per the patient no one can explain her symptoms and she is fearful that she is just going to "sit around and wait to die."    Her CC at arrival to the hospital currently was for advancing SOB with dyspnea and hypoxia into the 70s.  She was admitted for NSTEMI with what appears to be demand ischemia.  She does not have any new neurologic symptoms per se, but does complain of progression over the last 2 months of her prior ones.  Past Medical History:  Diagnosis Date  . Abnormal stress test 01/03/2013   false positive by Dr. Allyson SabalBerry  . Allergy   . Arthritis    degenerative lumbar spine   . Asthma   . CAD (coronary artery disease) 01/30/2013   single vessel disease of small second diagonal branch, normal EF 65%  . COPD (chronic obstructive pulmonary disease) (HCC)    reviewed by Dr. Sherene SiresWert  . Depression   . Emphysema lung (HCC)   . Fall 01/13/2016  . H/O cardiovascular stress test 2014  . Hyperlipidemia   . Hypertension   . Neuropathy (HCC)   . Right bundle branch block   . Tobacco use    has tried to stop numerous times   Past Surgical History:  Procedure Laterality Date  . ABDOMINAL HYSTERECTOMY  1978  . ANTERIOR CERVICAL DECOMP/DISCECTOMY FUSION  N/A 11/06/2015   Procedure: ANTERIOR CERVICAL DISCECTOMY  C4-5, C6 CORPECTOMY, FUSION C4-7;  Surgeon: Venita Lickahari Brooks, MD;  Location: [facility] OR;  Service: Orthopedics;  Laterality: N/A;  . BACK SURGERY     12-2014   DR. BROOKS  . CARDIAC CATHETERIZATION  01/30/2013   2nd small diag branch with 95% stentosis, too small for intervention.  No disease in other vessels.  Marland Kitchen. EYE SURGERY     cataracts removed, /w IOL- bilateral, yag laser  . FOOT SURGERY     for plantar fascitis BILAT  . HAND SURGERY Left    2015  . LEFT HEART CATHETERIZATION WITH CORONARY ANGIOGRAM N/A 01/30/2013   Procedure: LEFT HEART CATHETERIZATION WITH CORONARY ANGIOGRAM;  Surgeon: Runell GessJonathan J Berry, MD;  [facility];  Service: Cardiovascular;  Laterality: N/A;  . TUBAL LIGATION     Social History   Social History  . Marital status: Significant Other    Spouse name: N/A  . Number of children: 2  . Years of education: 13   Occupational History  . Retired [employer]    retired   Social History Main Topics  . Smoking status: Current Every Day Smoker    Packs/day: 0.25    Years: 50.00    Types: Cigarettes  . Smokeless tobacco: Never Used  . Alcohol use Yes  Comment: wine- on weekends   . Drug use: No  . Sexual activity: Yes   Other Topics Concern  . Not on file   Social History Narrative   Lives with fiancee   Caffeine use- coffee on weekend, 1 cup   Family History  Problem Relation Age of Onset  . Coronary artery disease Mother   . Alzheimer's disease Mother   . Rheum arthritis Mother   . Arthritis Mother   . Coronary artery disease Father   . Breast cancer Sister   . Asthma Sister   . Coronary artery disease Sister   . Hypertension Sister   . Arthritis Sister   . Coronary artery disease Maternal Grandfather   . Arthritis Paternal Grandmother   . Coronary artery disease Paternal Grandfather   . Breast cancer Cousin    No Known Allergies Prior to Admission medications   Medication Sig  Start Date End Date Taking? Authorizing Provider  albuterol (PROVENTIL HFA;VENTOLIN HFA) 108 (90 Base) MCG/ACT inhaler Inhale 2 puffs into the lungs every 4 (four) hours as needed for wheezing or shortness of breath.    Yes Historical Provider, MD  Aspirin (ECOTRIN PO) Take 81 mg by mouth daily.   Yes Historical Provider, MD  celecoxib (CELEBREX) 200 MG capsule Take 200 mg by mouth daily.   Yes Historical Provider, MD  cetirizine (ZYRTEC) 10 MG tablet Take 10 mg by mouth daily.   Yes Historical Provider, MD  estradiol (ESTRACE) 0.5 MG tablet Take 0.5 mg by mouth daily.   Yes Historical Provider, MD  ezetimibe-simvastatin (VYTORIN) 10-20 MG tablet Take 1 tablet by mouth daily.   Yes Historical Provider, MD  naproxen sodium (ANAPROX) 220 MG tablet Take 220 mg by mouth 2 (two) times daily as needed (for pain or headache).    Yes Historical Provider, MD  polyethylene glycol (MIRALAX / GLYCOLAX) packet Take 17 g by mouth daily as needed for moderate constipation.    Yes Historical Provider, MD  sertraline (ZOLOFT) 50 MG tablet Take 50 mg by mouth daily.   Yes Historical Provider, MD  solifenacin (VESICARE) 5 MG tablet Take 5 mg by mouth daily.   Yes Historical Provider, MD  Fluticasone-Salmeterol (ADVAIR) 250-50 MCG/DOSE AEPB Inhale 1 puff into the lungs 2 (two) times daily as needed (For shortness of breath.).    Historical Provider, MD   Ct Angio Chest Pe W Or Wo Contrast  Result Date: 04/16/2016 CLINICAL DATA:  Pt got choked yesterday while eating; Heimlich maneuver done, very difficult to get food dislodged; pt had onset of midline chest pain, increased SOB after episode. Pt's chest pain has subsided, and SOB has improved; 70ml Isovue.*comment was truncated* EXAM: CT ANGIOGRAPHY CHEST WITH CONTRAST TECHNIQUE: Multidetector CT imaging of the chest was performed using the standard protocol during bolus administration of intravenous contrast. Multiplanar CT image reconstructions and MIPs were obtained to  evaluate the vascular anatomy. CONTRAST:  70 mL Isovue COMPARISON:  Chest CT 5 8 17  FINDINGS: Cardiovascular: No filling defects within pulmonary suggest acute pulmonary embolism. No acute findings of the aorta great vessels. Minimal intimal calcification of the aortic arch. Coronary artery calcification. Mediastinum/Nodes: No axillary or supraclavicular adenopathy. Mediastinal hilar adenopathy. Esophagus normal. Lungs/Pleura: Centrilobular emphysema in the upper lobes. No aspiration pneumonitis. Minimal basilar atelectasis from airways normal. A single focus of mucous plugging is noted in the LEFT lower lobe (image 54, series 6). Upper Abdomen: Limited view of the liver, kidneys, pancreas are unremarkable. Normal adrenal glands. No aggressive osseous  lesion. Anterior cervical fusion noted Musculoskeletal: Anterior cervical fusion. No acute osseous abnormality. Review of the MIP images confirms the above findings. IMPRESSION: 1. No evidence acute pulmonary embolism. 2. No aspiration pneumonitis or airway foreign body. 3. Small Focus of mucous plugging in the LEFT lower lobe. 4. Coronary artery calcification and aortic atherosclerotic calcification. 5. Upper lobe central lobular emphysema. Electronically Signed   By: Genevive BiStewart  Edmunds M.D.   On: 04/16/2016 10:17    Positive ROS: All other systems have been reviewed and were otherwise negative with the exception of those mentioned in the HPI and as above.  Physical Exam: General: Alert, no acute distress Cardiovascular: No pedal edema Respiratory: No cyanosis, no use of accessory musculature GI: No organomegaly, abdomen is soft and non-tender Skin: No lesions in the area of chief complaint Neurologic: Sensation intact distally Psychiatric: Patient is competent for consent with normal mood and affect Lymphatic: No axillary or cervical lymphadenopathy  MUSCULOSKELETAL/Spine exam  RUE- +SILT C5-T1 globally 4/5 C5-T1 negative Hoffmans LUE- +SILT C5-T1 3/5  C8, T1 2/5 C5,6,7, negative hoffmans  LLE- +SILT L2-S1 minus L5 which is diminished, 0/5 motor throughout L2-S1 1+ patellar reflex RLE- +SILT L2-S1,3/5 L2-L5, 4/5 S1, 1+ patellar reflex  negative Babinski bilaterally   Assessment: Progressive neurologic weakness with severe cervical spinal stenosis  Plan: -persistent severe stenosis and cord compression noted similar to prior MRI from July but with some increased myelomalacia.  Brain MRI appears normal. -no surgical recommendations at this time -consider neurology consult to assess etiology of the progressive SOB  -I have discussed this case with Dr. Shon BatonBrooks and he will see patient as well on Monday if she is still here    Yolonda KidaJason Patrick Brownie Nehme, MD Cell [phone]    04/17/2016 7:07 PM

## 2016-04-17 NOTE — Progress Notes (Signed)
Spoke with patient's fiance's son Dr. [place] [phone].  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  Pt coughs at home but has difficulty.  Dr. Mayford KnifeWilliams is unsure if this difficulty was secondary to her COPD or her diaphragm becoming more weak. He is concerned her neurologic progression is getting worse.  He said his father expressed concerns and difficulty with transportation to neuro appointments from home.  Dr. Mayford KnifeWilliams is unsure of her insurance status and possibility of using insurance for transportation.  RN placed consult for social work and case management to [employer] insurance and transportation concerns while pt in community.  Dr. Mayford KnifeWilliams would like to see if pt could get neurology to see her while inpatient.  RN spoke with Dr. Cornelius Moraswen who said he was rounding on patients and would not be able to speak with Dr. Mayford KnifeWilliams at this time.  RN informed Dr. Mayford KnifeWilliams to speak with Dr. Orson AloeHenderson (primary) on Monday morning.  RN provided Dr. Mayford KnifeWilliams with the phone number of the floor [phone].  RN will communicate to the RN team to tell Dr. Orson AloeHenderson on Monday morning to call Dr. Mayford KnifeWilliams if possible.  Pt resting comfortably in room.  Stable.  NSR on telemetry.  Will continue to monitor.  Barrie LymeVance, Jariana Shumard E RN 10:12 AM 04/17/2016

## 2016-04-17 NOTE — Progress Notes (Signed)
Pt's foley was leaking overnight and this morning.  Night RN filled balloon with 4 ml saline twice to get leaking to stop.  Foley was still leaking this morning.  Day shift RN, Lillia AbedLindsay checked total amount of saline in baloon.  Balloon had 18 ml.  Lillia AbedLindsay RN removed 8 ml and left 10 ml saline in balloon.  Lillia AbedLindsay received order to change foley from 14 french to 16 french if it continue to leak.  Lillia AbedLindsay RN checked at this time and it was not leaking. Last time patient leaked was 1100 today.  Pt resting comfortably.  Will continue to monitor.  Barrie LymeVance, Xyla Leisner E RN 4:35 PM 04/17/2016

## 2016-04-18 ENCOUNTER — Encounter (HOSPITAL_COMMUNITY): Payer: Self-pay | Admitting: *Deleted

## 2016-04-18 ENCOUNTER — Inpatient Hospital Stay (HOSPITAL_COMMUNITY): Payer: Medicare Other

## 2016-04-18 DIAGNOSIS — G9520 Unspecified cord compression: Secondary | ICD-10-CM

## 2016-04-18 DIAGNOSIS — I2510 Atherosclerotic heart disease of native coronary artery without angina pectoris: Secondary | ICD-10-CM

## 2016-04-18 DIAGNOSIS — M4712 Other spondylosis with myelopathy, cervical region: Secondary | ICD-10-CM

## 2016-04-18 DIAGNOSIS — I214 Non-ST elevation (NSTEMI) myocardial infarction: Secondary | ICD-10-CM

## 2016-04-18 DIAGNOSIS — I10 Essential (primary) hypertension: Secondary | ICD-10-CM

## 2016-04-18 LAB — CBC
HCT: 44.1 % (ref 36.0–46.0)
HEMOGLOBIN: 13.9 g/dL (ref 12.0–15.0)
MCH: 31.2 pg (ref 26.0–34.0)
MCHC: 31.5 g/dL (ref 30.0–36.0)
MCV: 98.9 fL (ref 78.0–100.0)
Platelets: 266 10*3/uL (ref 150–400)
RBC: 4.46 MIL/uL (ref 3.87–5.11)
RDW: 13.1 % (ref 11.5–15.5)
WBC: 7.6 10*3/uL (ref 4.0–10.5)

## 2016-04-18 MED ORDER — ALUM & MAG HYDROXIDE-SIMETH 200-200-20 MG/5ML PO SUSP
30.0000 mL | Freq: Four times a day (QID) | ORAL | Status: DC | PRN
  Administered 2016-04-18: 30 mL via ORAL
  Filled 2016-04-18: qty 30

## 2016-04-18 MED ORDER — IPRATROPIUM-ALBUTEROL 0.5-2.5 (3) MG/3ML IN SOLN
3.0000 mL | Freq: Two times a day (BID) | RESPIRATORY_TRACT | Status: DC
  Administered 2016-04-18 – 2016-04-21 (×6): 3 mL via RESPIRATORY_TRACT
  Filled 2016-04-18 (×7): qty 3

## 2016-04-18 MED ORDER — PANTOPRAZOLE SODIUM 40 MG PO TBEC
40.0000 mg | DELAYED_RELEASE_TABLET | Freq: Every day | ORAL | Status: DC
  Administered 2016-04-18 – 2016-04-21 (×4): 40 mg via ORAL
  Filled 2016-04-18 (×4): qty 1

## 2016-04-18 MED ORDER — IPRATROPIUM-ALBUTEROL 0.5-2.5 (3) MG/3ML IN SOLN
3.0000 mL | RESPIRATORY_TRACT | Status: DC
  Administered 2016-04-18 (×2): 3 mL via RESPIRATORY_TRACT
  Filled 2016-04-18 (×3): qty 3

## 2016-04-18 NOTE — Progress Notes (Signed)
PT Cancellation Note  Patient Details Name: Ledora Bottcheratricia A Salameh MRN: [id number] DOB: 1950-10-05   Cancelled Treatment:    Reason Eval/Treat Not Completed: PT screened, no needs identified, will sign off (pt total assist at baseline with hoyer lift, hospital bed and 24 hr care. Pt states she is at baseline functional status and not appropriate to acute therapy at this time. Will sign off with pt aware and agreeable with nursing to continued routine positioning)   Linzy Laury B Lamyiah Crawshaw 04/18/2016, 8:46 AM  Delaney MeigsMaija Tabor Edra Riccardi, PT [phone]

## 2016-04-18 NOTE — Progress Notes (Signed)
04/18/2016 11:24 AM  I spoke with neurologist on call, they reviewed her MRI films and said that they didn't think there was anything further they could offer in care of patient in that her issues are primarily surgical.    Tara Cruz. Nao Linz, MD

## 2016-04-18 NOTE — Progress Notes (Signed)
2D echo showed normal LVF with no RWMAs.  I suspect that her elevated trop was more related to acute respiratory event with severe coughing and mucous plugging.  Trop elevation minimal and flat trend.  Given her progressive neurological disorder with progression of myelopathy and now affecting her breathing, I don't think that proceeding with nuclear stress testing and cath if needed is appropriate at this time.  Will await Neurology consult but if deemed to have a terminal neurologic illness would not proceed with further cardiac testing.

## 2016-04-18 NOTE — Progress Notes (Signed)
Q 2hrs turn done. Physical and emotional needs provided.

## 2016-04-18 NOTE — Clinical Social Work Note (Signed)
Clinical Social Work Assessment  Patient Details  Name: Tara Cruz MRN: [id number] Date of Birth: Jul 28, 1950  Date of referral:  04/18/16               Reason for consult:  Transportation, Discharge Planning                Permission sought to share information with:  Case Manager, Family Supports Permission granted to share information::  Yes, Verbal Permission Granted  Name::        Agency::     Relationship::  Son:  Chiropodist Information:     Housing/Transportation Living arrangements for the past 2 months:  Single Family Home Source of Information:  Medical Team, Adult Children Patient Interpreter Needed:  None Criminal Activity/Legal Involvement Pertinent to Current Situation/Hospitalization:  No - Comment as needed Significant Relationships:  Adult Children, Other Family Members, Spouse Lives with:  Spouse Do you feel safe going back to the place where you live?  Yes Need for family participation in patient care:  Yes (Comment)  Care giving concerns:  LCSW spoke with patient's husband's son via phone regarding questions about transportation due to his father having to pay out of pocket for transportation each time patient has an MD outpatient appointment. Per son, father is having to call EMS for patient to transport as she is unable to sit up and appears to be a parapalegic.  Reports he is paying close to a thousand dollars each time as [place] in [place] and the drive is so long. Son reports prior to this admission, patient was in [facility] for rehab and was able to walk in a walker (august of 2017).  Since then she returned home, declined rapidly of unknown origin and needs 24 hour care that husband is providing and caregivers are providing for patient. Son reports he is a Development worker, international aid in [place] and does assist father with questions and attempts to help father understand baseline of patient.  He is involved, but remotely. Reports  patient use to work at [employer] and since retired. She was independent and with son's father for over 10 years. He is aware that patient has a pending neuro consult and still pending medical work up to understand severe decline in physical.   Social Worker assessment / plan:  LCSW completed assessment over phone and answered questions for son regarding transport. Explained most agencies are for wheel chair or disabled transport provided by medicaid.  Patient does not qualify for medicaid as she owns land and has a retirement and most likely makes too much. Reports that ambulance may be only options due to need for stretcher and educated son that Medicare does not cover outpatient appointments that it would be all out of pocket. Discussed if patient could begin sitting up or able to transport in a wheelchair then there would be other options and LCSW and provide information.  Will follow up with husband in case he has any additional questions. Currently no other needs or questions at this time. Patient still pending medical workup. Available if other needs arise.  Employment status:  Retired, Disabled (Comment on whether or not currently receiving Disability) Insurance information:  Medicare PT Recommendations:  No Follow Up (not approrpiate for PT per notes) Information / Referral to community resources:  Other (Comment Required) (transportation)  Patient/Family's Response to care:  Appreciative of information and time.  Patient/Family's Understanding of and Emotional Response to Diagnosis, Current  Treatment, and Prognosis:  Unclear at this time per son. Reports he is aware etiology of patient's decline is still being worked up.  Emotional Assessment Appearance:  Appears stated age Attitude/Demeanor/Rapport:  Other (cooperative) Affect (typically observed):  Accepting, Adaptable Orientation:  Oriented to Self, Oriented to Place, Oriented to  Time, Oriented to Situation Alcohol /  Substance use:  Not Applicable Psych involvement (Current and /or in the community):  No (Comment)  Discharge Needs  Concerns to be addressed:  Adjustment to Illness, Care Coordination, Other (Comment Required (transportation to appointments) Readmission within the last 30 days:  No Current discharge risk:  None Barriers to Discharge:  Continued Medical Work up   Raye SorrowCoble, Alla Sloma N, LCSW 04/18/2016, 10:54 AM

## 2016-04-18 NOTE — Progress Notes (Signed)
Q 2 turns continues done. Denies any thing at this time.

## 2016-04-18 NOTE — Progress Notes (Signed)
Patient continues on Q2 turns. Will continue to monitor. Needs met. Water given per request.

## 2016-04-18 NOTE — Progress Notes (Signed)
Q 2 hours turns continues. Will continue to monitor.

## 2016-04-18 NOTE — Progress Notes (Signed)
PROGRESS NOTE    Tara Cruz  ZOX:[id number]  DOB: 01/26/1951  DOA: 04/16/2016 PCP: Rogelia Boga, MD Outpatient Specialists:  Hospital course: Tara Cruz is an 65 y.o. female  Tara Cruz is an 65 y.o. female with multiple medical issues including CAD with 95 % Dx2 treated medically, RBBB, HTN, COPD, and progressive myelopathy with profound muscle weakness, admitted on 11/16 for the management of chest pain. She has been increasing her mucous production without hemoptysis. Denies fevers, chills, night sweats or mucositis. Reports fevers up to , chills and night sweats. Denies any chest pain, chest wall pain or palpitations.Denies any sick contacts or recent long distant travels. Denies any abdominal pain. Has decreased appetite due to current symptoms.  Reports nausea without vomiting. No dysphagia. Denies dizziness or vertigo. Denies lower extremity swelling at this time . No confusion.  Denies any vision changes, double vision or headaches.   Assessment & Plan:   Acute on chronic respiratory failure - CT angio chest negative for PE, no aspiration pneumonitis or airway foreign body, small L mucous plug in the LLL. Upper centrolobullar emphysema . WBC normal.   chest x-ray with bilateral pleural effusions among cardiomegaly.  Patient has a history of cervical spondylosis with myelopathy, with likely progression of the disease.  Treating COPD with nebs.    Essential Hypertension - blood pressures have been well controlled.  CAD - appreciate cardiology assistance.  Echo: - Normal LV systolic function; grade 1 diastolic dysfunction.  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  Ortho recommending neurology consult. Will consult neuro hospitalist  service, ordered CT C spine 11/19.  Dr. Shon Baton to see patient tomorrow.    DVT prophylaxis: heparin Code Status: FULL Family Communication: patient Disposition Plan: TBD  Consultants:  Cardiology  Orthopedics  neurohospitalists  Subjective: Pt reports that her breathing has not been good this morning.  Asking for breathing treatment.   Objective: Vitals:   04/17/16 1320 04/17/16 1951 04/17/16 2046 04/18/16 0400  BP: 124/73 120/66  (!) 114/55  Pulse: 76 84  76  Resp: 16 16  18   Temp: 97.9 F (36.6 C) 98 F (36.7 C)  97.7 F (36.5 C)  TempSrc: Oral Oral  Oral  SpO2: 96% 96% 96% 96%  Weight:      Height:        Intake/Output Summary (Last 24 hours) at 04/18/16 0801 Last data filed at 04/18/16 0403  Gross per 24 hour  Intake           201.17 ml  Output              625 ml  Net          -423.83 ml   Filed Weights   04/16/16 0439  Weight: 52.7 kg (116 lb 1.6 oz)    Exam:  Constitutional: Appears calm,  alert and awake, oriented x3, not in any acute distress.  Very thin, chronically ill appearing. Eyes: PERLA, EOMI, irises appear normal, anicteric sclera,  ENMT: external ears and nose appear normal, normal hearing or hard of hearin. Lips appears normal, oropharynx mucosa, tongue, posterior pharynx appear normal  Neck: neck appears normal, no masses, normal ROM, no thyromegaly, no JVD  CVS: S1-S2 clear, no murmur rubs or gallops, no LE edema, normal pedal pulses  Respiratory: shallow breath sounds bilateral.  Abdomen: soft nontender, nondistended, normal bowel sounds, no hepatosplenomegaly, no hernias  Musculoskeletal: no cyanosis, clubbing or edema noted bilaterally.  Some muscle wasting noted at the upper extremities  Neuro:remarkable for profound weakness in her upper extremities and lower extremities  Psych: judgement and insight appear normal, stable mood and affect, mental status Skin: no rashes or lesions or ulcers, no induration or nodules   Data  Reviewed: Basic Metabolic Panel:  Recent Labs Lab 04/16/16 0654  NA 142  K 4.1  CL 101  CO2 32  GLUCOSE 95  BUN 17  CREATININE 0.51  CALCIUM 9.7   Liver Function Tests: No results for input(s): AST, ALT, ALKPHOS, BILITOT, PROT, ALBUMIN in the last 168 hours. No results for input(s): LIPASE, AMYLASE in the last 168 hours. No results for input(s): AMMONIA in the last 168 hours. CBC:  Recent Labs Lab 04/16/16 0654 04/17/16 0342 04/18/16 0445  WBC 7.8 7.5 7.6  NEUTROABS 5.3  --   --   HGB 14.8 14.3 13.9  HCT 46.0 45.7 44.1  MCV 97.5 99.8 98.9  PLT 274 259 266   Cardiac Enzymes:  Recent Labs Lab 04/16/16 1119 04/16/16 1715 04/16/16 2218  TROPONINI 0.60* 0.39* 0.26*   CBG (last 3)  No results for input(s): GLUCAP in the last 72 hours. No results found for this or any previous visit (from the past 240 hour(s)).   Studies: Ct Angio Chest Pe W Or Wo Contrast  Result Date: 04/16/2016 CLINICAL DATA:  Pt got choked yesterday while eating; Heimlich maneuver done, very difficult to get food dislodged; pt had onset of midline chest pain, increased SOB after episode. Pt's chest pain has subsided, and SOB has improved; 70ml Isovue.*comment was truncated* EXAM: CT ANGIOGRAPHY CHEST WITH CONTRAST TECHNIQUE: Multidetector CT imaging of the chest was performed using the standard protocol during bolus administration of intravenous contrast. Multiplanar CT image reconstructions and MIPs were obtained to evaluate the vascular anatomy. CONTRAST:  70 mL Isovue COMPARISON:  Chest CT 5 8 17  FINDINGS: Cardiovascular: No filling defects within pulmonary suggest acute pulmonary embolism. No acute findings of the aorta great vessels. Minimal intimal calcification of the aortic arch. Coronary artery calcification. Mediastinum/Nodes: No axillary or supraclavicular adenopathy. Mediastinal hilar adenopathy. Esophagus normal. Lungs/Pleura: Centrilobular emphysema in the upper lobes. No aspiration  pneumonitis. Minimal basilar atelectasis from airways normal. A single focus of mucous plugging is noted in the LEFT lower lobe (image 54, series 6). Upper Abdomen: Limited view of the liver, kidneys, pancreas are unremarkable. Normal adrenal glands. No aggressive osseous lesion. Anterior cervical fusion noted Musculoskeletal: Anterior cervical fusion. No acute osseous abnormality. Review of the MIP images confirms the above findings. IMPRESSION: 1. No evidence acute pulmonary embolism. 2. No aspiration pneumonitis or airway foreign body. 3. Small Focus of mucous plugging in the LEFT lower lobe. 4. Coronary artery calcification and aortic atherosclerotic calcification. 5. Upper lobe central lobular emphysema. Electronically Signed   By: Genevive BiStewart  Edmunds M.D.   On: 04/16/2016 10:17   Mr Laqueta JeanBrain W ZOWo Contrast  Result Date: 04/17/2016 CLINICAL DATA:  Progressive upper and lower extremity weakness over the past few months. Previous ACDF from C4 through C7 which included C6 corpectomy. EXAM: MRI HEAD WITHOUT AND WITH CONTRAST MRI CERVICAL SPINE WITHOUT AND WITH CONTRAST TECHNIQUE: Multiplanar, multiecho pulse sequences of the brain and surrounding structures, and cervical spine, to include the craniocervical junction and cervicothoracic junction, were obtained without and with intravenous contrast. CONTRAST:  11mL MULTIHANCE GADOBENATE DIMEGLUMINE 529 MG/ML IV SOLN  COMPARISON:  MRI of the cervical spine 12/29/2015. FINDINGS: MRI HEAD FINDINGS Brain: No evidence for acute infarction, hemorrhage, mass lesion, hydrocephalus, or extra-axial fluid. Normal for age cerebral volume. Minor white matter disease, not unexpected for age. Post infusion, no abnormal enhancement of the brain or meninges. Vascular: Flow voids are maintained throughout the carotid, basilar, and vertebral arteries. There are no areas of chronic hemorrhage. Skull and upper cervical spine: Normal marrow signal. Sinuses/Orbits: Negative. Other: None. MRI  CERVICAL SPINE FINDINGS Alignment: Difficult to assess, given the extensive hardware. The area I most unsure about is at the location of the C6 corpectomy strut, where a large spur projects dorsally. CT of the cervical spine without contrast is needed for further assessment. Vertebrae: Susceptibility artifact obscures most of the C4 through C7 vertebrae. Above and below this marrow signal is normal. There is a T2 hyperintense fluid collection, 5 x 3 x 15 mm (R-L x A-P x C-C), posterior to the C6 strut which is new/ increased from priors and is associated with posteriorly protruding bone into the ventral epidural space. Motion degraded post contrast imaging shows no worrisome enhancement. Cord: Severe cord compression at C6. Increasing T2 hyperintensity below the area of cord compression. AP diameter of the spinal canal at maximum compression measures 4 mm. Posterior Fossa, vertebral arteries, paraspinal tissues: No tonsillar herniation. Flow voids are maintained both vertebral arteries. No neck masses. Disc levels: C2-3:  Unremarkable. C3-4:  Unremarkable. C4-5:  Unremarkable appearing post fusion interspace. C5-6: Mild to moderate canal stenosis is developing at this level, due to posteriorly projecting bone or OPLL, with effacement anterior subarachnoid space and mild cord flattening. BILATERAL C6 foraminal narrowing worse on the RIGHT. Canal diameter 6-7 mm. C6-7: Posterior projecting inferior residua of C6 vertebral body, or possibly OPLL, significantly narrows the spinal canal and both neural foramina, worse on the LEFT. Severe cord compression. Increasing cord edema/myelomalacia. It is difficult to confirm that the corpectomy cage is appropriately located. C7-T1:  Unremarkable. IMPRESSION: Unremarkable MRI of the brain without and with contrast. Status post C4-C7 ACDF with C6 corpectomy. 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 Canal diameter 4 mm. Increasingly prominent 5 x 3 x 15 mm T2 hyperintense fluid collection dorsal to but within the surgical construct, uncertain significance. CT of the cervical spine without contrast recommended for further evaluation. Electronically Signed   By: Elsie StainJohn T Curnes M.D.   On: 04/17/2016 19:17   Mr Cervical Spine W Wo Contrast  Result Date: 04/17/2016 CLINICAL DATA:  Progressive upper and lower extremity weakness over the past few months. Previous ACDF from C4 through C7 which included C6 corpectomy. EXAM: MRI HEAD WITHOUT AND WITH CONTRAST MRI CERVICAL SPINE WITHOUT AND WITH CONTRAST TECHNIQUE: Multiplanar, multiecho pulse sequences of the brain and surrounding structures, and cervical spine, to include the craniocervical junction and cervicothoracic junction, were obtained without and with intravenous contrast. CONTRAST:  11mL MULTIHANCE GADOBENATE DIMEGLUMINE 529 MG/ML IV SOLN COMPARISON:  MRI of the cervical spine 12/29/2015. FINDINGS: MRI HEAD FINDINGS Brain: No evidence for acute infarction, hemorrhage, mass lesion, hydrocephalus, or extra-axial fluid. Normal for age cerebral volume. Minor white matter disease, not unexpected for age. Post infusion, no abnormal enhancement of the brain or meninges. Vascular: Flow voids are maintained throughout the carotid, basilar, and vertebral arteries. There are no areas of chronic hemorrhage. Skull and upper cervical  spine: Normal marrow signal. Sinuses/Orbits: Negative. Other: None. MRI CERVICAL SPINE FINDINGS Alignment: Difficult to assess, given the extensive hardware. The area I most unsure about is at the location of the C6 corpectomy strut, where a large spur projects dorsally. CT of the cervical spine without contrast is needed for further assessment. Vertebrae: Susceptibility artifact obscures most of the C4 through C7 vertebrae. Above and  below this marrow signal is normal. There is a T2 hyperintense fluid collection, 5 x 3 x 15 mm (R-L x A-P x C-C), posterior to the C6 strut which is new/ increased from priors and is associated with posteriorly protruding bone into the ventral epidural space. Motion degraded post contrast imaging shows no worrisome enhancement. Cord: Severe cord compression at C6. Increasing T2 hyperintensity below the area of cord compression. AP diameter of the spinal canal at maximum compression measures 4 mm. Posterior Fossa, vertebral arteries, paraspinal tissues: No tonsillar herniation. Flow voids are maintained both vertebral arteries. No neck masses. Disc levels: C2-3:  Unremarkable. C3-4:  Unremarkable. C4-5:  Unremarkable appearing post fusion interspace. C5-6: Mild to moderate canal stenosis is developing at this level, due to posteriorly projecting bone or OPLL, with effacement anterior subarachnoid space and mild cord flattening. BILATERAL C6 foraminal narrowing worse on the RIGHT. Canal diameter 6-7 mm. C6-7: Posterior projecting inferior residua of C6 vertebral body, or possibly OPLL, significantly narrows the spinal canal and both neural foramina, worse on the LEFT. Severe cord compression. Increasing cord edema/myelomalacia. It is difficult to confirm that the corpectomy cage is appropriately located. C7-T1:  Unremarkable. IMPRESSION: Unremarkable MRI of the brain without and with contrast. Status post C4-C7 ACDF with C6 corpectomy. 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 Canal diameter 4 mm. Increasingly prominent 5 x 3 x 15 mm T2 hyperintense fluid collection dorsal to but within the surgical construct, uncertain significance. CT of the cervical spine without contrast recommended for further evaluation. Electronically Signed   By:  Elsie Stain M.D.   On: 04/17/2016 19:17   Scheduled Meds: . aspirin EC  81 mg Oral Daily  . darifenacin  7.5 mg Oral Daily  . simvastatin  20 mg Oral q1800   And  . ezetimibe  10 mg Oral q1800  . guaiFENesin  600 mg Oral BID  . heparin subcutaneous  5,000 Units Subcutaneous Q8H  . loratadine  10 mg Oral Daily  . mouth rinse  15 mL Mouth Rinse BID  . mometasone-formoterol  2 puff Inhalation BID  . naproxen sodium  275 mg Oral BID WC  . polyethylene glycol  17 g Oral Daily  . sertraline  50 mg Oral Daily   Continuous Infusions: . sodium chloride Stopped (04/17/16 1630)   Principal Problem:   NSTEMI (non-ST elevated myocardial infarction) [facility]) Active Problems:   Essential hypertension   CAD (coronary artery disease)   Spondylosis, cervical, with myelopathy  Time spent:   Standley Dakins, MD, FAAFP [facility] Pager [phone]  If 7PM-7AM, please contact night-coverage [email or web address] Password [email or web address] 04/18/2016, 8:01 AM    LOS: 2 days

## 2016-04-19 DIAGNOSIS — E43 Unspecified severe protein-calorie malnutrition: Secondary | ICD-10-CM | POA: Insufficient documentation

## 2016-04-19 LAB — CBC
HEMATOCRIT: 43.6 % (ref 36.0–46.0)
Hemoglobin: 13.6 g/dL (ref 12.0–15.0)
MCH: 30.6 pg (ref 26.0–34.0)
MCHC: 31.2 g/dL (ref 30.0–36.0)
MCV: 98.2 fL (ref 78.0–100.0)
PLATELETS: 233 10*3/uL (ref 150–400)
RBC: 4.44 MIL/uL (ref 3.87–5.11)
RDW: 13 % (ref 11.5–15.5)
WBC: 9.3 10*3/uL (ref 4.0–10.5)

## 2016-04-19 MED ORDER — ENSURE ENLIVE PO LIQD
237.0000 mL | Freq: Three times a day (TID) | ORAL | Status: DC
  Administered 2016-04-19 – 2016-04-20 (×3): 237 mL via ORAL

## 2016-04-19 MED ORDER — BISACODYL 10 MG RE SUPP: 10.0000 mg | Freq: Every day | RECTAL | Status: DC | PRN

## 2016-04-19 NOTE — Consult Note (Signed)
Full note dictated Job number: [id number]

## 2016-04-19 NOTE — Progress Notes (Signed)
PROGRESS NOTE    Tara Cruz  ZOX:[id number]  DOB: [birth year]  DOA: 04/16/2016 PCP: Rogelia BogaKWIATKOWSKI,PETER FRANK, MD Outpatient Specialists:  Hospital course: Tara Bottcheratricia A Picardi is an 65 y.o. female  Tara Bottcheratricia A Beckett is an 65 y.o. female with multiple medical issues including CAD with 95 % Dx2 treated medically, RBBB, HTN, COPD, and progressive myelopathy with profound muscle weakness, admitted on 11/16 for the management of chest pain. She has been increasing her mucous production without hemoptysis. Denies fevers, chills, night sweats or mucositis. Reports fevers up to , chills and night sweats. Denies any chest pain, chest wall pain or palpitations.Denies any sick contacts or recent long distant travels. Denies any abdominal pain. Has decreased appetite due to current symptoms.  Reports nausea without vomiting. No dysphagia. Denies dizziness or vertigo. Denies lower extremity swelling at this time . No confusion.  Denies any vision changes, double vision or headaches.   Assessment & Plan:   Acute on chronic respiratory failure - CT angio chest negative for PE, no aspiration pneumonitis or airway foreign body, small L mucous plug in the LLL. Upper centrolobullar emphysema . WBC normal.   chest x-ray with bilateral pleural effusions among cardiomegaly.  Patient has a history of cervical spondylosis with myelopathy, with likely progression of the disease.  Treating COPD with neb and clinically improving, still requiring supplemental oxygen and still with shallow breathing.    Essential Hypertension - blood pressures have been well controlled.  CAD - appreciate cardiology assistance.  Echo: - Normal LV systolic function; grade 1 diastolic dysfunction.  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  Ortho recommending neurology consult. I spoke with  neurologist and they reviewed films but they felt like they had nothing to offer.   ordered CT C spine 11/19 - see below.  Dr. Shon BatonBrooks to see patient today.    DVT prophylaxis: heparin Code Status: FULL Family Communication: patient Disposition Plan: TBD  Consultants:  Cardiology  Orthopedics  neurohospitalists  Subjective: Pt reports that she feels very hot and asking for fan.   Objective: Vitals:   04/18/16 1658 04/18/16 1926 04/18/16 2056 04/19/16 0431  BP:   (!) 120/51 127/72  Pulse:   77 80  Resp:   18 18  Temp:   98.1 F (36.7 C) 98.2 F (36.8 C)  TempSrc:   Oral Oral  SpO2: 94% 96% 92% 94%  Weight:      Height:        Intake/Output Summary (Last 24 hours) at 04/19/16 0748 Last data filed at 04/18/16 1930  Gross per 24 hour  Intake                0 ml  Output              750 ml  Net             -750 ml   Filed Weights   04/16/16 0439  Weight: 52.7 kg (116 lb 1.6 oz)    Exam:  Constitutional: Appears calm,  alert and awake, oriented x3, not in any acute distress.  Very thin, chronically ill appearing. Eyes: PERLA, EOMI, irises appear normal, anicteric sclera,  ENMT: external ears and nose appear normal, normal hearing or hard of hearin. Lips appears normal, oropharynx mucosa, tongue, posterior pharynx appear normal  Neck: neck appears normal, no masses, normal ROM, no thyromegaly, no JVD  CVS: S1-S2 clear,  no murmur rubs or gallops, no LE edema, normal pedal pulses  Respiratory: shallow breathing but lungs clear to auscultation bilateral.  Abdomen: soft nontender, nondistended, normal bowel sounds, no hepatosplenomegaly, no hernias  Musculoskeletal: no cyanosis, clubbing or edema noted bilaterally.  Some muscle wasting noted at the upper extremities  Neuro:remarkable for profound weakness in her upper extremities and lower extremities  Psych: judgement and insight appear normal, stable mood and affect, mental status Skin: no rashes or lesions or ulcers,  no induration or nodules   Data Reviewed: Basic Metabolic Panel:  Recent Labs Lab 04/16/16 0654  NA 142  K 4.1  CL 101  CO2 32  GLUCOSE 95  BUN 17  CREATININE 0.51  CALCIUM 9.7   Liver Function Tests: No results for input(s): AST, ALT, ALKPHOS, BILITOT, PROT, ALBUMIN in the last 168 hours. No results for input(s): LIPASE, AMYLASE in the last 168 hours. No results for input(s): AMMONIA in the last 168 hours. CBC:  Recent Labs Lab 04/16/16 0654 04/17/16 0342 04/18/16 0445 04/19/16 0310  WBC 7.8 7.5 7.6 9.3  NEUTROABS 5.3  --   --   --   HGB 14.8 14.3 13.9 13.6  HCT 46.0 45.7 44.1 43.6  MCV 97.5 99.8 98.9 98.2  PLT 274 259 266 233   Cardiac Enzymes:  Recent Labs Lab 04/16/16 1119 04/16/16 1715 04/16/16 2218  TROPONINI 0.60* 0.39* 0.26*   CBG (last 3)  No results for input(s): GLUCAP in the last 72 hours. No results found for this or any previous visit (from the past 240 hour(s)).   Studies: Ct Cervical Spine Wo Contrast  Result Date: 04/18/2016 CLINICAL DATA:  Neck pain and bilateral arm weakness. History of cervical fusion in June, 2017. EXAM: CT CERVICAL SPINE WITHOUT CONTRAST TECHNIQUE: Multidetector CT imaging of the cervical spine was performed without intravenous contrast. Multiplanar CT image reconstructions were also generated. COMPARISON:  MRI cervical spine 04/17/2016. FINDINGS: Alignment: Straightening of lordosis is noted. Trace anterolisthesis C3 on C4 due to facet arthropathy is seen. Alignment is unchanged. Skull base and vertebrae: The patient is status post C3-7 fusion and C6 corpectomy. Hardware is intact without loosening. The C4-5 interbody spacer is incorporated into the endplates. There also appears to be incorporation of strut graft at C6. Small fluid collection posterior to the C6 strut seen on MRI is not visible on this exam. Soft tissues and spinal canal: Unremarkable. Disc levels: The appearance of intervertebral disc spaces is unchanged  since the comparison MRI. 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 Residual endplate spurring at C5-6 causes moderate central canal stenosis. Severe right and moderately severe left foraminal narrowing C5-6 is identified. Moderate to moderately severe bilateral foraminal narrowing C6-7 is worse on the left. Upper chest: There is some apical scarring.  Otherwise unremarkable. Other: None. IMPRESSION: No change in the appearance the cervical spine since the comparison MRI. Status post C4-7 fusion C6 corpectomy. Severe central canal stenosis at C6-7 and milder degree of central canal narrowing C5-6 is identified as described above. Bilateral foraminal narrowing at these levels is also unchanged in appearance. Electronically Signed   By: Drusilla Kanner M.D.   On: 04/18/2016 15:31   Mr Laqueta Jean OZ Contrast  Result Date: 04/17/2016 CLINICAL DATA:  Progressive upper and lower extremity weakness over the past few months. Previous ACDF from C4 through C7 which included C6 corpectomy. EXAM: MRI HEAD  WITHOUT AND WITH CONTRAST MRI CERVICAL SPINE WITHOUT AND WITH CONTRAST TECHNIQUE: Multiplanar, multiecho pulse sequences of the brain and surrounding structures, and cervical spine, to include the craniocervical junction and cervicothoracic junction, were obtained without and with intravenous contrast. CONTRAST:  11mL MULTIHANCE GADOBENATE DIMEGLUMINE 529 MG/ML IV SOLN COMPARISON:  MRI of the cervical spine 12/29/2015. FINDINGS: MRI HEAD FINDINGS Brain: No evidence for acute infarction, hemorrhage, mass lesion, hydrocephalus, or extra-axial fluid. Normal for age cerebral volume. Minor white matter disease, not unexpected for age. Post infusion, no abnormal enhancement of the brain or meninges. Vascular: Flow voids are maintained throughout the carotid, basilar, and vertebral arteries. There are no areas  of chronic hemorrhage. Skull and upper cervical spine: Normal marrow signal. Sinuses/Orbits: Negative. Other: None. MRI CERVICAL SPINE FINDINGS Alignment: Difficult to assess, given the extensive hardware. The area I most unsure about is at the location of the C6 corpectomy strut, where a large spur projects dorsally. CT of the cervical spine without contrast is needed for further assessment. Vertebrae: Susceptibility artifact obscures most of the C4 through C7 vertebrae. Above and below this marrow signal is normal. There is a T2 hyperintense fluid collection, 5 x 3 x 15 mm (R-L x A-P x C-C), posterior to the C6 strut which is new/ increased from priors and is associated with posteriorly protruding bone into the ventral epidural space. Motion degraded post contrast imaging shows no worrisome enhancement. Cord: Severe cord compression at C6. Increasing T2 hyperintensity below the area of cord compression. AP diameter of the spinal canal at maximum compression measures 4 mm. Posterior Fossa, vertebral arteries, paraspinal tissues: No tonsillar herniation. Flow voids are maintained both vertebral arteries. No neck masses. Disc levels: C2-3:  Unremarkable. C3-4:  Unremarkable. C4-5:  Unremarkable appearing post fusion interspace. C5-6: Mild to moderate canal stenosis is developing at this level, due to posteriorly projecting bone or OPLL, with effacement anterior subarachnoid space and mild cord flattening. BILATERAL C6 foraminal narrowing worse on the RIGHT. Canal diameter 6-7 mm. C6-7: Posterior projecting inferior residua of C6 vertebral body, or possibly OPLL, significantly narrows the spinal canal and both neural foramina, worse on the LEFT. Severe cord compression. Increasing cord edema/myelomalacia. It is difficult to confirm that the corpectomy cage is appropriately located. C7-T1:  Unremarkable. IMPRESSION: Unremarkable MRI of the brain without and with contrast. Status post C4-C7 ACDF with C6 corpectomy.  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 Canal diameter 4 mm. Increasingly prominent 5 x 3 x 15 mm T2 hyperintense fluid collection dorsal to but within the surgical construct, uncertain significance. CT of the cervical spine without contrast recommended for further evaluation. Electronically Signed   By: Elsie Stain M.D.   On: 04/17/2016 19:17   Mr Cervical Spine W Wo Contrast  Result Date: 04/17/2016 CLINICAL DATA:  Progressive upper and lower extremity weakness over the past few months. Previous ACDF from C4 through C7 which included C6 corpectomy. EXAM: MRI HEAD WITHOUT AND WITH CONTRAST MRI CERVICAL SPINE WITHOUT AND WITH CONTRAST TECHNIQUE: Multiplanar, multiecho pulse sequences of the brain and surrounding structures, and cervical spine, to include the craniocervical junction and cervicothoracic junction, were obtained without and with intravenous contrast. CONTRAST:  11mL MULTIHANCE GADOBENATE DIMEGLUMINE 529 MG/ML IV SOLN COMPARISON:  MRI of the cervical spine 12/29/2015. FINDINGS: MRI HEAD FINDINGS Brain: No evidence for acute infarction, hemorrhage, mass lesion,  hydrocephalus, or extra-axial fluid. Normal for age cerebral volume. Minor white matter disease, not unexpected for age. Post infusion, no abnormal enhancement of the brain or meninges. Vascular: Flow voids are maintained throughout the carotid, basilar, and vertebral arteries. There are no areas of chronic hemorrhage. Skull and upper cervical spine: Normal marrow signal. Sinuses/Orbits: Negative. Other: None. MRI CERVICAL SPINE FINDINGS Alignment: Difficult to assess, given the extensive hardware. The area I most unsure about is at the location of the C6 corpectomy strut, where a large spur projects dorsally. CT of the cervical spine without contrast is needed  for further assessment. Vertebrae: Susceptibility artifact obscures most of the C4 through C7 vertebrae. Above and below this marrow signal is normal. There is a T2 hyperintense fluid collection, 5 x 3 x 15 mm (R-L x A-P x C-C), posterior to the C6 strut which is new/ increased from priors and is associated with posteriorly protruding bone into the ventral epidural space. Motion degraded post contrast imaging shows no worrisome enhancement. Cord: Severe cord compression at C6. Increasing T2 hyperintensity below the area of cord compression. AP diameter of the spinal canal at maximum compression measures 4 mm. Posterior Fossa, vertebral arteries, paraspinal tissues: No tonsillar herniation. Flow voids are maintained both vertebral arteries. No neck masses. Disc levels: C2-3:  Unremarkable. C3-4:  Unremarkable. C4-5:  Unremarkable appearing post fusion interspace. C5-6: Mild to moderate canal stenosis is developing at this level, due to posteriorly projecting bone or OPLL, with effacement anterior subarachnoid space and mild cord flattening. BILATERAL C6 foraminal narrowing worse on the RIGHT. Canal diameter 6-7 mm. C6-7: Posterior projecting inferior residua of C6 vertebral body, or possibly OPLL, significantly narrows the spinal canal and both neural foramina, worse on the LEFT. Severe cord compression. Increasing cord edema/myelomalacia. It is difficult to confirm that the corpectomy cage is appropriately located. C7-T1:  Unremarkable. IMPRESSION: Unremarkable MRI of the brain without and with contrast. Status post C4-C7 ACDF with C6 corpectomy. 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 Canal diameter 4 mm. Increasingly prominent 5 x 3 x 15 mm T2 hyperintense fluid collection dorsal to but within the surgical construct, uncertain  significance. CT of the cervical spine without contrast recommended for further evaluation. Electronically Signed   By: Elsie StainJohn T Curnes M.D.   On: 04/17/2016 19:17   Scheduled Meds: . aspirin EC  81 mg Oral Daily  . darifenacin  7.5 mg Oral Daily  . simvastatin  20 mg Oral q1800   And  . ezetimibe  10 mg Oral q1800  . guaiFENesin  600 mg Oral BID  . heparin subcutaneous  5,000 Units Subcutaneous Q8H  . ipratropium-albuterol  3 mL Nebulization BID  . loratadine  10 mg Oral Daily  . mouth rinse  15 mL Mouth Rinse BID  . mometasone-formoterol  2 puff Inhalation BID  . naproxen sodium  275 mg Oral BID WC  . pantoprazole  40 mg Oral Q0600  . polyethylene glycol  17 g Oral Daily  . sertraline  50 mg Oral Daily   Continuous Infusions: . sodium chloride Stopped (04/17/16 1630)   Principal Problem:   NSTEMI (non-ST elevated myocardial infarction) [facility](HCC) Active Problems:   Essential hypertension   CAD (coronary artery disease)   Spondylosis, cervical, with myelopathy  Time spent:   Standley Dakinslanford Aili Casillas, MD, FAAFP [facility] Pager [phone]  If  7PM-7AM, please contact night-coverage [email or web address] Password [email or web address] 04/19/2016, 7:48 AM    LOS: 3 days

## 2016-04-19 NOTE — Consult Note (Signed)
Provider:  Dr Jaynee Eagles Referring Provider:  Dr. Melina Schools  HPI:  Tara Cruz is a 65 y.o. female here as a referral from Dr. Melina Schools for progressive weakness. Per chart review: PMHx of CAD, RBBB, HTN, COPD, cervical myelopathy. She has a history of progressive LE weakness, gait and imbalance, foot drop and underwent lumbar decompression surgery in 12/2014, Feb 2017 she developed left foot weakness and had a fall in 08/2015 with progressive weakness and gait abnormality as well as right hand weakness and and paresthesias in the hands. 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 cervical discectomy C4-5, C6 corpectomy and fusion C4-7. Patient admitted on the 16th for CP, COPD, NSTEMI and  and continues to c/o worsening weakness likely secondary to cervical myelopathy. Since the surgery she has had second opinions from multiple spine surgeons and a local neurologist to help explain her advancing weakness. Cardiology suspected elevated troponins more related to acute respiratory event with severe coughing and mucous plugs. Treatment for COPD inpatient with improvement.   Per patient, no family at bedside: Patient says she got "choked" at home and was coughing and came to the emergency and was admitted. She feels better with COPD treatment here inpatient but still with SOB. Her breathing is improving. No coughing. She continues to have shortness of breath which started several months ago, improved since being here. She does not use O2 at home. She feel she has worsened weakness, the right hand was never affected and feels it affected more, more weakness in the right arm. No new numbness or tingling. Legs and arm getting weaker. No double vision, no ptosis, no vision changes, no dysphagia, no facial weakness, no changes in speech.  Generalized slowly progressive weakness not increased moreso in the last few days, slowly progressive over months. She reports muscle spasms in the thighs. She reports muscle twitching in the arms and legs. Patient denies acute weakness moreso in the last few days, she reports slowly progressive weakness over months..   Review of Systems: Patient complains of symptoms per HPI. Pertinent negatives per HPI. All others negative.   Social History   Social History  . Marital status: Significant Other    Spouse name: N/A  . Number of children: 2  . Years of education: 13   Occupational History  . Retired [employer]    retired   Social History Main Topics  . Smoking status: Current Every Day Smoker    Packs/day: 0.25    Years: 50.00    Types: Cigarettes  . Smokeless tobacco: Never Used  . Alcohol use Yes     Comment: wine- on weekends   . Drug use: No  . Sexual activity: Yes   Other Topics Concern  . Not on file   Social History Narrative   Lives with fiancee   Caffeine use- coffee on weekend, 1 cup    Family History  Problem Relation Age of Onset  . Coronary artery disease Mother   . Alzheimer's disease Mother   . Rheum arthritis Mother   . Arthritis Mother   . Coronary artery disease Father   . Breast cancer Sister   . Asthma Sister   . Coronary artery disease Sister   . Hypertension Sister   . Arthritis Sister   . Coronary artery disease Maternal Grandfather   . Arthritis Paternal Grandmother   .  Coronary artery disease Paternal Grandfather   . Breast cancer Cousin     Past Medical History:  Diagnosis Date  . Abnormal stress test 01/03/2013   false positive by Dr. Allyson Sabal  . Allergy   . Arthritis    degenerative lumbar spine   . Asthma   . CAD (coronary artery disease) 01/30/2013   single vessel disease of small second diagonal branch, normal EF 65%  . COPD (chronic obstructive pulmonary disease) (HCC)    reviewed by Dr. Sherene Sires  . Depression   .  Emphysema lung (HCC)   . Fall 01/13/2016  . H/O cardiovascular stress test 2014  . Hyperlipidemia   . Hypertension   . Neuropathy (HCC)   . Right bundle branch block   . Tobacco use    has tried to stop numerous times    Past Surgical History:  Procedure Laterality Date  . ABDOMINAL HYSTERECTOMY  1978  . ANTERIOR CERVICAL DECOMP/DISCECTOMY FUSION N/A 11/06/2015   Procedure: ANTERIOR CERVICAL DISCECTOMY  C4-5, C6 CORPECTOMY, FUSION C4-7;  Surgeon: Venita Lick, MD;  Location: [facility] OR;  Service: Orthopedics;  Laterality: N/A;  . BACK SURGERY     12-2014   DR. BROOKS  . CARDIAC CATHETERIZATION  01/30/2013   2nd small diag branch with 95% stentosis, too small for intervention.  No disease in other vessels.  [facility]land Kitchen EYE SURGERY     cataracts removed, /w IOL- bilateral, yag laser  . FOOT SURGERY     for plantar fascitis BILAT  . HAND SURGERY Left    2015  . LEFT HEART CATHETERIZATION WITH CORONARY ANGIOGRAM N/A 01/30/2013   Procedure: LEFT HEART CATHETERIZATION WITH CORONARY ANGIOGRAM;  Surgeon: Runell Gess, MD;  Location: [facility] CATH LAB;  Service: Cardiovascular;  Laterality: N/A;  . TUBAL LIGATION      Current Facility-Administered Medications  Medication Dose Route Frequency Provider Last Rate Last Dose  . 0.9 %  sodium chloride infusion   Intravenous Continuous Arlester Marker, MD   Stopped at 04/17/16 1630  . acetaminophen (TYLENOL) tablet 650 mg  650 mg Oral Q4H PRN Arlester Marker, MD      . alum & mag hydroxide-simeth (MAALOX/MYLANTA) 200-200-20 MG/5ML suspension 30 mL  30 mL Oral Q6H PRN Clanford Cyndie Mull, MD   30 mL at 04/18/16 1247  . aspirin EC tablet 81 mg  81 mg Oral Daily Arlester Marker, MD   81 mg at 04/19/16 1003  . bisacodyl (DULCOLAX) suppository 10 mg  10 mg Rectal Daily PRN Clanford Cyndie Mull, MD      . darifenacin (ENABLEX) 24 hr tablet 7.5 mg  7.5 mg Oral Daily Arlester Marker, MD   7.5 mg at 04/19/16 1003  . simvastatin (ZOCOR) tablet 20 mg  20 mg Oral q1800  Arlester Marker, MD   20 mg at 04/19/16 1748   And  . ezetimibe (ZETIA) tablet 10 mg  10 mg Oral q1800 Arlester Marker, MD   10 mg at 04/19/16 1748  . feeding supplement (ENSURE ENLIVE) (ENSURE ENLIVE) liquid 237 mL  237 mL Oral TID BM Clanford L Johnson, MD   237 mL at 04/19/16 1400  . guaiFENesin (MUCINEX) 12 hr tablet 600 mg  600 mg Oral BID Marcos Eke, PA-C   600 mg at 04/19/16 1003  . heparin injection 5,000 Units  5,000 Units Subcutaneous Q8H Sallee Provencal, RPH   5,000 Units at 04/19/16 1400  . ipratropium-albuterol (DUONEB) 0.5-2.5 (3) MG/3ML nebulizer solution  3 mL  3 mL Nebulization BID Brand Males, MD   3 mL at 04/19/16 2104  . loratadine (CLARITIN) tablet 10 mg  10 mg Oral Daily Susanne Greenhouse, MD   10 mg at 04/19/16 1003  . MEDLINE mouth rinse  15 mL Mouth Rinse BID Susanne Greenhouse, MD   15 mL at 04/19/16 1008  . mometasone-formoterol (DULERA) 200-5 MCG/ACT inhaler 2 puff  2 puff Inhalation BID Susanne Greenhouse, MD   2 puff at 04/19/16 2104  . naproxen sodium (ANAPROX) tablet 275 mg  275 mg Oral BID WC Susanne Greenhouse, MD   275 mg at 04/19/16 0816  . nitroGLYCERIN (NITROSTAT) SL tablet 0.4 mg  0.4 mg Sublingual Q5 Min x 3 PRN Susanne Greenhouse, MD      . ondansetron [facility]) injection 4 mg  4 mg Intravenous Q6H PRN Susanne Greenhouse, MD      . pantoprazole (PROTONIX) EC tablet 40 mg  40 mg Oral Q0600 Clanford Marisa Hua, MD   40 mg at 04/19/16 0557  . polyethylene glycol (MIRALAX / GLYCOLAX) packet 17 g  17 g Oral Daily Susanne Greenhouse, MD   17 g at 04/19/16 1003  . sertraline (ZOLOFT) tablet 50 mg  50 mg Oral Daily Susanne Greenhouse, MD   50 mg at 04/19/16 1003  . zolpidem (AMBIEN) tablet 5 mg  5 mg Oral QHS PRN Braxton Feathers, MD   5 mg at 04/18/16 2232    Allergies as of 04/16/2016  . (No Known Allergies)    Vitals: BP 124/65 (BP Location: Right Arm)   Pulse 80   Temp 98.2 F (36.8 C) (Oral)   Resp 18   Ht '5\' 6"'$  (1.676 m)   Wt 52.7 kg (116 lb 1.6  oz)   SpO2 95%   BMI 18.74 kg/m  Last Weight:  Wt Readings from Last 1 Encounters:  04/16/16 52.7 kg (116 lb 1.6 oz)   Last Height:   Ht Readings from Last 1 Encounters:  04/16/16 '5\' 6"'$  (1.676 m)   Recent Results (from the past 2160 hour(s))  Basic metabolic panel     Status: None   Collection Time: 04/16/16  6:54 AM  Result Value Ref Range   Sodium 142 135 - 145 mmol/L   Potassium 4.1 3.5 - 5.1 mmol/L   Chloride 101 101 - 111 mmol/L   CO2 32 22 - 32 mmol/L   Glucose, Bld 95 65 - 99 mg/dL   BUN 17 6 - 20 mg/dL   Creatinine, Ser 0.51 0.44 - 1.00 mg/dL   Calcium 9.7 8.9 - 10.3 mg/dL   GFR calc non Af Amer >60 >60 mL/min   GFR calc Af Amer >60 >60 mL/min    Comment: (NOTE) The eGFR has been calculated using the CKD EPI equation. This calculation has not been validated in all clinical situations. eGFR's persistently <60 mL/min signify possible Chronic Kidney Disease.    Anion gap 9 5 - 15  CBC WITH DIFFERENTIAL     Status: None   Collection Time: 04/16/16  6:54 AM  Result Value Ref Range   WBC 7.8 4.0 - 10.5 K/uL   RBC 4.72 3.87 - 5.11 MIL/uL   Hemoglobin 14.8 12.0 - 15.0 g/dL   HCT 46.0 36.0 - 46.0 %   MCV 97.5 78.0 - 100.0 fL   MCH 31.4 26.0 - 34.0 pg   MCHC 32.2 30.0 - 36.0 g/dL   RDW 13.7 11.5 -  15.5 %   Platelets 274 150 - 400 K/uL   Neutrophils Relative % 68 %   Neutro Abs 5.3 1.7 - 7.7 K/uL   Lymphocytes Relative 20 %   Lymphs Abs 1.5 0.7 - 4.0 K/uL   Monocytes Relative 12 %   Monocytes Absolute 0.9 0.1 - 1.0 K/uL   Eosinophils Relative 0 %   Eosinophils Absolute 0.0 0.0 - 0.7 K/uL   Basophils Relative 0 %   Basophils Absolute 0.0 0.0 - 0.1 K/uL  Heparin level (unfractionated)     Status: None   Collection Time: 04/16/16 11:19 AM  Result Value Ref Range   Heparin Unfractionated 0.46 0.30 - 0.70 IU/mL    Comment:        IF HEPARIN RESULTS ARE BELOW EXPECTED VALUES, AND PATIENT DOSAGE HAS BEEN CONFIRMED, SUGGEST FOLLOW UP TESTING OF ANTITHROMBIN III  LEVELS.   Troponin I     Status: Abnormal   Collection Time: 04/16/16 11:19 AM  Result Value Ref Range   Troponin I 0.60 (HH) <0.03 ng/mL    Comment: CRITICAL RESULT CALLED TO, READ BACK BY AND VERIFIED WITH: MURPHY,D. RN @ 1329 04/16/16 BY EDENS,C.   Heparin level (unfractionated)     Status: None   Collection Time: 04/16/16  2:28 PM  Result Value Ref Range   Heparin Unfractionated 0.31 0.30 - 0.70 IU/mL    Comment:        IF HEPARIN RESULTS ARE BELOW EXPECTED VALUES, AND PATIENT DOSAGE HAS BEEN CONFIRMED, SUGGEST FOLLOW UP TESTING OF ANTITHROMBIN III LEVELS.   Troponin I     Status: Abnormal   Collection Time: 04/16/16  5:15 PM  Result Value Ref Range   Troponin I 0.39 (HH) <0.03 ng/mL    Comment: CRITICAL VALUE NOTED.  VALUE IS CONSISTENT WITH PREVIOUSLY REPORTED AND CALLED VALUE.  Troponin I     Status: Abnormal   Collection Time: 04/16/16 10:18 PM  Result Value Ref Range   Troponin I 0.26 (HH) <0.03 ng/mL    Comment: CRITICAL VALUE NOTED.  VALUE IS CONSISTENT WITH PREVIOUSLY REPORTED AND CALLED VALUE.  Heparin level (unfractionated)     Status: None   Collection Time: 04/17/16  3:42 AM  Result Value Ref Range   Heparin Unfractionated 0.31 0.30 - 0.70 IU/mL    Comment:        IF HEPARIN RESULTS ARE BELOW EXPECTED VALUES, AND PATIENT DOSAGE HAS BEEN CONFIRMED, SUGGEST FOLLOW UP TESTING OF ANTITHROMBIN III LEVELS.   CBC     Status: None   Collection Time: 04/17/16  3:42 AM  Result Value Ref Range   WBC 7.5 4.0 - 10.5 K/uL   RBC 4.58 3.87 - 5.11 MIL/uL   Hemoglobin 14.3 12.0 - 15.0 g/dL   HCT 45.7 36.0 - 46.0 %   MCV 99.8 78.0 - 100.0 fL   MCH 31.2 26.0 - 34.0 pg   MCHC 31.3 30.0 - 36.0 g/dL   RDW 13.6 11.5 - 15.5 %   Platelets 259 150 - 400 K/uL  Echocardiogram     Status: None   Collection Time: 04/17/16  3:43 PM  Result Value Ref Range   Weight 1,857.6 oz   Height 66 in   BP 124/73 mmHg  CBC     Status: None   Collection Time: 04/18/16  4:45 AM  Result  Value Ref Range   WBC 7.6 4.0 - 10.5 K/uL   RBC 4.46 3.87 - 5.11 MIL/uL   Hemoglobin 13.9 12.0 - 15.0 g/dL  HCT 44.1 36.0 - 46.0 %   MCV 98.9 78.0 - 100.0 fL   MCH 31.2 26.0 - 34.0 pg   MCHC 31.5 30.0 - 36.0 g/dL   RDW 13.1 11.5 - 15.5 %   Platelets 266 150 - 400 K/uL  CBC     Status: None   Collection Time: 04/19/16  3:10 AM  Result Value Ref Range   WBC 9.3 4.0 - 10.5 K/uL   RBC 4.44 3.87 - 5.11 MIL/uL   Hemoglobin 13.6 12.0 - 15.0 g/dL   HCT 43.6 36.0 - 46.0 %   MCV 98.2 78.0 - 100.0 fL   MCH 30.6 26.0 - 34.0 pg   MCHC 31.2 30.0 - 36.0 g/dL   RDW 13.0 11.5 - 15.5 %   Platelets 233 150 - 400 K/uL   MRI CERVICAL SPINE FINDINGS (personally reviewed images and discussed with neuroradiology dr Redmond Pulling)  Alignment: Difficult to assess, given the extensive hardware. The area I most unsure about is at the location of the C6 corpectomy strut, where a large spur projects dorsally. CT of the cervical spine without contrast is needed for further assessment.  Vertebrae: Susceptibility artifact obscures most of the C4 through C7 vertebrae. Above and below this marrow signal is normal. There is a T2 hyperintense fluid collection, 5 x 3 x 15 mm (R-L x A-P x C-C), posterior to the C6 strut which is new/ increased from priors and is associated with posteriorly protruding bone into the ventral epidural space. Motion degraded post contrast imaging shows no worrisome enhancement.  Cord: Severe cord compression at C6. Increasing T2 hyperintensity below the area of cord compression. AP diameter of the spinal canal at maximum compression measures 4 mm.  Posterior Fossa, vertebral arteries, paraspinal tissues: No tonsillar herniation. Flow voids are maintained both vertebral arteries. No neck masses.  Disc levels:  C2-3:  Unremarkable.  C3-4:  Unremarkable.  C4-5:  Unremarkable appearing post fusion interspace.  C5-6: Mild to moderate canal stenosis is developing at this  level, due to posteriorly projecting bone or OPLL, with effacement anterior subarachnoid space and mild cord flattening. BILATERAL C6 foraminal narrowing worse on the RIGHT. Canal diameter 6-7 mm.  C6-7: Posterior projecting inferior residua of C6 vertebral body, or possibly OPLL, significantly narrows the spinal canal and both neural foramina, worse on the LEFT. Severe cord compression. Increasing cord edema/myelomalacia. It is difficult to confirm that the corpectomy cage is appropriately located.  C7-T1:  Unremarkable.  IMPRESSION: Unremarkable MRI of the brain without and with contrast.  Status post C4-C7 ACDF with C6 corpectomy. 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 Canal diameter 4 mm.  Increasingly prominent 5 x 3 x 15 mm T2 hyperintense fluid collection dorsal to but within the surgical construct, uncertain significance. CT of the cervical spine without contrast recommended for further evaluation.  CT cervical spine:  IMPRESSION: No change in the appearance the cervical spine since the comparison MRI.  Status post C4-7 fusion C6 corpectomy. Severe central canal stenosis at C6-7 and milder degree of central canal narrowing C5-6 is identified as described above. Bilateral foraminal narrowing at these levels is also unchanged in appearance  Physical exam: Exam: Gen: NAD, conversant                   CV: RRR, no MRG. No Carotid Bruits. No peripheral edema,  warm, nontender Eyes: Conjunctivae clear without exudates or hemorrhage  Neuro: Detailed Neurologic Exam  Speech:    Speech is normal; fluent and spontaneous with normal comprehension.  Cognition:    The patient is oriented to person, place, and time; good historian, follows commands, remote and recent memory intact    Cranial Nerves:    The pupils are equal, round, and reactive to light. Attempted fundoscopic exam could not visualize.  Visual fields are full to finger confrontation. Extraocular movements are intact. Trigeminal sensation is intact and the muscles of mastication are normal. The face is symmetric. The palate elevates in the midline. Hearing intact. Voice is normal. Shoulder shrug is normal. The tongue has normal motion without fasciculations.   Motor Observation:    Distal wasting in the arms most pronounced in the FDI, generalized atrophy.    Fasciculations in the left deltoid, not seen elsewhere Tone:    Not increased    Strength: Poor effort. 2/5 left arm, 3/5 right arm. Bilateral legs 2/5 with 1/5 bilat DF.          Sensation:  Intact pin prick distally in the feet, proprioception at great toes intact.      Reflex Exam:  DTR's:  Biceps are brisk bilaterally, Patellars +3 with crossed adductors, absent AJs   Toes:    The toes are equivocal bilaterally.   Clonus:    Clonus is absent.  Gait: deferred, patient cannot ambulate independently, patient cannot sit in bed unassisted.      Assessment/Plan:  65 y/o with severe stenosis and cord compression. This is similar to prior MRI from July but with increased myelomalacia.  Neurology was consulted to assess for motor neuron disease, CIDP or any other possible neurologic cause of her progressive worsening.   Patient had treatment with cervical decompression surgery, but unfortunately patient continues to worsen, likely in the setting of ongoing cord compression at C6 level from residual bone spurring. Other contributing factor to weakness may be severe malnutrition, per note today "Malnutrition related to poor appetite, chronic illness as evidenced by severe depletion of body fat, severe depletion of muscle mass, percent weight loss." and she has lost 37 pounds over the last few months.  Less likely due to any other cause such as motor  neuron disease, neuromuscular disorder or demyelinating polyneuropathy however patient may benefit from repeat emg/ncs outpatient to ensure no other reversible causes of progressive weakness and muscle loss especially if further surgery is contraindicated and she cannot have further decompressive treatment. Discussed with Dr. Melina Schools and I agree she would benefit from transfer to an academic facility if possible. EMG/NCS can only be performed outpatient unless she is sent to an academic institution who has inpatient abilities for this test. At this time inpatient neurology will sign off. I will discuss with Dr. Leta Baptist who follows outpatient.     Sarina Ill, MD  [facility] [address] [place], [place] [address]  Phone [phone] Fax [phone]  A total of 115 minutes was spent face-to-face with this patient and in the care of this patient. Over half this time was spent on counseling patient on the diagnosis and different therapeutic options available.

## 2016-04-19 NOTE — Progress Notes (Signed)
Initial Nutrition Assessment  DOCUMENTATION CODES:   Severe malnutrition in context of chronic illness  INTERVENTION:  Ensure Enlive po BID, each supplement provides 350 kcal and 20 grams of protein  Snacks- Strawberry/peach yogurt for morning snack.   Will change to needs assist ordering meals.    NUTRITION DIAGNOSIS:   Malnutrition related to poor appetite, chronic illness as evidenced by severe depletion of body fat, severe depletion of muscle mass, percent weight loss.   GOAL:   Patient will meet greater than or equal to 90% of their needs   MONITOR:   PO intake, Supplement acceptance  REASON FOR ASSESSMENT:   Malnutrition Screening Tool    ASSESSMENT:   65 y.o. female  Tara Cruz is an 65 y.o. female with multiple medical issues including CAD with 95 % Dx2 treated medically, RBBB, HTN, COPD, and progressive myelopathy with profound muscle weakness, admitted on 11/16 for the management of chest pain and acute/chronic respiratory failure.    Met with patient in room today. Patient has severe myelopathy and is unable to move her arms or legs; needs help with all feedings and help with ordering meals. Patient has lost 37lbs(24%) over the past few months. Patient reports poor appetite now and pta. Patient reports that she will eat a few bites of each of her meals. Patient normally drinks ensures at home and loves yogurt. Patient reports constipation today. Patient reports having N/V yesterday but states that is resolved today.    Medications reviewed and include: aspirin, heparin, protonix, miralax  Labs reviewed: none significant   Nutrition-Focused physical exam completed. Findings are severe fat depletion, severe muscle depletion, and mild pitting edema in bilateral LE.   Diet Order:  Diet regular Room service appropriate? Yes; Fluid consistency: Thin  Skin:  Reviewed, no issues  Last BM:  11/19  Height:   Ht Readings from Last 1 Encounters:  04/16/16  '5\' 6"'$  (1.676 m)    Weight:   Wt Readings from Last 1 Encounters:  04/16/16 116 lb 1.6 oz (52.7 kg)    Ideal Body Weight:  59 kg  BMI:  Body mass index is 18.74 kg/m.  Estimated Nutritional Needs:   Kcal:  1600-1800kcal/day   Protein:  74-84g/day  Fluid:  >1.5L/day  EDUCATION NEEDS:   No education needs identified at this time  Koleen Distance, RD, LDN

## 2016-04-20 DIAGNOSIS — J441 Chronic obstructive pulmonary disease with (acute) exacerbation: Principal | ICD-10-CM

## 2016-04-20 DIAGNOSIS — E43 Unspecified severe protein-calorie malnutrition: Secondary | ICD-10-CM

## 2016-04-20 DIAGNOSIS — R29898 Other symptoms and signs involving the musculoskeletal system: Secondary | ICD-10-CM

## 2016-04-20 MED ORDER — SALINE SPRAY 0.65 % NA SOLN
1.0000 | NASAL | Status: DC | PRN
  Administered 2016-04-20: 1 via NASAL
  Filled 2016-04-20: qty 44

## 2016-04-20 NOTE — Progress Notes (Addendum)
PROGRESS NOTE    Tara Cruz  ZOX:[id number]  DOB: 1950-08-23  DOA: 04/16/2016 PCP: Rogelia Boga, MD Outpatient Specialists:  Hospital course: Tara Cruz is an 65 y.o. female  Tara Cruz is an 65 y.o. female with multiple medical issues including CAD with 95 % Dx2 treated medically, RBBB, HTN, COPD, and progressive myelopathy with profound muscle weakness, admitted on 11/16 for the management of chest pain. She has been increasing her mucous production without hemoptysis. Denies fevers, chills, night sweats or mucositis. Reports fevers up to , chills and night sweats. Denies any chest pain, chest wall pain or palpitations.Denies any sick contacts or recent long distant travels. Denies any abdominal pain. Has decreased appetite due to current symptoms.  Reports nausea without vomiting. No dysphagia. Denies dizziness or vertigo. Denies lower extremity swelling at this time . No confusion.  Denies any vision changes, double vision or headaches.   Assessment & Plan:   Acute on chronic respiratory failure - CT angio chest negative for PE, no aspiration pneumonitis or airway foreign body, small L mucous plug in the LLL. Upper centrolobullar emphysema . WBC normal.   chest x-ray with bilateral pleural effusions among cardiomegaly.  Patient has a history of cervical spondylosis with myelopathy, with likely progression of the disease.  Treating COPD with neb and clinically improved, still requiring supplemental oxygen and still with shallow breathing.  Try to wean oxygen.  Essential Hypertension - blood pressures have been well controlled.  CAD - appreciate cardiology assistance.  Echo: - Normal LV systolic function; grade 1 diastolic dysfunction.  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  Ortho recommending neurology consult.  I spoke with neurologist and they reviewed films but they felt like they had nothing to offer.   ordered CT C spine 11/19 - see below.  Dr. Shon Baton saw patient 11/20 and recalled neurology, They came and saw patient and had nothing to offer - see notes. Dr. Shon Baton reportedly is looking into having patient transferred to a tertiary institution.    Severe protein calorie malnutrition with abnormal weight loss - see dietitian recommendations.   DVT prophylaxis: heparin Code Status: FULL Family Communication: patient Disposition Plan: TBD  Consultants:  Cardiology  Orthopedics  neurohospitalists  Subjective: Pt reports that her breathing still is the same, discussing with Dr. Shon Baton about going to [facility]  Objective: Vitals:   04/19/16 0823 04/19/16 1937 04/19/16 2104 04/20/16 0620  BP:  124/65  121/61  Pulse:  80  80  Resp:  18  17  Temp:  98.2 F (36.8 C)  98.1 F (36.7 C)  TempSrc:  Oral  Oral  SpO2: (!) 89% 95% 95% 93%  Weight:      Height:        Intake/Output Summary (Last 24 hours) at 04/20/16 0844 Last data filed at 04/20/16 8119  Gross per 24 hour  Intake              360 ml  Output             1250 ml  Net             -890 ml   Filed Weights   04/16/16 0439  Weight: 52.7 kg (116 lb 1.6 oz)    Exam:  Constitutional: Appears calm,  alert and awake, oriented x3, not in any acute distress.  Very thin, chronically ill appearing. Eyes: PERLA,  EOMI, irises appear normal, anicteric sclera,  ENMT: external ears and nose appear normal, normal hearing or hard of hearin. Lips appears normal, oropharynx mucosa, tongue, posterior pharynx appear normal  Neck: neck appears normal, no masses, normal ROM, no thyromegaly, no JVD  CVS: S1-S2 clear, no murmur rubs or gallops, no LE edema, normal pedal pulses  Respiratory: shallow breathing but lungs clear to auscultation bilateral.  Abdomen: soft nontender, nondistended, normal bowel sounds, no hepatosplenomegaly, no hernias    Musculoskeletal: no cyanosis, clubbing or edema noted bilaterally.  Some muscle wasting noted at the upper extremities  Neuro:remarkable for profound weakness in her upper extremities and lower extremities  Psych: judgement and insight appear normal, stable mood and affect, mental status Skin: no rashes or lesions or ulcers, no induration or nodules   Data Reviewed: Basic Metabolic Panel:  Recent Labs Lab 04/16/16 0654  NA 142  K 4.1  CL 101  CO2 32  GLUCOSE 95  BUN 17  CREATININE 0.51  CALCIUM 9.7   Liver Function Tests: No results for input(s): AST, ALT, ALKPHOS, BILITOT, PROT, ALBUMIN in the last 168 hours. No results for input(s): LIPASE, AMYLASE in the last 168 hours. No results for input(s): AMMONIA in the last 168 hours. CBC:  Recent Labs Lab 04/16/16 0654 04/17/16 0342 04/18/16 0445 04/19/16 0310  WBC 7.8 7.5 7.6 9.3  NEUTROABS 5.3  --   --   --   HGB 14.8 14.3 13.9 13.6  HCT 46.0 45.7 44.1 43.6  MCV 97.5 99.8 98.9 98.2  PLT 274 259 266 233   Cardiac Enzymes:  Recent Labs Lab 04/16/16 1119 04/16/16 1715 04/16/16 2218  TROPONINI 0.60* 0.39* 0.26*   CBG (last 3)  No results for input(s): GLUCAP in the last 72 hours. No results found for this or any previous visit (from the past 240 hour(s)).   Studies: Ct Cervical Spine Wo Contrast  Result Date: 04/18/2016 CLINICAL DATA:  Neck pain and bilateral arm weakness. History of cervical fusion in June, 2017. EXAM: CT CERVICAL SPINE WITHOUT CONTRAST TECHNIQUE: Multidetector CT imaging of the cervical spine was performed without intravenous contrast. Multiplanar CT image reconstructions were also generated. COMPARISON:  MRI cervical spine 04/17/2016. FINDINGS: Alignment: Straightening of lordosis is noted. Trace anterolisthesis C3 on C4 due to facet arthropathy is seen. Alignment is unchanged. Skull base and vertebrae: The patient is status post C3-7 fusion and C6 corpectomy. Hardware is intact without loosening.  The C4-5 interbody spacer is incorporated into the endplates. There also appears to be incorporation of strut graft at C6. Small fluid collection posterior to the C6 strut seen on MRI is not visible on this exam. Soft tissues and spinal canal: Unremarkable. Disc levels: The appearance of intervertebral disc spaces is unchanged since the comparison MRI. 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 Residual endplate spurring at C5-6 causes moderate central canal stenosis. Severe right and moderately severe left foraminal narrowing C5-6 is identified. Moderate to moderately severe bilateral foraminal narrowing C6-7 is worse on the left. Upper chest: There is some apical scarring.  Otherwise unremarkable. Other: None. IMPRESSION: No change in the appearance the cervical spine since the comparison MRI. Status post C4-7 fusion C6 corpectomy. Severe central canal stenosis at C6-7 and milder degree of central canal narrowing C5-6 is identified as described above. Bilateral foraminal narrowing at these levels is also unchanged in appearance. Electronically Signed  By: Drusilla Kannerhomas  Dalessio M.D.   On: 04/18/2016 15:31   Scheduled Meds: . aspirin EC  81 mg Oral Daily  . darifenacin  7.5 mg Oral Daily  . simvastatin  20 mg Oral q1800   And  . ezetimibe  10 mg Oral q1800  . feeding supplement (ENSURE ENLIVE)  237 mL Oral TID BM  . guaiFENesin  600 mg Oral BID  . heparin subcutaneous  5,000 Units Subcutaneous Q8H  . ipratropium-albuterol  3 mL Nebulization BID  . loratadine  10 mg Oral Daily  . mouth rinse  15 mL Mouth Rinse BID  . mometasone-formoterol  2 puff Inhalation BID  . naproxen sodium  275 mg Oral BID WC  . pantoprazole  40 mg Oral Q0600  . polyethylene glycol  17 g Oral Daily  . sertraline  50 mg Oral Daily   Continuous Infusions: . sodium chloride Stopped (04/17/16 1630)   Principal  Problem:   NSTEMI (non-ST elevated myocardial infarction) (HCC) Active Problems:   Essential hypertension   COPD exacerbation (HCC)   CAD (coronary artery disease)   Spondylosis, cervical, with progressive myelopathy   Leg weakness   Protein-calorie malnutrition, severe  Time spent:   Standley Dakinslanford Perris Tripathi, MD, FAAFP [facility] Pager [phone]  If 7PM-7AM, please contact night-coverage [email or web address] Password [email or web address] 04/20/2016, 8:44 AM    LOS: 4 days

## 2016-04-20 NOTE — Progress Notes (Signed)
Patients clinical exam unchanged Case [facility] and [facility] Patient will be discharged tomorrow and follow up with Dr Christene LyeKarikari in 2 weeks Patient [facility] with her to the appointment Contact no. [phone]

## 2016-04-20 NOTE — Progress Notes (Signed)
SATURATION QUALIFICATIONS: (This note is used to comply with regulatory documentation for home oxygen)  Patient Saturations on Room Air at Rest = 88%  Patient Saturations on Room Air while Ambulating = n/a%  Patient Saturations on n/a Liters of oxygen while Ambulating = n/a%  Please briefly explain why patient needs home oxygen:  Pt is bedbound.  SpO2 88% on RA at rest, sats back up to 92% on 2L via [place] at rest.

## 2016-04-20 NOTE — Care Management Important Message (Signed)
Important Message  Patient Details  Name: Tara Bottcheratricia A Basulto MRN: [id number] Date of Birth: [birth year]   Medicare Important Message Given:  Yes    Zyanne Schumm Abena 04/20/2016, 10:08 AM

## 2016-04-20 NOTE — Consult Note (Signed)
Tara Cruz:  Bloodsworth, Zahrah            ACCOUNT NO.:  [id number]  MEDICAL RECORD NO.:  [id number]  LOCATION:  [id number]                        FACILITY:  [facility]  PHYSICIAN:  Simmone Cape D. Shon BatonBrooks, M.D. DATE OF BIRTH:  1951-04-09  DATE OF CONSULTATION:  04/19/2016 DATE OF DISCHARGE:                                CONSULTATION   HISTORY:  She is a very pleasant woman who is well known to me.  She presented to my office with progressive debilitating cervical spondylitic myelopathy.  She subsequently underwent surgery, but unfortunately her myelopathic symptoms have progressed.  She is admitted now with progressive weakness, and shortness of breath.  CLINICAL HISTORY:  In June 2017, patient underwent ACDF C4-5 with a C6 corpectomy and anterior fusion from C4-C7.  There were no surgical complications, but unfortunately her symptoms progressed. Initially, she was ambulating upon discharge from her surgery and then over the course of the subsequent months, she has rapidly declined.  She is now essentially wheelchair bound with global weaknessand she has been having progressive shortness of breath. I have had the patient evaluated by 2 additional spinal surgeons as well as her neurologist. It has not been until recent that she has been willing to consider additional surgery.   The patient presented to the hospital with acute on chronic respiratory failure, as well as coronary artery disease with 95% blockage, right bundle-branch block, hypertension, and COPD.  She was admitted secondary to her respiratory failure.  Her troponin and other cardiac enzymes did elevate and she was also evaluated by Cardiology.  A 2D echo was also performed.  Past medical, surgical, family and social history are outlined in the hospital records please refer to him for specifics.    Her clinical  exam is essentially unchanged from when I last saw her in my office. She has very limited motion of the arms as well.  She is  essentially 1+/5 upper extremity strength throughout bilaterally.  She has a positive Hoffmann sign bilaterally.  No clonus but positive Babinski test.  She has intact sensation to light touch diffusely in the upper and lower extremities.  Lower extremity is essentially 0/5 strength. She is unable to stand.  She is unable to really move the legs.  She has significantly labored breathing but no chest pain at present.  Pupils are equal, round, reactive to light and accommodation.  No gross facial asymmetry or weakness noted.  At this point in time, I am very concerned about the progressive nature of her deterioration.  After reviewing the notes of other spinal surgeons who evaluated her, I agree with the fact that whether or not this rapid progression is secondary to progressive myelopathy or some other additional neurological condition.  The rapid progression is not typical following myelopathy surgery.  Furthermore, the fixation is solid and there was no significant motion across that segment.  I have reviewed the notes of the neurologist felt as though there was nothing that could be done, it is primarily a surgical problem.  I called the neurologist myself and asked for an evaluation to rule out other underlying neurologic condition such as ALS.  I have also discussed with the patient perhaps [facility] a  [facility].  Once she is discharged and stabilized from this admission, she has asked me to look into that and I will go ahead and set that up for her.  While she is here, I am going to request the Neurology consult to ensure there is no other underlying neurological disorder that could be also confounding her recovery and further progressing her neurological deterioration.     Kahne Helfand D. Shon BatonBrooks, M.D.     DDB/MEDQ  D:  04/19/2016  T:  04/20/2016  Job:  [id number]  cc:   Dr. Shon BatonBrooks' office

## 2016-04-20 NOTE — Progress Notes (Signed)
Per MD care instruction, Radiology to send pt copy of CD of her images.  Tara Cruz from radiology will be tubing to 2W when it's ready.

## 2016-04-21 MED ORDER — IPRATROPIUM-ALBUTEROL 0.5-2.5 (3) MG/3ML IN SOLN: 3.0000 mL | Freq: Two times a day (BID) | RESPIRATORY_TRACT | 0 refills | Status: DC

## 2016-04-21 MED ORDER — CYCLOBENZAPRINE HCL 10 MG PO TABS
10.0000 mg | ORAL_TABLET | Freq: Once | ORAL | Status: AC
  Administered 2016-04-21: 10 mg via ORAL
  Filled 2016-04-21: qty 1

## 2016-04-21 MED ORDER — GUAIFENESIN ER 600 MG PO TB12: 600.0000 mg | ORAL_TABLET | Freq: Two times a day (BID) | ORAL | 0 refills | Status: DC

## 2016-04-21 MED ORDER — HYDROCODONE-ACETAMINOPHEN 5-325 MG PO TABS
1.0000 | ORAL_TABLET | ORAL | Status: DC | PRN
  Administered 2016-04-21: 2 via ORAL
  Administered 2016-04-21: 1 via ORAL
  Filled 2016-04-21: qty 1
  Filled 2016-04-21: qty 2

## 2016-04-21 MED ORDER — PANTOPRAZOLE SODIUM 40 MG PO TBEC: 40.0000 mg | DELAYED_RELEASE_TABLET | Freq: Every day | ORAL | 0 refills | Status: DC

## 2016-04-21 MED ORDER — ENSURE ENLIVE PO LIQD: 237.0000 mL | Freq: Three times a day (TID) | ORAL | 12 refills | Status: DC

## 2016-04-21 NOTE — Discharge Summary (Signed)
Physician Discharge Summary  Tara Cruz:[id number] DOB: 28-Oct-1950 DOA: 04/16/2016  PCP: Rogelia Boga, MD  Admit date: 04/16/2016 Discharge date: 04/21/2016  Admitted From: Home  Disposition:  Home   Recommendations for Outpatient Follow-up:  1. Follow up with PCP in 1-2 weeks 2. Please obtain BMP/CBC in one week 3. Please follow up on the following pending results:  Home Health: yes.  Equipment/Devices; home oxygen.   Discharge Condition:stable.  CODE STATUS: Full code.  Diet recommendation: Heart Healthy  Brief/Interim Summary: Tara Quinonez Elliottis an 65 y.o.femalePatricia A Cruz is an 65 y.o. female with multiple medical issues including CAD with 95 % Dx2 treated medically, RBBB, HTN, COPD, and progressive myelopathy with profound muscle weakness, admitted on 11/16 for the management of chest pain. She has been increasing her mucous production without hemoptysis. Denies fevers, chills, night sweats or mucositis. Reports fevers up to , chills and night sweats. Denies any chest pain, chest wall pain or palpitations.Denies any sick contacts or recent long distant travels. Denies any abdominal pain. Has decreased appetite due to current symptoms.  Reports nausea without vomiting. No dysphagia.Denies dizziness or vertigo. Denies lower extremity swelling at this time . No confusion.  Denies any vision changes, double vision or headaches.   Assessment & Plan:    Acute on chronic respiratory failure - CT angio chest negative for PE, no aspiration pneumonitis or airway foreign body, small L mucous plug in the LLL. Upper centrolobullar emphysema . WBC normal. chest x-ray with bilateral pleural effusions among cardiomegaly. Patient has a history of cervical spondylosis with myelopathy, with likely progression of the disease.  Treating COPD with neb and clinically improved, still requiring supplemental oxygen. she will need home oxygen.  Stable.    Essential  Hypertension - blood pressures have been well controlled.  CAD - appreciate cardiology assistance.  Echo: - Normal LV systolic function; grade 1 diastolic dysfunction.  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  Ortho recommending neurology consult. Dr Laural Benes spoke with neurologist and they reviewed films but they felt like they had nothing to offer.   ordered CT C spine 11/19 - see below.  Dr. Shon Baton saw patient 11/20 and recalled neurology, They came and saw patient and had nothing to offer - see notes. Dr. Shon Baton reportedly is looking into having patient transferred to a tertiary institution.  Dr Shon Baton discussed case with Dr Christene Lye at [facility], plan is for patient to follow at [facility] in 2 weeks.   Severe protein calorie malnutrition with abnormal weight loss - see dietitian recommendations.    Discharge Diagnoses:  Principal Problem:   Spondylosis, cervical, with progressive myelopathy Active Problems:   Essential hypertension   COPD exacerbation (HCC)   CAD (coronary artery disease)   Leg weakness   NSTEMI (non-ST elevated myocardial infarction) (HCC)   Protein-calorie malnutrition, severe    Discharge Instructions  Discharge Instructions    Diet - low sodium heart healthy    Complete by:  As directed    Increase activity slowly    Complete by:  As directed        Medication List    TAKE these medications   albuterol 108 (90 Base) MCG/ACT inhaler Commonly known as:  PROVENTIL HFA;VENTOLIN HFA Inhale 2 puffs into the lungs every 4 (four) hours as needed for wheezing or shortness of breath.   celecoxib 200 MG capsule Commonly known as:  CELEBREX Take 200 mg by mouth  daily.   cetirizine 10 MG tablet Commonly known as:  ZYRTEC Take 10 mg by mouth daily.   ECOTRIN PO Take 81 mg by mouth daily.   estradiol 0.5 MG tablet Commonly known as:  ESTRACE Take 0.5 mg by mouth  daily.   ezetimibe-simvastatin 10-20 MG tablet Commonly known as:  VYTORIN Take 1 tablet by mouth daily.   feeding supplement (ENSURE ENLIVE) Liqd Take 237 mLs by mouth 3 (three) times daily between meals.   Fluticasone-Salmeterol 250-50 MCG/DOSE Aepb Commonly known as:  ADVAIR Inhale 1 puff into the lungs 2 (two) times daily as needed (For shortness of breath.).   guaiFENesin 600 MG 12 hr tablet Commonly known as:  MUCINEX Take 1 tablet (600 mg total) by mouth 2 (two) times daily.   ipratropium-albuterol 0.5-2.5 (3) MG/3ML Soln Commonly known as:  DUONEB Take 3 mLs by nebulization 2 (two) times daily.   naproxen sodium 220 MG tablet Commonly known as:  ANAPROX Take 220 mg by mouth 2 (two) times daily as needed (for pain or headache).   pantoprazole 40 MG tablet Commonly known as:  PROTONIX Take 1 tablet (40 mg total) by mouth daily at 6 (six) AM. Start taking on:  04/22/2016   polyethylene glycol packet Commonly known as:  MIRALAX / GLYCOLAX Take 17 g by mouth daily as needed for moderate constipation.   sertraline 50 MG tablet Commonly known as:  ZOLOFT Take 50 mg by mouth daily.   solifenacin 5 MG tablet Commonly known as:  VESICARE Take 5 mg by mouth daily.            Durable Medical Equipment        Start     Ordered   04/21/16 1141  For home use only DME oxygen  Once    Question Answer Comment  Mode or (Route) Nasal cannula   Liters per Minute 2   Frequency Continuous (stationary and portable oxygen unit needed)   Oxygen delivery system Gas      04/21/16 1140     Follow-up Information    KARIKARI, ISAAC, MD Follow up in 2 week(s).   Specialty:  Neurosurgery Contact information: [address] [place] [address] [phone]          No Known Allergies  Consultations:  Orthopedic.   Neurology   Cardiology    Procedures/Studies: Ct Angio Chest Pe W Or Wo Contrast  Result Date: 04/16/2016 CLINICAL DATA:  Pt got  choked yesterday while eating; Heimlich maneuver done, very difficult to get food dislodged; pt had onset of midline chest pain, increased SOB after episode. Pt's chest pain has subsided, and SOB has improved; 70ml Isovue.*comment was truncated* EXAM: CT ANGIOGRAPHY CHEST WITH CONTRAST TECHNIQUE: Multidetector CT imaging of the chest was performed using the standard protocol during bolus administration of intravenous contrast. Multiplanar CT image reconstructions and MIPs were obtained to evaluate the vascular anatomy. CONTRAST:  70 mL Isovue COMPARISON:  Chest CT 5 8 17  FINDINGS: Cardiovascular: No filling defects within pulmonary suggest acute pulmonary embolism. No acute findings of the aorta great vessels. Minimal intimal calcification of the aortic arch. Coronary artery calcification. Mediastinum/Nodes: No axillary or supraclavicular adenopathy. Mediastinal hilar adenopathy. Esophagus normal. Lungs/Pleura: Centrilobular emphysema in the upper lobes. No aspiration pneumonitis. Minimal basilar atelectasis from airways normal. A single focus of mucous plugging is noted in the LEFT lower lobe (image 54, series 6). Upper Abdomen: Limited view of the liver, kidneys, pancreas are unremarkable. Normal adrenal  glands. No aggressive osseous lesion. Anterior cervical fusion noted Musculoskeletal: Anterior cervical fusion. No acute osseous abnormality. Review of the MIP images confirms the above findings. IMPRESSION: 1. No evidence acute pulmonary embolism. 2. No aspiration pneumonitis or airway foreign body. 3. Small Focus of mucous plugging in the LEFT lower lobe. 4. Coronary artery calcification and aortic atherosclerotic calcification. 5. Upper lobe central lobular emphysema. Electronically Signed   By: Genevive Bi M.D.   On: 04/16/2016 10:17   Ct Cervical Spine Wo Contrast  Result Date: 04/18/2016 CLINICAL DATA:  Neck pain and bilateral arm weakness. History of cervical fusion in June, 2017. EXAM: CT  CERVICAL SPINE WITHOUT CONTRAST TECHNIQUE: Multidetector CT imaging of the cervical spine was performed without intravenous contrast. Multiplanar CT image reconstructions were also generated. COMPARISON:  MRI cervical spine 04/17/2016. FINDINGS: Alignment: Straightening of lordosis is noted. Trace anterolisthesis C3 on C4 due to facet arthropathy is seen. Alignment is unchanged. Skull base and vertebrae: The patient is status post C3-7 fusion and C6 corpectomy. Hardware is intact without loosening. The C4-5 interbody spacer is incorporated into the endplates. There also appears to be incorporation of strut graft at C6. Small fluid collection posterior to the C6 strut seen on MRI is not visible on this exam. Soft tissues and spinal canal: Unremarkable. Disc levels: The appearance of intervertebral disc spaces is unchanged since the comparison MRI. 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 Residual endplate spurring at C5-6 causes moderate central canal stenosis. Severe right and moderately severe left foraminal narrowing C5-6 is identified. Moderate to moderately severe bilateral foraminal narrowing C6-7 is worse on the left. Upper chest: There is some apical scarring.  Otherwise unremarkable. Other: None. IMPRESSION: No change in the appearance the cervical spine since the comparison MRI. Status post C4-7 fusion C6 corpectomy. Severe central canal stenosis at C6-7 and milder degree of central canal narrowing C5-6 is identified as described above. Bilateral foraminal narrowing at these levels is also unchanged in appearance. Electronically Signed   By: Drusilla Kanner M.D.   On: 04/18/2016 15:31   Mr Laqueta Jean ZO Contrast  Result Date: 04/17/2016 CLINICAL DATA:  Progressive upper and lower extremity weakness over the past few months. Previous ACDF from C4 through C7 which included C6  corpectomy. EXAM: MRI HEAD WITHOUT AND WITH CONTRAST MRI CERVICAL SPINE WITHOUT AND WITH CONTRAST TECHNIQUE: Multiplanar, multiecho pulse sequences of the brain and surrounding structures, and cervical spine, to include the craniocervical junction and cervicothoracic junction, were obtained without and with intravenous contrast. CONTRAST:  11mL MULTIHANCE GADOBENATE DIMEGLUMINE 529 MG/ML IV SOLN COMPARISON:  MRI of the cervical spine 12/29/2015. FINDINGS: MRI HEAD FINDINGS Brain: No evidence for acute infarction, hemorrhage, mass lesion, hydrocephalus, or extra-axial fluid. Normal for age cerebral volume. Minor white matter disease, not unexpected for age. Post infusion, no abnormal enhancement of the brain or meninges. Vascular: Flow voids are maintained throughout the carotid, basilar, and vertebral arteries. There are no areas of chronic hemorrhage. Skull and upper cervical spine: Normal marrow signal. Sinuses/Orbits: Negative. Other: None. MRI CERVICAL SPINE FINDINGS Alignment: Difficult to assess, given the extensive hardware. The area I most unsure about is at the location of the C6 corpectomy strut, where a large spur projects dorsally. CT of the cervical spine without contrast is needed for further assessment. Vertebrae: Susceptibility artifact obscures most of the C4 through C7 vertebrae. Above and below this marrow  signal is normal. There is a T2 hyperintense fluid collection, 5 x 3 x 15 mm (R-L x A-P x C-C), posterior to the C6 strut which is new/ increased from priors and is associated with posteriorly protruding bone into the ventral epidural space. Motion degraded post contrast imaging shows no worrisome enhancement. Cord: Severe cord compression at C6. Increasing T2 hyperintensity below the area of cord compression. AP diameter of the spinal canal at maximum compression measures 4 mm. Posterior Fossa, vertebral arteries, paraspinal tissues: No tonsillar herniation. Flow voids are maintained both  vertebral arteries. No neck masses. Disc levels: C2-3:  Unremarkable. C3-4:  Unremarkable. C4-5:  Unremarkable appearing post fusion interspace. C5-6: Mild to moderate canal stenosis is developing at this level, due to posteriorly projecting bone or OPLL, with effacement anterior subarachnoid space and mild cord flattening. BILATERAL C6 foraminal narrowing worse on the RIGHT. Canal diameter 6-7 mm. C6-7: Posterior projecting inferior residua of C6 vertebral body, or possibly OPLL, significantly narrows the spinal canal and both neural foramina, worse on the LEFT. Severe cord compression. Increasing cord edema/myelomalacia. It is difficult to confirm that the corpectomy cage is appropriately located. C7-T1:  Unremarkable. IMPRESSION: Unremarkable MRI of the brain without and with contrast. Status post C4-C7 ACDF with C6 corpectomy. 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 Canal diameter 4 mm. Increasingly prominent 5 x 3 x 15 mm T2 hyperintense fluid collection dorsal to but within the surgical construct, uncertain significance. CT of the cervical spine without contrast recommended for further evaluation. Electronically Signed   By: Elsie StainJohn T Curnes M.D.   On: 04/17/2016 19:17   Mr Cervical Spine W Wo Contrast  Result Date: 04/17/2016 CLINICAL DATA:  Progressive upper and lower extremity weakness over the past few months. Previous ACDF from C4 through C7 which included C6 corpectomy. EXAM: MRI HEAD WITHOUT AND WITH CONTRAST MRI CERVICAL SPINE WITHOUT AND WITH CONTRAST TECHNIQUE: Multiplanar, multiecho pulse sequences of the brain and surrounding structures, and cervical spine, to include the craniocervical junction and cervicothoracic junction, were obtained without and with intravenous contrast. CONTRAST:  11mL MULTIHANCE GADOBENATE  DIMEGLUMINE 529 MG/ML IV SOLN COMPARISON:  MRI of the cervical spine 12/29/2015. FINDINGS: MRI HEAD FINDINGS Brain: No evidence for acute infarction, hemorrhage, mass lesion, hydrocephalus, or extra-axial fluid. Normal for age cerebral volume. Minor white matter disease, not unexpected for age. Post infusion, no abnormal enhancement of the brain or meninges. Vascular: Flow voids are maintained throughout the carotid, basilar, and vertebral arteries. There are no areas of chronic hemorrhage. Skull and upper cervical spine: Normal marrow signal. Sinuses/Orbits: Negative. Other: None. MRI CERVICAL SPINE FINDINGS Alignment: Difficult to assess, given the extensive hardware. The area I most unsure about is at the location of the C6 corpectomy strut, where a large spur projects dorsally. CT of the cervical spine without contrast is needed for further assessment. Vertebrae: Susceptibility artifact obscures most of the C4 through C7 vertebrae. Above and below this marrow signal is normal. There is a T2 hyperintense fluid collection, 5 x 3 x 15 mm (R-L x A-P x C-C), posterior to the C6 strut which is new/ increased from priors and is associated with posteriorly protruding bone into the ventral epidural space. Motion degraded post contrast imaging shows no worrisome enhancement. Cord: Severe cord compression at C6. Increasing T2 hyperintensity below the area of cord compression. AP diameter of  the spinal canal at maximum compression measures 4 mm. Posterior Fossa, vertebral arteries, paraspinal tissues: No tonsillar herniation. Flow voids are maintained both vertebral arteries. No neck masses. Disc levels: C2-3:  Unremarkable. C3-4:  Unremarkable. C4-5:  Unremarkable appearing post fusion interspace. C5-6: Mild to moderate canal stenosis is developing at this level, due to posteriorly projecting bone or OPLL, with effacement anterior subarachnoid space and mild cord flattening. BILATERAL C6 foraminal narrowing worse on the  RIGHT. Canal diameter 6-7 mm. C6-7: Posterior projecting inferior residua of C6 vertebral body, or possibly OPLL, significantly narrows the spinal canal and both neural foramina, worse on the LEFT. Severe cord compression. Increasing cord edema/myelomalacia. It is difficult to confirm that the corpectomy cage is appropriately located. C7-T1:  Unremarkable. IMPRESSION: Unremarkable MRI of the brain without and with contrast. Status post C4-C7 ACDF with C6 corpectomy. 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 Canal diameter 4 mm. Increasingly prominent 5 x 3 x 15 mm T2 hyperintense fluid collection dorsal to but within the surgical construct, uncertain significance. CT of the cervical spine without contrast recommended for further evaluation. Electronically Signed   By: Elsie StainJohn T Curnes M.D.   On: 04/17/2016 19:17       Subjective: She is feeling well, breathing well, stable   Discharge Exam: Vitals:   04/20/16 2029 04/21/16 0419  BP: (!) 110/56 (!) 106/54  Pulse: 87 86  Resp: 16 18  Temp: 97.5 F (36.4 C) 97.5 F (36.4 C)   Vitals:   04/20/16 1952 04/20/16 2029 04/21/16 0419 04/21/16 0857  BP:  (!) 110/56 (!) 106/54   Pulse: 86 87 86   Resp: 16 16 18    Temp:  97.5 F (36.4 C) 97.5 F (36.4 C)   TempSrc:  Oral Oral   SpO2: 92% 94% 95% 93%  Weight:      Height:        General: Pt is alert, awake, not in acute distress Cardiovascular: RRR, S1/S2 +, no rubs, no gallops Respiratory: CTA bilaterally, no wheezing, no rhonchi Abdominal: Soft, NT, ND, bowel sounds + Extremities: no edema, no cyanosis    The results of significant diagnostics from this hospitalization (including imaging, microbiology, ancillary and laboratory) are listed below for reference.     Microbiology: No results found for this or any  previous visit (from the past 240 hour(s)).   Labs: BNP (last 3 results) No results for input(s): BNP in the last 8760 hours. Basic Metabolic Panel:  Recent Labs Lab 04/16/16 0654  NA 142  K 4.1  CL 101  CO2 32  GLUCOSE 95  BUN 17  CREATININE 0.51  CALCIUM 9.7   Liver Function Tests: No results for input(s): AST, ALT, ALKPHOS, BILITOT, PROT, ALBUMIN in the last 168 hours. No results for input(s): LIPASE, AMYLASE in the last 168 hours. No results for input(s): AMMONIA in the last 168 hours. CBC:  Recent Labs Lab 04/16/16 0654 04/17/16 0342 04/18/16 0445 04/19/16 0310  WBC 7.8 7.5 7.6 9.3  NEUTROABS 5.3  --   --   --   HGB 14.8 14.3 13.9 13.6  HCT 46.0 45.7 44.1 43.6  MCV 97.5 99.8 98.9 98.2  PLT 274 259 266 233   Cardiac Enzymes:  Recent Labs Lab 04/16/16 1119 04/16/16 1715 04/16/16 2218  TROPONINI 0.60* 0.39* 0.26*   BNP: Invalid input(s): POCBNP CBG: No  results for input(s): GLUCAP in the last 168 hours. D-Dimer No results for input(s): DDIMER in the last 72 hours. Hgb A1c No results for input(s): HGBA1C in the last 72 hours. Lipid Profile No results for input(s): CHOL, HDL, LDLCALC, TRIG, CHOLHDL, LDLDIRECT in the last 72 hours. Thyroid function studies No results for input(s): TSH, T4TOTAL, T3FREE, THYROIDAB in the last 72 hours.  Invalid input(s): FREET3 Anemia work up No results for input(s): VITAMINB12, FOLATE, FERRITIN, TIBC, IRON, RETICCTPCT in the last 72 hours. Urinalysis    Component Value Date/Time   COLORURINE YELLOW 01/14/2016 1814   APPEARANCEUR CLEAR 01/14/2016 1814   LABSPEC 1.012 01/14/2016 1814   PHURINE 6.5 01/14/2016 1814   GLUCOSEU NEGATIVE 01/14/2016 1814   HGBUR NEGATIVE 01/14/2016 1814   HGBUR negative 08/09/2008 0825   BILIRUBINUR NEGATIVE 01/14/2016 1814   BILIRUBINUR n 01/31/2015 1136   KETONESUR NEGATIVE 01/14/2016 1814   PROTEINUR NEGATIVE 01/14/2016 1814   UROBILINOGEN 0.2 01/31/2015 1136   UROBILINOGEN 0.2  08/09/2008 0825   NITRITE NEGATIVE 01/14/2016 1814   LEUKOCYTESUR NEGATIVE 01/14/2016 1814   Sepsis Labs Invalid input(s): PROCALCITONIN,  WBC,  LACTICIDVEN Microbiology No results found for this or any previous visit (from the past 240 hour(s)).   Time coordinating discharge: Over 30 minutes  SIGNED:   Alba Cory, MD  [facility] 04/21/2016, 12:47 PM Pager   If 7PM-7AM, please contact night-coverage [email or web address] Password [email or web address]

## 2016-04-21 NOTE — Progress Notes (Signed)
Pt successfully voided s/p foley discontinuation.  [facility] arranged for transport, expected arrival 6:30-7:00 this evening.

## 2016-04-21 NOTE — Progress Notes (Signed)
[facility] has arrived for d/c to pt home. Husband awaiting for patients arrival and upset about delay in her coming home. Pt explained d/c instructions and packet given to PT[facility]. Pt in no apparent signs of distress. VSS

## 2016-04-21 NOTE — Care Management Note (Signed)
Case Management Note Donn PieriniKristi Mileydi Milsap RN, BSN Unit 2W-Case Manager [phone]  Patient Details  Name: Tara Cruz MRN: [id number] Date of Birth: 1950-12-10  Subjective/Objective:   Pt admitted with spondylosis                 Action/Plan: PTA pt lived at home with SO, and 24/hr caregivers, plan for  Pt to return home with caregivers, will need home 02 now- order has been placed- spoke with pt who is agreeable to using [facility]-for DME needs- per pt she lives in [place]- address is not correct in epic- correct address is [address]) [place] [id number]- phone; [phone] for Paulina FusiJerry Williams- SO. Pt will be transported via EMS. Referral for home 02 [facility] - [facility] 02 will be delivered between 3:30 and 6 this afternoon. Will call for transport after home 02 delivered.   Expected Discharge Date:  04/21/16              Expected Discharge Plan:  Home/Self Care  In-House Referral:     Discharge planning Services  CM Consult  Post Acute Care Choice:  Durable Medical Equipment Choice offered to:  Patient  DME Arranged:  Oxygen DME Agency:  [facility]  HH [facility]    [facility] Agency:     Status of Service:  Completed, signed off  If discussed at Long Length of Stay Meetings, dates discussed:    Additional Comments:  Darrold SpanWebster, Brannon Levene Hall, RN 04/21/2016, 1:45 PM

## 2016-04-21 NOTE — Progress Notes (Signed)
Foley catheter discontinued per verbal order at 1300.  Pt due to void by 1900.

## 2016-05-03 ENCOUNTER — Telehealth: Payer: Self-pay | Admitting: Cardiology

## 2016-05-03 ENCOUNTER — Telehealth: Payer: Self-pay | Admitting: Internal Medicine

## 2016-05-03 NOTE — Telephone Encounter (Signed)
Pts husband is calling and would like to have this today pt is having a hard time breathing.

## 2016-05-03 NOTE — Telephone Encounter (Signed)
Pt was in hospital and was given rx for oxygen, and was told she would get a nebulizer, she hasn't gotten it yet and [facility] to [facility], they need an rx and ov note, never saw her in office but Delton Seeelson saw her 04-16-16 in hospital. Husband wants a call from us letting him know the status-this all came from [facility], they tried to help but they wouldn't take the info from them since the order came from Cardiology.  Office [phone] or Fax [phone]

## 2016-05-03 NOTE — Telephone Encounter (Signed)
Called husband Dorene SorrowJerry back and told him I spoke to [place] with Cardiology and she is going to contact [place] and send [place] orders for Nebulizer and will call you to let you know if done. Dorene SorrowJerry verbalized understanding.

## 2016-05-03 NOTE — Telephone Encounter (Signed)
Called Cardiology and [facility] explained situation of pt to her and asked her if she could send orders to [facility] for [employer] for pt. Told her Cardiology wrote orders for Oxygen and gave her Rx's for Nebulizer machine but no machine. Melissa verbalized understanding. Told her I tried to send orders but [facility] would not accept them from me due to no notes since 10/2015. Melissa verbalized understanding and said she will take care of it. Gave her number for [facility] and also asked her to contact pt's husband Dorene SorrowJerry and let him know if able to do. Melissa verbalized understanding.

## 2016-05-03 NOTE — Telephone Encounter (Signed)
Called and spoke to pt's husband Dorene SorrowJerry told him I faxed order [facility] to [facility] because Cardiology could not help and [facility]yl at [facility] the order with notes from hospital. Told him he could probably pickup tomorrow. Dorene SorrowJerry verbalized understanding.

## 2016-05-03 NOTE — Telephone Encounter (Signed)
This is a Dr. Nanetta BattyJonathan Cruz pt at our [facility] in Cardiology if this helps at all.  Appears that Internal Medicine Dr Sunnie Nielsenegalado discharged the pt with O2 and ordered to follow-up with PCP.  Ordering MD was not Dr Delton SeeNelson.  She admitted the pt to the hospital, but did not discharge this pt.  DME is provided by Internal Medicine [facility]ed [facility] to inform them of ordering providers.

## 2016-05-03 NOTE — Telephone Encounter (Signed)
Called [facility] asked her if I could fax order over.Elnita MaxwellCheryl said need office notes. Told her we have not seen pt since 10/2015 I can send Hospital discharge notes. Elnita MaxwellCheryl said she needs office notes. Asked her does pt need to contact Cardiology? Elnita MaxwellCheryl said yes.  Called Dorene SorrowJerry told him [facility] will not accept orders due to no office notes need to contact Cardiology. Dorene SorrowJerry said he does not know who to call. Told him I will contact Cardiology and see if I can get them to send order and call you back.Dorene SorrowJerry verbalized understanding.

## 2016-05-03 NOTE — Telephone Encounter (Signed)
Checked in chart looks like pt was ordered oxygen when [facility] left [facility]. [facility] and spoke to [place] and asked her if pt received Oxygen or Nebulizer from them. Selena BattenKim said they only received order for Oxygen and was delivered on 11/22. Told her okay I will check with pt when they call back if they need Nebulizer also.

## 2016-05-03 NOTE — Telephone Encounter (Signed)
Pt needs new rx for nebulizer for oxygen sent to [facility] . Pt husband will callback with phone number

## 2016-05-03 NOTE — Telephone Encounter (Signed)
Left message on voicemail to call office. [facility] and they do not have orders for Nebulizer or Oxygen.

## 2016-05-03 NOTE — Telephone Encounter (Signed)
Pt's husband Dorene SorrowJerry called back and said pt needs Nebulizer machine has medication but no machine. Asked him if he wants [facility]? He said [facility] if possible so he can go pick it up because that is where pt is. Told him okay I will call them back to get fax # and send order over. Dorene SorrowJerry verbalized understanding.

## 2016-05-03 NOTE — Telephone Encounter (Signed)
Called [facility] back again and spoke to [place] asked what I can do to get this ordered? Elnita MaxwellCheryl said needs notes saying to order. Told her all I can send is hospital discharge note saying pt is being treated with Nebulizer Tx's and I can have Dr.K write order. Cherly said okay. Order written for [employer] and discharge summary from hospital faxed to [phone].

## 2016-05-03 NOTE — Telephone Encounter (Signed)
Pt husband called back with phone number.  Number is [phone]

## 2016-05-04 ENCOUNTER — Inpatient Hospital Stay (HOSPITAL_COMMUNITY): Payer: Medicare Other

## 2016-05-04 ENCOUNTER — Inpatient Hospital Stay (HOSPITAL_COMMUNITY)
Admission: AD | Admit: 2016-05-04 | Discharge: 2016-05-16 | DRG: 166 | Disposition: A | Discharge status: Other Acute Inpatient Hospital | Payer: Medicare Other | Source: Other Acute Inpatient Hospital | Attending: Pulmonary Disease | Admitting: Pulmonary Disease

## 2016-05-04 DIAGNOSIS — F17210 Nicotine dependence, cigarettes, uncomplicated: Secondary | ICD-10-CM | POA: Diagnosis present

## 2016-05-04 DIAGNOSIS — B9620 Unspecified Escherichia coli [E. coli] as the cause of diseases classified elsewhere: Secondary | ICD-10-CM | POA: Diagnosis present

## 2016-05-04 DIAGNOSIS — Z90710 Acquired absence of both cervix and uterus: Secondary | ICD-10-CM

## 2016-05-04 DIAGNOSIS — Y95 Nosocomial condition: Secondary | ICD-10-CM | POA: Diagnosis present

## 2016-05-04 DIAGNOSIS — J189 Pneumonia, unspecified organism: Secondary | ICD-10-CM

## 2016-05-04 DIAGNOSIS — J9622 Acute and chronic respiratory failure with hypercapnia: Principal | ICD-10-CM | POA: Diagnosis present

## 2016-05-04 DIAGNOSIS — J9811 Atelectasis: Secondary | ICD-10-CM

## 2016-05-04 DIAGNOSIS — K5900 Constipation, unspecified: Secondary | ICD-10-CM | POA: Diagnosis not present

## 2016-05-04 DIAGNOSIS — G9349 Other encephalopathy: Secondary | ICD-10-CM | POA: Diagnosis present

## 2016-05-04 DIAGNOSIS — M21379 Foot drop, unspecified foot: Secondary | ICD-10-CM | POA: Diagnosis present

## 2016-05-04 DIAGNOSIS — Z981 Arthrodesis status: Secondary | ICD-10-CM

## 2016-05-04 DIAGNOSIS — J440 Chronic obstructive pulmonary disease with acute lower respiratory infection: Secondary | ICD-10-CM | POA: Diagnosis present

## 2016-05-04 DIAGNOSIS — E785 Hyperlipidemia, unspecified: Secondary | ICD-10-CM | POA: Diagnosis present

## 2016-05-04 DIAGNOSIS — G8250 Quadriplegia, unspecified: Secondary | ICD-10-CM

## 2016-05-04 DIAGNOSIS — N390 Urinary tract infection, site not specified: Secondary | ICD-10-CM | POA: Diagnosis not present

## 2016-05-04 DIAGNOSIS — J441 Chronic obstructive pulmonary disease with (acute) exacerbation: Secondary | ICD-10-CM | POA: Diagnosis not present

## 2016-05-04 DIAGNOSIS — M779 Enthesopathy, unspecified: Secondary | ICD-10-CM | POA: Diagnosis not present

## 2016-05-04 DIAGNOSIS — I252 Old myocardial infarction: Secondary | ICD-10-CM

## 2016-05-04 DIAGNOSIS — J9620 Acute and chronic respiratory failure, unspecified whether with hypoxia or hypercapnia: Secondary | ICD-10-CM

## 2016-05-04 DIAGNOSIS — I10 Essential (primary) hypertension: Secondary | ICD-10-CM | POA: Diagnosis present

## 2016-05-04 DIAGNOSIS — E440 Moderate protein-calorie malnutrition: Secondary | ICD-10-CM | POA: Diagnosis not present

## 2016-05-04 DIAGNOSIS — Z9841 Cataract extraction status, right eye: Secondary | ICD-10-CM

## 2016-05-04 DIAGNOSIS — Z79899 Other long term (current) drug therapy: Secondary | ICD-10-CM

## 2016-05-04 DIAGNOSIS — Z961 Presence of intraocular lens: Secondary | ICD-10-CM | POA: Diagnosis present

## 2016-05-04 DIAGNOSIS — J9690 Respiratory failure, unspecified, unspecified whether with hypoxia or hypercapnia: Secondary | ICD-10-CM

## 2016-05-04 DIAGNOSIS — M6281 Muscle weakness (generalized): Secondary | ICD-10-CM | POA: Diagnosis not present

## 2016-05-04 DIAGNOSIS — G629 Polyneuropathy, unspecified: Secondary | ICD-10-CM | POA: Diagnosis not present

## 2016-05-04 DIAGNOSIS — Z681 Body mass index (BMI) 19 or less, adult: Secondary | ICD-10-CM | POA: Diagnosis not present

## 2016-05-04 DIAGNOSIS — Z7951 Long term (current) use of inhaled steroids: Secondary | ICD-10-CM

## 2016-05-04 DIAGNOSIS — G9529 Other cord compression: Secondary | ICD-10-CM | POA: Diagnosis not present

## 2016-05-04 DIAGNOSIS — I2510 Atherosclerotic heart disease of native coronary artery without angina pectoris: Secondary | ICD-10-CM | POA: Diagnosis present

## 2016-05-04 DIAGNOSIS — J9621 Acute and chronic respiratory failure with hypoxia: Secondary | ICD-10-CM | POA: Diagnosis not present

## 2016-05-04 DIAGNOSIS — R739 Hyperglycemia, unspecified: Secondary | ICD-10-CM | POA: Diagnosis present

## 2016-05-04 DIAGNOSIS — IMO0001 Reserved for inherently not codable concepts without codable children: Secondary | ICD-10-CM

## 2016-05-04 DIAGNOSIS — Z9842 Cataract extraction status, left eye: Secondary | ICD-10-CM

## 2016-05-04 DIAGNOSIS — R531 Weakness: Secondary | ICD-10-CM | POA: Diagnosis not present

## 2016-05-04 DIAGNOSIS — R0603 Acute respiratory distress: Secondary | ICD-10-CM

## 2016-05-04 DIAGNOSIS — L8990 Pressure ulcer of unspecified site, unspecified stage: Secondary | ICD-10-CM | POA: Insufficient documentation

## 2016-05-04 DIAGNOSIS — Z978 Presence of other specified devices: Secondary | ICD-10-CM

## 2016-05-04 DIAGNOSIS — T83031A Leakage of indwelling urethral catheter, initial encounter: Secondary | ICD-10-CM | POA: Diagnosis not present

## 2016-05-04 LAB — CBC WITH DIFFERENTIAL/PLATELET
Basophils Absolute: 0 10*3/uL (ref 0.0–0.1)
Basophils Relative: 0 %
Eosinophils Absolute: 0 10*3/uL (ref 0.0–0.7)
Eosinophils Relative: 0 %
HEMATOCRIT: 49.3 % — AB (ref 36.0–46.0)
HEMOGLOBIN: 15.3 g/dL — AB (ref 12.0–15.0)
LYMPHS ABS: 0.5 10*3/uL — AB (ref 0.7–4.0)
LYMPHS PCT: 8 %
MCH: 31.4 pg (ref 26.0–34.0)
MCHC: 31 g/dL (ref 30.0–36.0)
MCV: 101.2 fL — AB (ref 78.0–100.0)
MONOS PCT: 4 %
Monocytes Absolute: 0.3 10*3/uL (ref 0.1–1.0)
NEUTROS PCT: 88 %
Neutro Abs: 5.5 10*3/uL (ref 1.7–7.7)
Platelets: 262 10*3/uL (ref 150–400)
RBC: 4.87 MIL/uL (ref 3.87–5.11)
RDW: 12.4 % (ref 11.5–15.5)
WBC: 6.2 10*3/uL (ref 4.0–10.5)

## 2016-05-04 LAB — GLUCOSE, CAPILLARY: GLUCOSE-CAPILLARY: 133 mg/dL — AB (ref 65–99)

## 2016-05-04 LAB — POCT I-STAT 3, ART BLOOD GAS (G3+)
Acid-Base Excess: 14 mmol/L — ABNORMAL HIGH (ref 0.0–2.0)
BICARBONATE: 44.6 mmol/L — AB (ref 20.0–28.0)
O2 SAT: 93 %
PCO2 ART: 85.4 mmHg — AB (ref 32.0–48.0)
PH ART: 7.326 — AB (ref 7.350–7.450)
Patient temperature: 98.6
TCO2: 47 mmol/L (ref 0–100)
pO2, Arterial: 79 mmHg — ABNORMAL LOW (ref 83.0–108.0)

## 2016-05-04 LAB — COMPREHENSIVE METABOLIC PANEL
ALBUMIN: 3.5 g/dL (ref 3.5–5.0)
ALK PHOS: 59 U/L (ref 38–126)
ALT: 20 U/L (ref 14–54)
ANION GAP: 2 — AB (ref 5–15)
AST: 19 U/L (ref 15–41)
BUN: 29 mg/dL — AB (ref 6–20)
CALCIUM: 10.5 mg/dL — AB (ref 8.9–10.3)
CO2: 43 mmol/L — AB (ref 22–32)
Chloride: 97 mmol/L — ABNORMAL LOW (ref 101–111)
Creatinine, Ser: 0.41 mg/dL — ABNORMAL LOW (ref 0.44–1.00)
GFR calc Af Amer: >60 mL/min (ref >60)
GFR calc non Af Amer: >60 mL/min (ref >60)
GLUCOSE: 144 mg/dL — AB (ref 65–99)
POTASSIUM: 4.4 mmol/L (ref 3.5–5.1)
SODIUM: 142 mmol/L (ref 135–145)
Total Bilirubin: 0.7 mg/dL (ref 0.3–1.2)
Total Protein: 5.9 g/dL — ABNORMAL LOW (ref 6.5–8.1)

## 2016-05-04 LAB — PHOSPHORUS: Phosphorus: 2.6 mg/dL (ref 2.5–4.6)

## 2016-05-04 LAB — MAGNESIUM: Magnesium: 2.2 mg/dL (ref 1.7–2.4)

## 2016-05-04 LAB — MRSA PCR SCREENING: MRSA by PCR: NEGATIVE

## 2016-05-04 MED ORDER — FAMOTIDINE IN NACL 20-0.9 MG/50ML-% IV SOLN
20.0000 mg | Freq: Two times a day (BID) | INTRAVENOUS | Status: DC
  Administered 2016-05-04 – 2016-05-06 (×4): 20 mg via INTRAVENOUS
  Filled 2016-05-04 (×6): qty 50

## 2016-05-04 MED ORDER — HEPARIN SODIUM (PORCINE) 5000 UNIT/ML IJ SOLN
5000.0000 [IU] | Freq: Three times a day (TID) | INTRAMUSCULAR | Status: DC
  Administered 2016-05-04 – 2016-05-16 (×36): 5000 [IU] via SUBCUTANEOUS
  Filled 2016-05-04 (×37): qty 1

## 2016-05-04 MED ORDER — CHLORHEXIDINE GLUCONATE 0.12 % MT SOLN
15.0000 mL | Freq: Two times a day (BID) | OROMUCOSAL | Status: DC
  Administered 2016-05-04 – 2016-05-10 (×12): 15 mL via OROMUCOSAL

## 2016-05-04 MED ORDER — DEXTROSE-NACL 5-0.45 % IV SOLN
INTRAVENOUS | Status: DC
  Administered 2016-05-04 – 2016-05-10 (×5): via INTRAVENOUS
  Administered 2016-05-12: 10 mL/h via INTRAVENOUS

## 2016-05-04 MED ORDER — SODIUM CHLORIDE 0.9 % IV SOLN: 250.0000 mL | INTRAVENOUS | Status: DC | PRN

## 2016-05-04 MED ORDER — ORAL CARE MOUTH RINSE
15.0000 mL | Freq: Two times a day (BID) | OROMUCOSAL | Status: DC
  Administered 2016-05-05 (×2): 15 mL via OROMUCOSAL

## 2016-05-04 NOTE — Progress Notes (Signed)
Patient taken off of NIV for break;placed on 2 L [place]. Patient tolerating well at this time. Per Dr. Jamison NeighborNestor 4 hours on NIV and 2 hours off for breaks. RT will continue to monitor patient.

## 2016-05-04 NOTE — Progress Notes (Signed)
Spoke w/ [facility]. She has been accepted to the Neuro-critical care Dr Sherryll BurgerShah. Currently no beds so we will cont supportive care in the mean time.   Simonne MartinetPeter E Sela Falk ACNP-BC [facility] Pulmonary/Critical Care Pager # [phone] OR # [phone] if no answer

## 2016-05-04 NOTE — Discharge Summary (Signed)
Physician Discharge Summary       Patient ID: Tara Cruz MRN: [id number] DOB/AGE: [birth year] 65 y.o.  Duplicate

## 2016-05-04 NOTE — H&P (Signed)
Name: Tara Bottcheratricia A Tisdale MRN: [id number] DOB: [birth year]    ADMISSION DATE:  05/04/2016   CHIEF COMPLAINT:  Shortness of Breath  BRIEF PATIENT DESCRIPTION:  65 year old female w/ prior C4-C7 ACDF back in June 2017. 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 Since this time has had progressive ascending weakness, weight loss and recurrent acute on chronic hypercarbia. She was actually referred to Dr Tillie FantasiaKarikara [facility] Dr Shon BatonBrooks but did not get to the appointment). She was admitted to OSH on 12/4 w/ cc: dyspnea, then [facility] for progressive hypercarbic respiratory failure.   SIGNIFICANT EVENTS    STUDIES:  10/21/15 EMG/NCS [Dr. 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 Considerable artifact from the fusion hardware. 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 Abnormal T2 signal in the cord immediately distal to that consistent with gliosis. - Facet arthropathy at C2-3 and C3-4 with neural foraminal stenosis that could be symptomatic. 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 Canal diameter 4 mm. Increasingly prominent 5 x 3 x 15 mm T2 hyperintense fluid collection dorsal to but within the surgical construct, uncertain significance.   HISTORY OF PRESENT ILLNESS:   65 y/o female with history of COPD, Emphysema, RBBB, HTN, HLD and CAD presents 12/5 with shortness of breath.   Pt  had C4-5 discectomy and C6 corpectomy with C4-7 fusion in 10/2015 w/ what was felt to be residual cord compression at C6. She's followed by local neurology last seen Oct 10: who also note her Neuropathy labs to be non-diagnostic.  According to patient and significant other, she has steadily declined along with 50 pund weight loss since the surgery. Post-op   S/P surgery she was able to walk with the use of a walker and was able to begin rehabilitation.  Over the course of rehab, she became weaker, starting in her lower extremities.  The weakness progressed in an ascending order with weakness in her arms as well.  Since 11/23 she has progressed from being able to hold herself up while sitting in a chair to having limited control of sitting upright.  The progression has progressed to the respiratory muscles now and she is having greater difficulty breathing.  She has to have home health 7 days week now to assist with ADLs. [facility] breath.  [facility] for further work up.       PAST MEDICAL HISTORY :   has a past medical history of Abnormal stress test (01/03/2013); Allergy; Arthritis; Asthma; CAD (coronary artery disease) (01/30/2013); COPD (chronic obstructive pulmonary disease) (HCC); Depression; Emphysema lung (HCC); Fall (01/13/2016); H/O cardiovascular stress test (2014); Hyperlipidemia; Hypertension; Neuropathy (HCC); Right bundle branch block; and Tobacco use.  has a past surgical history that includes Abdominal hysterectomy (1978); Foot surgery; Cardiac catheterization (01/30/2013); left heart catheterization with coronary angiogram (N/A, 01/30/2013); Hand surgery (Left); Eye surgery; Tubal ligation; Back surgery; and  Anterior cervical decomp/discectomy fusion (N/A, 11/06/2015). Prior to Admission medications   Medication Sig Start Date End Date Taking? Authorizing Provider  albuterol (PROVENTIL HFA;VENTOLIN HFA) 108 (90 Base) MCG/ACT  inhaler Inhale 2 puffs into the lungs every 4 (four) hours as needed for wheezing or shortness of breath.     Historical Provider, MD  Aspirin (ECOTRIN PO) Take 81 mg by mouth daily.    Historical Provider, MD  celecoxib (CELEBREX) 200 MG capsule Take 200 mg by mouth daily.    Historical Provider, MD  cetirizine (ZYRTEC) 10 MG tablet Take 10 mg by mouth daily.    Historical Provider, MD  estradiol (ESTRACE) 0.5 MG tablet Take 0.5 mg by mouth daily.    Historical Provider, MD  ezetimibe-simvastatin (VYTORIN) 10-20 MG tablet Take 1 tablet by mouth daily.    Historical Provider, MD  feeding supplement, ENSURE ENLIVE, (ENSURE ENLIVE) LIQD Take 237 mLs by mouth 3 (three) times daily between meals. 04/21/16   Belkys A Regalado, MD  Fluticasone-Salmeterol (ADVAIR) 250-50 MCG/DOSE AEPB Inhale 1 puff into the lungs 2 (two) times daily as needed (For shortness of breath.).    Historical Provider, MD  guaiFENesin (MUCINEX) 600 MG 12 hr tablet Take 1 tablet (600 mg total) by mouth 2 (two) times daily. 04/21/16   Belkys A Regalado, MD  ipratropium-albuterol (DUONEB) 0.5-2.5 (3) MG/3ML SOLN Take 3 mLs by nebulization 2 (two) times daily. 04/21/16   Belkys A Regalado, MD  naproxen sodium (ANAPROX) 220 MG tablet Take 220 mg by mouth 2 (two) times daily as needed (for pain or headache).     Historical Provider, MD  pantoprazole (PROTONIX) 40 MG tablet Take 1 tablet (40 mg total) by mouth daily at 6 (six) AM. 04/22/16   Belkys A Regalado, MD  polyethylene glycol (MIRALAX / GLYCOLAX) packet Take 17 g by mouth daily as needed for moderate constipation.     Historical Provider, MD  sertraline (ZOLOFT) 50 MG tablet Take 50 mg by mouth daily.    Historical Provider, MD  solifenacin (VESICARE) 5 MG tablet Take 5 mg by mouth daily.    Historical Provider, MD   No Known Allergies  FAMILY HISTORY:  family history includes Alzheimer's disease in her mother; Arthritis in her mother, paternal grandmother, and sister; Asthma in  her sister; Breast cancer in her cousin and sister; Coronary artery disease in her father, maternal grandfather, mother, paternal grandfather, and sister; Hypertension in her sister; Rheum arthritis in her mother. SOCIAL HISTORY:  reports that she has been smoking Cigarettes.  She has a 12.50 pack-year smoking history. She has never used smokeless tobacco. She reports that she drinks alcohol. She reports that she does not use drugs.  REVIEW OF SYSTEMS:   Constitutional: Negative for fever, chills, + weight loss,+ malaise/fatigue and diaphoresis.  HENT: Negative for hearing loss, ear pain, nosebleeds, congestion, sore throat, neck pain, tinnitus and ear discharge.   Eyes: Negative for blurred vision, double vision, photophobia, pain, discharge and redness.  Respiratory: Negative for +cough, hemoptysis, sputum production, +shortness of breath, wheezing and stridor.   Cardiovascular: Negative for chest pain, palpitations, orthopnea, claudication, leg swelling and PND.  Gastrointestinal: Negative for heartburn, nausea, vomiting, abdominal pain, diarrhea, constipation, blood in stool and melena.  Genitourinary: Negative for dysuria, urgency, frequency, hematuria and flank pain.  Musculoskeletal: Negative for myalgias, back pain, joint pain and falls.+ for weakness  Skin: Negative for itching and rash.  Neurological: Negative for dizziness, tingling, tremors, sensory change, speech change, focal weakness, seizures,  loss of consciousness, + weakness and headaches.  Endo/Heme/Allergies: Negative for environmental allergies and polydipsia. Does not bruise/bleed easily.  SUBJECTIVE:  Generalized weakness and difficulty breathing  VITAL SIGNS: Temp:  [97.2 F (36.2 C)] 97.2 F (36.2 C) (12/05 1207) Pulse Rate:  [77-89] 77 (12/05 1400) Resp:  [14-29] 16 (12/05 1400) BP: (140-150)/(72-79) 140/79 (12/05 1400) SpO2:  [92 %-95 %] 94 % (12/05 1400) FiO2 (%):  [30 %] 30 % (12/05 1400) Weight:  [108 lb 7.5  oz (49.2 kg)] 108 lb 7.5 oz (49.2 kg) (12/05 1205)  PHYSICAL EXAMINATION: General:  Ill appearing female, difficulty to speaking, labored breathing Neuro:  A/O x 4, able to follow commands 1/5 HEENT:  Normocephalic no drainage from mouth, nose or ears. Cardiovascular:  S1S2 no MGR Lungs:  Labored, clear to ascultation.    Abdomen:  Soft non-distended Musculoskeletal:  generalized weakness, bilateral foot drop Skin:  Warm and dry, intact ABG    Component Value Date/Time   PHART 7.326 (L) 05/04/2016 1214   PCO2ART 85.4 (HH) 05/04/2016 1214   PO2ART 79.0 (L) 05/04/2016 1214   HCO3 44.6 (H) 05/04/2016 1214   TCO2 47 05/04/2016 1214   O2SAT 93.0 05/04/2016 1214   No results for input(s): NA, K, CL, CO2, BUN, CREATININE, GLUCOSE in the last 168 hours. No results for input(s): HGB, HCT, WBC, PLT in the last 168 hours. Dg Chest Port 1 View  Result Date: 05/04/2016 CLINICAL DATA:  Worsening respiratory failure EXAM: PORTABLE CHEST 1 VIEW COMPARISON:  04/10/2016 FINDINGS: Cardiac shadow is within normal limits. The lungs are well aerated bilaterally. No focal infiltrate or sizable effusion is seen. Postsurgical changes are noted in the cervical spine. IMPRESSION: No acute intrathoracic abnormality noted. Electronically Signed   By: Alcide Clever M.D.   On: 05/04/2016 14:25    ASSESSMENT / PLAN:  Pulmonary A: Acute on chronic hypercarbic respiratory failure in setting of neuro-muscular weakness.  P: BiPAP DNI unless accepted at [facility] at which case she would require intubation to facilitate safe transport.  CXR Continuous SpO2 ABG  Cardiology A: Hypertension, Hyperlipidemia  CAD  P: Continuous telemetry SCDs Strict I&O  Neurological A: Encephalopathy in setting of hypercarbic respiratory failure Progressive neuro-muscular weakness. 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  P: Supportive  care Avoid sedating drugs unless airway protected at which time she would be intubated for safe transport.  I will defer from further neurological testing as there is no need to duplicate what will likely all be repeated at [facility]  Musculoskeletal A: Foot drop   P: Podus boot (rotate q shift)  Discussion: 65 year old female w/ prior C4-C7 ACDF back in June 2017. 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 Since this time has had progressive ascending weakness, weight loss and recurrent acute on chronic hypercarbia. She was actually referred to Dr Christene Lye (neuro surg at [facility] by Dr Shon Baton but did not get to the appointment). She was admitted to OSH on 12/4 w/ cc: dyspnea, then later referred to [facility] on 12/5 for progressive hypercarbic respiratory failure. We have little to offer her at [facility]. If indeed a surgical intervention is indicated then she would be best served by [facility] [facility]. IF it is felt that her C-spine compression does not explain her progressive weakness then transfer to an academic institute would still be best to  facilitate EMG/NCS and further neurological testing  My critical care time 90 minutes   Simonne Martinet ACNP-BC [facility] Pulmonary/Critical Care Pager # [phone] OR # [phone] if no answer  05/04/2016, 2:36 PM

## 2016-05-04 NOTE — Consult Note (Signed)
Made aware of admission by CC NP H+P note reviewed Agree with plan No new recommendations at this time Will continue to monitor progress If any questions or concerns arise please feel free to contact me [phone]

## 2016-05-04 NOTE — Progress Notes (Signed)
Patient requested to go back on bipap.  Patient is currently tolerating well.  Will continue to monitor.

## 2016-05-05 ENCOUNTER — Inpatient Hospital Stay (HOSPITAL_COMMUNITY): Payer: Medicare Other

## 2016-05-05 DIAGNOSIS — L8990 Pressure ulcer of unspecified site, unspecified stage: Secondary | ICD-10-CM | POA: Insufficient documentation

## 2016-05-05 DIAGNOSIS — E440 Moderate protein-calorie malnutrition: Secondary | ICD-10-CM

## 2016-05-05 LAB — CBC
HCT: 48 % — ABNORMAL HIGH (ref 36.0–46.0)
Hemoglobin: 14.6 g/dL (ref 12.0–15.0)
MCH: 30.9 pg (ref 26.0–34.0)
MCHC: 30.4 g/dL (ref 30.0–36.0)
MCV: 101.5 fL — ABNORMAL HIGH (ref 78.0–100.0)
PLATELETS: 273 10*3/uL (ref 150–400)
RBC: 4.73 MIL/uL (ref 3.87–5.11)
RDW: 12.8 % (ref 11.5–15.5)
WBC: 8.2 10*3/uL (ref 4.0–10.5)

## 2016-05-05 LAB — BLOOD GAS, ARTERIAL
ACID-BASE EXCESS: 16.7 mmol/L — AB (ref 0.0–2.0)
Acid-Base Excess: 12.8 mmol/L — ABNORMAL HIGH (ref 0.0–2.0)
Bicarbonate: 42.6 mmol/L — ABNORMAL HIGH (ref 20.0–28.0)
Bicarbonate: 37.6 mmol/L — ABNORMAL HIGH (ref 20.0–28.0)
DRAWN BY: 44126
Delivery systems: POSITIVE
Drawn by: 362771
Expiratory PAP: 5
FIO2: 30
FIO2: 100
INSPIRATORY PAP: 13
LHR: 10 {breaths}/min
O2 SAT: 96.1 %
O2 SAT: 99.6 %
PATIENT TEMPERATURE: 98.6
PCO2 ART: 71.8 mmHg — AB (ref 32.0–48.0)
PCO2 ART: 55 mmHg — AB (ref 32.0–48.0)
PEEP: 5 cmH2O
PH ART: 7.45 (ref 7.350–7.450)
Patient temperature: 98.6
RATE: 15 resp/min
VT: 400 mL
pH, Arterial: 7.391 (ref 7.350–7.450)
pO2, Arterial: 80.7 mmHg — ABNORMAL LOW (ref 83.0–108.0)
pO2, Arterial: 499 mmHg — ABNORMAL HIGH (ref 83.0–108.0)

## 2016-05-05 LAB — MAGNESIUM
MAGNESIUM: 1.8 mg/dL (ref 1.7–2.4)
Magnesium: 2 mg/dL (ref 1.7–2.4)

## 2016-05-05 LAB — PHOSPHORUS
PHOSPHORUS: 1.7 mg/dL — AB (ref 2.5–4.6)
PHOSPHORUS: 1.2 mg/dL — AB (ref 2.5–4.6)

## 2016-05-05 LAB — BASIC METABOLIC PANEL
Anion gap: 6 (ref 5–15)
BUN: 32 mg/dL — ABNORMAL HIGH (ref 6–20)
CHLORIDE: 95 mmol/L — AB (ref 101–111)
CO2: 40 mmol/L — AB (ref 22–32)
CREATININE: 0.34 mg/dL — AB (ref 0.44–1.00)
Calcium: 10 mg/dL (ref 8.9–10.3)
GFR calc non Af Amer: >60 mL/min (ref >60)
Glucose, Bld: 131 mg/dL — ABNORMAL HIGH (ref 65–99)
Potassium: 3.6 mmol/L (ref 3.5–5.1)
Sodium: 141 mmol/L (ref 135–145)

## 2016-05-05 LAB — GLUCOSE, CAPILLARY
GLUCOSE-CAPILLARY: 118 mg/dL — AB (ref 65–99)
GLUCOSE-CAPILLARY: 128 mg/dL — AB (ref 65–99)

## 2016-05-05 MED ORDER — MIDAZOLAM HCL 2 MG/2ML IJ SOLN
1.0000 mg | INTRAMUSCULAR | Status: AC | PRN
  Administered 2016-05-11 – 2016-05-14 (×2): 1 mg via INTRAVENOUS
  Administered 2016-05-15: 16:00:00 via INTRAVENOUS
  Filled 2016-05-05 (×3): qty 2

## 2016-05-05 MED ORDER — VITAL HIGH PROTEIN PO LIQD: 1000.0000 mL | ORAL | Status: DC

## 2016-05-05 MED ORDER — FENTANYL CITRATE (PF) 100 MCG/2ML IJ SOLN
50.0000 ug | INTRAMUSCULAR | Status: DC | PRN
  Administered 2016-05-05: 100 ug via INTRAVENOUS

## 2016-05-05 MED ORDER — SODIUM CHLORIDE 0.9 % IV SOLN
30.0000 meq | Freq: Once | INTRAVENOUS | Status: AC
  Administered 2016-05-05: 30 meq via INTRAVENOUS
  Filled 2016-05-05: qty 15

## 2016-05-05 MED ORDER — MIDAZOLAM HCL 2 MG/2ML IJ SOLN
INTRAMUSCULAR | Status: AC
  Administered 2016-05-05: 2 mg via INTRAVENOUS
  Filled 2016-05-05: qty 4

## 2016-05-05 MED ORDER — PRO-STAT SUGAR FREE PO LIQD
30.0000 mL | Freq: Two times a day (BID) | ORAL | Status: DC
  Filled 2016-05-05: qty 30

## 2016-05-05 MED ORDER — FENTANYL CITRATE (PF) 100 MCG/2ML IJ SOLN
INTRAMUSCULAR | Status: AC
  Administered 2016-05-05: 100 ug via INTRAVENOUS
  Filled 2016-05-05: qty 4

## 2016-05-05 MED ORDER — FENTANYL 2500MCG IN NS 250ML (10MCG/ML) PREMIX INFUSION
25.0000 ug/h | INTRAVENOUS | Status: DC
  Administered 2016-05-05 – 2016-05-06 (×2): 50 ug/h via INTRAVENOUS
  Administered 2016-05-08: 75 ug/h via INTRAVENOUS
  Administered 2016-05-09 (×2): 50 ug/h via INTRAVENOUS
  Filled 2016-05-05 (×4): qty 250

## 2016-05-05 MED ORDER — FENTANYL CITRATE (PF) 100 MCG/2ML IJ SOLN: 50.0000 ug | INTRAMUSCULAR | Status: DC | PRN

## 2016-05-05 MED ORDER — POTASSIUM CHLORIDE CRYS ER 20 MEQ PO TBCR: 40.0000 meq | EXTENDED_RELEASE_TABLET | Freq: Once | ORAL | Status: DC

## 2016-05-05 MED ORDER — MIDAZOLAM HCL 2 MG/2ML IJ SOLN
1.0000 mg | INTRAMUSCULAR | Status: DC | PRN
  Administered 2016-05-05: 2 mg via INTRAVENOUS
  Administered 2016-05-11 – 2016-05-16 (×12): 1 mg via INTRAVENOUS
  Filled 2016-05-05 (×10): qty 2

## 2016-05-05 MED ORDER — CHLORHEXIDINE GLUCONATE 0.12% ORAL RINSE (MEDLINE KIT): 15.0000 mL | Freq: Two times a day (BID) | OROMUCOSAL | Status: DC

## 2016-05-05 MED ORDER — VITAL AF 1.2 CAL PO LIQD
1000.0000 mL | ORAL | Status: DC
  Administered 2016-05-05 – 2016-05-10 (×4): 1000 mL
  Filled 2016-05-05: qty 1000

## 2016-05-05 MED ORDER — ORAL CARE MOUTH RINSE
15.0000 mL | Freq: Four times a day (QID) | OROMUCOSAL | Status: DC
  Administered 2016-05-06 – 2016-05-10 (×19): 15 mL via OROMUCOSAL

## 2016-05-05 NOTE — Progress Notes (Signed)
Name: Tara Cruz MRN: [id number] DOB: 1950/11/27    ADMISSION DATE:  05/04/2016   CHIEF COMPLAINT:  Shortness of Breath  BRIEF PATIENT DESCRIPTION:  65 year old female w/ prior C4-C7 ACDF back in June 2017. 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 Since this time has had progressive ascending weakness, weight loss and recurrent acute on chronic hypercarbia. She was actually referred to Dr Christene LyeKarikari ([facility] Dr Shon BatonBrooks but did not get to the appointment). She was admitted to OSH on 12/4 w/ cc: dyspnea, then later [facility] for progressive hypercarbic respiratory failure.   SIGNIFICANT EVENTS  [facility]5 [facility]  STUDIES:  10/21/15 EMG/NCS [Dr. 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 Considerable artifact from the fusion hardware. 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 Abnormal T2 signal in the cord immediately distal to that consistent with gliosis. - Facet arthropathy at C2-3 and C3-4 with neural foraminal stenosis that could be symptomatic. 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 Canal diameter 4 mm. Increasingly prominent 5 x 3 x 15 mm T2 hyperintense fluid collection dorsal to but within the surgical construct, uncertain significance.   SUBJECTIVE:  Generalized weakness resting comfortably on BiPAP  VITAL SIGNS: Temp:  [96.9 F (36.1 C)-98.1 F (36.7 C)] 96.9  F (36.1 C) (12/06 0747) Pulse Rate:  [60-89] 64 (12/06 0740) Resp:  [12-29] 13 (12/06 0740) BP: (112-156)/(63-93) 143/63 (12/06 0740) SpO2:  [92 %-99 %] 98 % (12/06 0740) FiO2 (%):  [30 %] 30 % (12/06 0740) Weight:  [108 lb 7.5 oz (49.2 kg)] 108 lb 7.5 oz (49.2 kg) (12/05 1205)  PHYSICAL EXAMINATION: General:  Ill appearing female, difficulty to speaking, labored breathing Neuro:  A/O x 4, able to follow commands 1/5 HEENT:  Normocephalic no drainage from mouth, nose or ears. BiPAP mask in place. Cardiovascular:  S1S2 no MGR Lungs:  Labored, clear to ascultation.    Abdomen:  Soft non-distended Musculoskeletal:  generalized weakness, bilateral foot drop Skin:  Warm and dry, intact ABG    Component Value Date/Time   PHART 7.391 05/05/2016 0401   PCO2ART 71.8 (HH) 05/05/2016 0401   PO2ART 80.7 (L) 05/05/2016 0401   HCO3 42.6 (H) 05/05/2016 0401   TCO2 47 05/04/2016 1214   O2SAT 96.1 05/05/2016 0401    Recent Labs Lab 05/04/16 1430 05/05/16 0149  NA 142 141  K 4.4 3.6  CL 97* 95*  CO2 43* 40*  BUN 29* 32*  CREATININE 0.41* 0.34*  GLUCOSE 144* 131*    Recent Labs Lab 05/04/16 1511 05/05/16 0149  HGB 15.3* 14.6  HCT 49.3* 48.0*  WBC 6.2 8.2  PLT 262 273   CBG (last 3)   Recent Labs  05/04/16 1214  GLUCAP 133*    Dg Chest Port 1 View  Result Date: 05/04/2016 CLINICAL DATA:  Worsening respiratory failure EXAM: PORTABLE CHEST 1 VIEW COMPARISON:  04/10/2016 FINDINGS: Cardiac shadow is within normal limits. The lungs are well  aerated bilaterally. No focal infiltrate or sizable effusion is seen. Postsurgical changes are noted in the cervical spine. IMPRESSION: No acute intrathoracic abnormality noted. Electronically Signed   By: Alcide CleverMark  Lukens M.D.   On: 05/04/2016 14:25    ASSESSMENT / PLAN:  Pulmonary A: Acute on chronic hypercarbic respiratory failure in setting of neuro-muscular weakness.  P: BiPAP Likely elective intubation later today to facilitate  supportive care  Continuous SpO2 ABG in AM  Cardiology A: Hypertension, Hyperlipidemia  CAD  P: Continuous telemetry SCDs Strict I&O  Neurological A: Encephalopathy in setting of hypercarbic respiratory failure Progressive neuro-muscular weakness. 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  P: Supportive care Avoid sedating drugs unless airway protected at which time she would be intubated for safe transport.  will defer from further neurological testing as there is no need to duplicate what will likely all be [facility]  GI: A: Protein calorie malnutrition P: Start tubefeeds PPI for SUP  Renal: A: No acute P: Trend intermittent lytes Am PO4 and mg  HEME A: No acute P: Rush heparin   Endocrine  A: Mild hyperglycemia  P: ssi   Musculoskeletal A: Foot drop   P: Podus boot (rotate q shift)  Discussion: 65 year old female w/ prior C4-C7 ACDF back in June 2017. 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 Since this time has had progressive ascending weakness, weight loss and recurrent acute on chronic hypercarbia. She was actually referred to Dr Christene LyeKarikari [facility] Dr Shon BatonBrooks but did not get to the appointment). She was admitted to OSH on 12/4 w/ cc: dyspnea, then later [facility] for progressive hypercarbic respiratory failure. [facility]. If indeed a surgical intervention is indicated then she would be best [facility]. If it is felt that her C-spine compression does not explain her progressive weakness then transfer to an academic institute would still be best to facilitate EMG/NCS and further neurological testing.  [facility], however at this time beds are not available in the ICU.  Will  continue to support patient until bed becomes available.  Likely intubate later today   My critical care time 20 minutes   Simonne MartinetPeter E Juliene Kirsh ACNP-BC [facility] Pulmonary/Critical Care Pager # [phone] OR # [phone] if no answer  05/05/2016, 9:32 AM

## 2016-05-05 NOTE — Procedures (Signed)
Intubation Procedure Note Tara Cruz [id number] 1950/06/22  Procedure: Intubation Indications: Respiratory insufficiency  Procedure Details Consent: Risks of procedure as well as the alternatives and risks of each were explained to the (patient/caregiver).  Consent for procedure obtained. Time Out: Verified patient identification, verified procedure, site/side was marked, verified correct patient position, special equipment/implants available, medications/allergies/relevent history reviewed, required imaging and test results available.  Performed  Maximum sterile technique was used including antiseptics, gloves, hand hygiene and mask.  MAC and 3    Evaluation Hemodynamic Status: BP stable throughout; O2 sats: stable throughout Patient's Current Condition: stable Complications: No apparent complications Patient did tolerate procedure well. Chest X-ray ordered to verify placement.  CXR: pending.   Shelby Mattocksete E Buffy Ehler 05/05/2016 Simonne MartinetPeter E Zaheer Wageman ACNP-BC [facility] Pulmonary/Critical Care Pager # [phone] OR # [phone] if no answer

## 2016-05-05 NOTE — Progress Notes (Signed)
CRITICAL VALUE ALERT  Critical value received:  ABG CO2  Date of notification:  16/6/17  Time of notification:  0415  Critical value read back:Yes.    Nurse who received alert:  Judeth CornfieldStephanie, RN  MD notified (1st page):  Sommers  Time of first page:  0627  MD notified (2nd page):  Time of second page:  Responding MD:  Dellie CatholicSommers  Time MD responded:  [phone]

## 2016-05-05 NOTE — Progress Notes (Signed)
Initial Nutrition Assessment  DOCUMENTATION CODES:   Severe malnutrition in context of chronic illness, Underweight  INTERVENTION:    After intubation and enteral feeding tube placement, initiate TF via OG/NG tube with Vital AF 1.2 at goal rate of 45 ml/h (1080 ml per day) to provide 1296 kcals, 81 gm protein, 876 ml free water daily.  NUTRITION DIAGNOSIS:   Malnutrition related to chronic illness as evidenced by severe depletion of muscle mass, percent weight loss (29% weight loss within the past 6 months).  GOAL:   Patient will meet greater than or equal to 90% of their needs  MONITOR:   Vent status, TF tolerance, Labs, I & O's, Skin  REASON FOR ASSESSMENT:   Consult, Malnutrition Screening Tool Enteral/tube feeding initiation and management  ASSESSMENT:   65 [facility] hospital with dyspnea and progressive hypercarbic respiratory failure. Hx of C4-C7 ACDF back surgery in June 2017. Has had progressive weakness and weight loss since back surgery.  Discussed patient with RN today. [facility] Plans for elective intubation today. Received MD Consult for TF initiation and management. Enteral access to be placed during/after intubation. Spoke with patient who reports severe weight loss with decreased intake for the past 6 months. Nutrition-Focused physical exam completed. Findings are mild-moderate fat depletion, severe muscle depletion, and moderate edema.  Patient with severe PCM. Labs reviewed: potassium 3.6, trending down. Medications reviewed and include KCl.  Diet Order:  Diet NPO time specified  Skin:  Wound (see comment) (stage 1 pressure injuries to bilateral heels)  Last BM:  unknown  Height:   Ht Readings from Last 1 Encounters:  05/04/16 5\' 6"  (1.676 m)    Weight:   Wt Readings from Last 1 Encounters:  05/04/16 108 lb  7.5 oz (49.2 kg)    Ideal Body Weight:  59.1 kg  BMI:  Body mass index is 17.51 kg/m.  Estimated Nutritional Needs:   Kcal:  1250-1450 while on vent; 1600-1800 when not on vent  Protein:  80-90 gm  Fluid:  >/= 1.6 L  EDUCATION NEEDS:   No education needs identified at this time  Joaquin CourtsKimberly Harris, RD, LDN, CNSC Pager [phone] After Hours Pager [phone]

## 2016-05-05 NOTE — Care Management Note (Signed)
Case Management Note  Patient Details  Name: Tara Cruz MRN: [id number] Date of Birth: [birth year]  Subjective/Objective:   Pt admitted with acute on chronic resp failure - per PA pt will be intubated today - pt is now in distress on BIPAP                 Action/Plan:  PTA from home - onging worsening neuro-muscular weakness due to cervical spinal cord compression. Attending has arranged for pt to be transferred to [facility]:  she has been accepted to the Neuro-critical care Dr Sherryll BurgerShah. Currently no beds so we will cont supportive care in the mean time.  CM will continue to monitor for discharge needs ;    Expected Discharge Date:                  Expected Discharge Plan:  Acute to Acute Transfer  In-House Referral:     Discharge planning Services  CM Consult  Post Acute Care Choice:    Choice offered to:     DME Arranged:    DME Agency:     HH Arranged:    HH Agency:     Status of Service:  In process, will continue to follow  If discussed at Long Length of Stay Meetings, dates discussed:    Additional Comments:  Tara Cruz, Tara Rampy S, RN 05/05/2016, 10:36 AM

## 2016-05-06 LAB — COMPREHENSIVE METABOLIC PANEL
ALT: 22 U/L (ref 14–54)
AST: 21 U/L (ref 15–41)
Albumin: 2.4 g/dL — ABNORMAL LOW (ref 3.5–5.0)
Alkaline Phosphatase: 46 U/L (ref 38–126)
Anion gap: 5 (ref 5–15)
BUN: 38 mg/dL — ABNORMAL HIGH (ref 6–20)
CO2: 36 mmol/L — ABNORMAL HIGH (ref 22–32)
Calcium: 9.1 mg/dL (ref 8.9–10.3)
Chloride: 100 mmol/L — ABNORMAL LOW (ref 101–111)
Creatinine, Ser: 0.3 mg/dL — ABNORMAL LOW (ref 0.44–1.00)
GFR calc Af Amer: >60 mL/min (ref >60)
GFR calc non Af Amer: >60 mL/min (ref >60)
Glucose, Bld: 159 mg/dL — ABNORMAL HIGH (ref 65–99)
Potassium: 3.7 mmol/L (ref 3.5–5.1)
Sodium: 141 mmol/L (ref 135–145)
Total Bilirubin: 0.6 mg/dL (ref 0.3–1.2)
Total Protein: 4.2 g/dL — ABNORMAL LOW (ref 6.5–8.1)

## 2016-05-06 LAB — GLUCOSE, CAPILLARY
GLUCOSE-CAPILLARY: 129 mg/dL — AB (ref 65–99)
GLUCOSE-CAPILLARY: 135 mg/dL — AB (ref 65–99)
Glucose-Capillary: 145 mg/dL — ABNORMAL HIGH (ref 65–99)
Glucose-Capillary: 146 mg/dL — ABNORMAL HIGH (ref 65–99)
Glucose-Capillary: 127 mg/dL — ABNORMAL HIGH (ref 65–99)
Glucose-Capillary: 116 mg/dL — ABNORMAL HIGH (ref 65–99)

## 2016-05-06 LAB — URINE CULTURE

## 2016-05-06 LAB — BLOOD GAS, ARTERIAL
Acid-Base Excess: 11.8 mmol/L — ABNORMAL HIGH (ref 0.0–2.0)
BICARBONATE: 36.8 mmol/L — AB (ref 20.0–28.0)
Drawn by: 31101
FIO2: 0.4
LHR: 15 {breaths}/min
O2 Saturation: 98.2 %
PATIENT TEMPERATURE: 98.6
PCO2 ART: 56.9 mmHg — AB (ref 32.0–48.0)
PEEP: 5 cmH2O
VT: 400 mL
pH, Arterial: 7.426 (ref 7.350–7.450)
pO2, Arterial: 112 mmHg — ABNORMAL HIGH (ref 83.0–108.0)

## 2016-05-06 LAB — CBC
HEMATOCRIT: 40.4 % (ref 36.0–46.0)
HEMOGLOBIN: 12.7 g/dL (ref 12.0–15.0)
MCH: 30.9 pg (ref 26.0–34.0)
MCHC: 31.4 g/dL (ref 30.0–36.0)
MCV: 98.3 fL (ref 78.0–100.0)
Platelets: 230 10*3/uL (ref 150–400)
RBC: 4.11 MIL/uL (ref 3.87–5.11)
RDW: 12.7 % (ref 11.5–15.5)
WBC: 11 10*3/uL — ABNORMAL HIGH (ref 4.0–10.5)

## 2016-05-06 LAB — MAGNESIUM
Magnesium: 1.8 mg/dL (ref 1.7–2.4)
Magnesium: 1.8 mg/dL (ref 1.7–2.4)

## 2016-05-06 LAB — PHOSPHORUS
Phosphorus: 1.5 mg/dL — ABNORMAL LOW (ref 2.5–4.6)
Phosphorus: 4.1 mg/dL (ref 2.5–4.6)

## 2016-05-06 MED ORDER — SODIUM PHOSPHATES 45 MMOLE/15ML IV SOLN
30.0000 mmol | Freq: Once | INTRAVENOUS | Status: AC
  Administered 2016-05-06: 30 mmol via INTRAVENOUS
  Filled 2016-05-06: qty 10

## 2016-05-06 MED ORDER — CEPHALEXIN 250 MG PO CAPS
250.0000 mg | ORAL_CAPSULE | Freq: Four times a day (QID) | ORAL | Status: DC
  Administered 2016-05-06 – 2016-05-11 (×20): 250 mg
  Filled 2016-05-06 (×21): qty 1

## 2016-05-06 MED ORDER — FREE WATER
200.0000 mL | Freq: Three times a day (TID) | Status: DC
  Administered 2016-05-06 – 2016-05-12 (×18): 200 mL

## 2016-05-06 MED ORDER — FAMOTIDINE 40 MG/5ML PO SUSR: 20.0000 mg | Freq: Every day | ORAL | Status: DC

## 2016-05-06 MED ORDER — CEPHALEXIN 250 MG/5ML PO SUSR: 250.0000 mg | Freq: Four times a day (QID) | ORAL | Status: DC

## 2016-05-06 MED ORDER — FAMOTIDINE 40 MG/5ML PO SUSR
20.0000 mg | Freq: Two times a day (BID) | ORAL | Status: DC
  Administered 2016-05-06 – 2016-05-13 (×15): 20 mg
  Filled 2016-05-06 (×15): qty 2.5

## 2016-05-06 NOTE — Progress Notes (Signed)
eLink Physician-Brief Progress Note Patient Name: Ledora Bottcheratricia A Bias DOB: 1950/11/18 MRN: [id number]   Date of Service  05/06/2016  HPI/Events of Note  Urine pos for e colin > 100k resistant to amp   eICU Interventions  Keflex per ft     Intervention Category Major Interventions: Infection - evaluation and management  Sandrea HughsMichael Derrious Bologna 05/06/2016, 3:51 PM

## 2016-05-06 NOTE — Progress Notes (Signed)
Name: Tara Cruz MRN: [id number] DOB: 08/24/1950    ADMISSION DATE:  05/04/2016   CHIEF COMPLAINT:  Shortness of Breath  SIGNIFICANT EVENTS  [facility]; [facility]  12/6 electively intubated. Tube feeds started.   STUDIES:  10/21/15 EMG/NCS [Dr. 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 Considerable artifact from the fusion hardware. 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 Abnormal T2 signal in the cord immediately distal to that consistent with gliosis. - Facet arthropathy at C2-3 and C3-4 with neural foraminal stenosis that could be symptomatic. 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 Canal diameter 4 mm. Increasingly prominent 5 x 3 x 15 mm T2 hyperintense fluid collection dorsal to but within the surgical construct, uncertain significance.   SUBJECTIVE: sedated on vent  VITAL SIGNS: Temp:  [96.2 F (35.7 C)-98.6 F (37 C)] 98.6 F (37 C) (12/07 0824) Pulse Rate:  [56-93] 75 (12/07 0800) Resp:  [11-19] 15 (12/07 0800) BP: (75-181)/(53-117) 112/66 (12/07 0800) SpO2:  [91 %-100 %] 91 % (12/07 0800) FiO2 (%):  [30 %-100 %] 40 % (12/07 0800) Weight:  [108 lb 7.5 oz (49.2 kg)-117 lb 8.1 oz (53.3 kg)] 117 lb 8.1 oz (53.3 kg) (12/07 0425)  PHYSICAL EXAMINATION: General:  Ill appearing female, sedated on vent Neuro:  A/O x 4, able to follow commands 1/5 HEENT:  Normocephalic no drainage from mouth, nose or ears. Orally intubated Cardiovascular:  S1S2 no  MGR Lungs:  Labored, clear to ascultation.    Abdomen:  Soft non-distended Musculoskeletal:  generalized weakness, bilateral foot drop Skin:  Warm and dry, intact ABG    Component Value Date/Time   PHART 7.426 05/06/2016 0350   PCO2ART 56.9 (H) 05/06/2016 0350   PO2ART 112 (H) 05/06/2016 0350   HCO3 36.8 (H) 05/06/2016 0350   TCO2 47 05/04/2016 1214   O2SAT 98.2 05/06/2016 0350    Recent Labs Lab 05/04/16 1430 05/05/16 0149 05/06/16 0528  NA 142 141 141  K 4.4 3.6 3.7  CL 97* 95* 100*  CO2 43* 40* 36*  BUN 29* 32* 38*  CREATININE 0.41* 0.34* 0.30*  GLUCOSE 144* 131* 159*    Recent Labs Lab 05/04/16 1511 05/05/16 0149 05/06/16 0528  HGB 15.3* 14.6 12.7  HCT 49.3* 48.0* 40.4  WBC 6.2 8.2 11.0*  PLT 262 273 230   CBG (last 3)   Recent Labs  05/05/16 2332 05/06/16 0327 05/06/16 0822  GLUCAP 128* 145* 146*    Portable Chest Xray  Result Date: 05/05/2016 CLINICAL DATA:  Acute on chronic respiratory failure EXAM: PORTABLE CHEST 1 VIEW COMPARISON:  05/04/2016 FINDINGS: Endotracheal tube is been placed in good position. Lungs remain clear without infiltrate or effusion or heart failure. IMPRESSION: Endotracheal tube in good position.  Lungs remain clear Electronically Signed   By: Marlan Palauharles  Clark M.D.   On: 05/05/2016 15:26   Dg Chest Port 1 View  Result Date: 05/04/2016 CLINICAL DATA:  Worsening respiratory failure EXAM: PORTABLE CHEST 1 VIEW COMPARISON:  04/10/2016 FINDINGS: Cardiac shadow is within normal limits. The lungs  are well aerated bilaterally. No focal infiltrate or sizable effusion is seen. Postsurgical changes are noted in the cervical spine. IMPRESSION: No acute intrathoracic abnormality noted. Electronically Signed   By: Alcide CleverMark  Lukens M.D.   On: 05/04/2016 14:25    ASSESSMENT / PLAN:  Pulmonary A: Acute on chronic hypercarbic respiratory failure in setting of neuro-muscular weakness.  P: Full vent support VAP protocol  Intermittent abg/PCXR PAD  protocol   Cardiology A: Hypertension, Hyperlipidemia  CAD  P: Continuous telemetry SCDs Strict I&O  Neurological A: Encephalopathy in setting of hypercarbic respiratory failure Progressive neuro-muscular weakness. 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  P: Supportive care Avoid sedating drugs unless airway protected at which time she would be intubated for safe transport.  will defer from further neurological testing as there is no need to duplicate what [facility]  GI: A: Protein calorie malnutrition P: Started tubefeeds PPI for SUP  Renal: A: Hypophosphatemia  P: Trend intermittent lytes Am PO4 and mg  HEME A: No acute P: [place] heparin   Endocrine  A: Mild hyperglycemia  P: ssi   Musculoskeletal A: Foot drop   P: Podus boot (rotate q shift)  Discussion: 65 year old female w/ prior C4-C7 ACDF back in June 2017. 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 Since this time has had progressive ascending weakness, weight loss and recurrent acute on chronic hypercarbia. She was actually referred to Dr Christene LyeKarikari [facility] Dr Shon BatonBrooks but did not get to the appointment). Intubated yesterday. Awaiting bed at [facility]  My critical care time 20 minutes   Simonne MartinetPeter E Tamirra Sienkiewicz ACNP-BC [facility]Critical [facility] Pager # [phone] OR # [phone] if no answer  05/06/2016, 8:56 AM

## 2016-05-07 DIAGNOSIS — B9620 Unspecified Escherichia coli [E. coli] as the cause of diseases classified elsewhere: Secondary | ICD-10-CM

## 2016-05-07 DIAGNOSIS — N390 Urinary tract infection, site not specified: Secondary | ICD-10-CM

## 2016-05-07 LAB — GLUCOSE, CAPILLARY
GLUCOSE-CAPILLARY: 127 mg/dL — AB (ref 65–99)
GLUCOSE-CAPILLARY: 132 mg/dL — AB (ref 65–99)
GLUCOSE-CAPILLARY: 118 mg/dL — AB (ref 65–99)
GLUCOSE-CAPILLARY: 128 mg/dL — AB (ref 65–99)
Glucose-Capillary: 111 mg/dL — ABNORMAL HIGH (ref 65–99)
Glucose-Capillary: 126 mg/dL — ABNORMAL HIGH (ref 65–99)

## 2016-05-07 MED ORDER — SENNOSIDES 8.8 MG/5ML PO SYRP
5.0000 mL | ORAL_SOLUTION | Freq: Two times a day (BID) | ORAL | Status: DC
  Administered 2016-05-07 – 2016-05-16 (×19): 5 mL
  Filled 2016-05-07 (×19): qty 5

## 2016-05-07 MED ORDER — DOCUSATE SODIUM 50 MG/5ML PO LIQD
100.0000 mg | Freq: Two times a day (BID) | ORAL | Status: DC
  Administered 2016-05-07 – 2016-05-16 (×19): 100 mg
  Filled 2016-05-07 (×20): qty 10

## 2016-05-07 NOTE — Progress Notes (Signed)
Name: Tara Bottcheratricia A Masterson MRN: [id number] DOB: 07/04/1950    ADMISSION DATE:  05/04/2016   CHIEF COMPLAINT:  Shortness of Breath  SIGNIFICANT EVENTS  [facility].  12/6 electively intubated. Tube feeds started.   STUDIES:  10/21/15 EMG/NCS [Dr. 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 Considerable artifact from the fusion hardware. 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 Abnormal T2 signal in the cord immediately distal to that consistent with gliosis. - Facet arthropathy at C2-3 and C3-4 with neural foraminal stenosis that could be symptomatic. 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 Canal diameter 4 mm. Increasingly prominent 5 x 3 x 15 mm T2 hyperintense fluid collection dorsal to but within the surgical construct, uncertain significance.   SUBJECTIVE: sedated on vent  VITAL SIGNS: Temp:  [97.6 F (36.4 C)-99.1 F (37.3 C)] 98.6 F (37 C) (12/08 0805) Pulse Rate:  [66-84] 71 (12/08 1000) Resp:  [15-22] 15 (12/08 1000) BP: (89-121)/(51-62) 107/52 (12/08 1000) SpO2:  [95 %-100 %] 96 % (12/08 1000) FiO2 (%):  [40 %] 40 % (12/08 1000) Weight:  [121 lb 11.1 oz (55.2 kg)] 121 lb 11.1 oz (55.2 kg) (12/08 0500)  PHYSICAL EXAMINATION: General:  Ill appearing female, sedated on vent Neuro:  A/O x 4, able to follow commands 1/5 HEENT:  Normocephalic no drainage from mouth, nose or ears. Orally intubated Cardiovascular:  S1S2 no MGR Lungs:  Labored, clear  to ascultation. No accessory use  Abdomen:  Soft non-distended Musculoskeletal:  generalized weakness, bilateral foot drop Skin:  Warm and dry, intact ABG    Component Value Date/Time   PHART 7.426 05/06/2016 0350   PCO2ART 56.9 (H) 05/06/2016 0350   PO2ART 112 (H) 05/06/2016 0350   HCO3 36.8 (H) 05/06/2016 0350   TCO2 47 05/04/2016 1214   O2SAT 98.2 05/06/2016 0350    Recent Labs Lab 05/04/16 1430 05/05/16 0149 05/06/16 0528  NA 142 141 141  K 4.4 3.6 3.7  CL 97* 95* 100*  CO2 43* 40* 36*  BUN 29* 32* 38*  CREATININE 0.41* 0.34* 0.30*  GLUCOSE 144* 131* 159*    Recent Labs Lab 05/04/16 1511 05/05/16 0149 05/06/16 0528  HGB 15.3* 14.6 12.7  HCT 49.3* 48.0* 40.4  WBC 6.2 8.2 11.0*  PLT 262 273 230   CBG (last 3)   Recent Labs  05/06/16 2333 05/07/16 0342 05/07/16 0803  GLUCAP 129* 127* 132*    Portable Chest Xray  Result Date: 05/05/2016 CLINICAL DATA:  Acute on chronic respiratory failure EXAM: PORTABLE CHEST 1 VIEW COMPARISON:  05/04/2016 FINDINGS: Endotracheal tube is been placed in good position. Lungs remain clear without infiltrate or effusion or heart failure. IMPRESSION: Endotracheal tube in good position.  Lungs remain clear Electronically Signed   By: Marlan Palauharles  Clark M.D.   On: 05/05/2016 15:26    ASSESSMENT / PLAN:  Pulmonary A: Acute on chronic hypercarbic respiratory failure in setting of neuro-muscular weakness.  P: Full vent support VAP protocol  Intermittent abg/PCXR PAD protocol   Cardiology A: Hypertension, Hyperlipidemia  CAD  P: Continuous telemetry SCDs Strict I&O  Neurological A: Encephalopathy in setting of hypercarbic respiratory failure Progressive neuro-muscular weakness. 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  P: Supportive care Avoid sedating drugs unless airway protected at which time she would be intubated for safe transport.   will defer from further neurological testing as there is no need to duplicate what [facility]  GI: A: Protein calorie malnutrition P: Started tubefeeds PPI for SUP  Renal: A: No acute  P: Trend lytes/replace as needed   HEME A: No acute P: [place] heparin   Infectious disease  A: UTI (Ecoli) P: Keflex started 12/7>>  Endocrine  A: Mild hyperglycemia  P: ssi   Musculoskeletal A: Foot drop   P: Podus boot (rotate q shift)  Discussion: 65 year old female w/ prior C4-C7 ACDF back in June 2017. 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 Since this time has had progressive ascending weakness, weight loss and recurrent acute on chronic hypercarbia. She was actually referred to Dr Christene LyeKarikari [facility] Shon BatonBrooks but did not get to the appointment). Intubated 12/6, tube feedings started 12/6. Started Keflex 12/7 for ecoli UTI. [facility] Clelia [facility]ftShaw   My critical care time 20 minutes   Simonne MartinetPeter E Eban Weick ACNP-BC [facility] Pulmonary/Critical Care Pager # [phone] OR # [phone] if no answer  05/07/2016, 10:49 AM

## 2016-05-07 NOTE — Care Management Note (Signed)
Case Management Note  Patient Details  Name: Ledora Bottcheratricia A Carbine MRN: [id number] Date of Birth: 1950-07-02  Subjective/Objective:   Pt admitted with acute on chronic resp failure - per PA pt will be intubated today - pt is now in distress on BIPAP                 Action/Plan:  PTA from home - onging worsening neuro-muscular weakness due to cervical spinal cord compression. Attending has arranged for pt to be transferred to [facility]:  she has been accepted to the Neuro-critical care Dr Sherryll BurgerShah. Currently no beds so we will cont supportive care in the mean time.  CM will continue to monitor for discharge needs ;    Expected Discharge Date:                  Expected Discharge Plan:  Acute to Acute Transfer  In-House Referral:     Discharge planning Services  CM Consult  Post Acute Care Choice:    Choice offered to:     DME Arranged:    DME Agency:     HH Arranged:    HH Agency:     Status of Service:  In process, will continue to follow  If discussed at Long Length of Stay Meetings, dates discussed:    Additional Comments: 05/07/2016 [facility] per attending pt may get a bed today.  Pt remains intubated Cherylann ParrClaxton, Sae Handrich S, RN 05/07/2016, 3:01 PM

## 2016-05-07 NOTE — Care Management Important Message (Signed)
Important Message  Patient Details  Name: Tara Bottcheratricia A Rhee MRN: [id number] Date of Birth: 09-Sep-1950   Medicare Important Message Given:  Yes    Kyla BalzarineShealy, Wen Merced Abena 05/07/2016, 12:11 PM

## 2016-05-08 ENCOUNTER — Encounter (HOSPITAL_COMMUNITY): Payer: Self-pay

## 2016-05-08 DIAGNOSIS — J9620 Acute and chronic respiratory failure, unspecified whether with hypoxia or hypercapnia: Secondary | ICD-10-CM

## 2016-05-08 LAB — GLUCOSE, CAPILLARY
GLUCOSE-CAPILLARY: 130 mg/dL — AB (ref 65–99)
GLUCOSE-CAPILLARY: 120 mg/dL — AB (ref 65–99)
GLUCOSE-CAPILLARY: 119 mg/dL — AB (ref 65–99)
Glucose-Capillary: 128 mg/dL — ABNORMAL HIGH (ref 65–99)
Glucose-Capillary: 124 mg/dL — ABNORMAL HIGH (ref 65–99)

## 2016-05-08 LAB — BASIC METABOLIC PANEL
ANION GAP: 6 (ref 5–15)
BUN: 15 mg/dL (ref 6–20)
CO2: 36 mmol/L — AB (ref 22–32)
Calcium: 9.2 mg/dL (ref 8.9–10.3)
Chloride: 96 mmol/L — ABNORMAL LOW (ref 101–111)
Creatinine, Ser: <0.3 mg/dL — ABNORMAL LOW (ref 0.44–1.00)
GLUCOSE: 124 mg/dL — AB (ref 65–99)
POTASSIUM: 4 mmol/L (ref 3.5–5.1)
Sodium: 138 mmol/L (ref 135–145)

## 2016-05-08 NOTE — Progress Notes (Signed)
Name: Tara Bottcheratricia A Pompei MRN: [id number] DOB: [birth year]    ADMISSION DATE:  05/04/2016   CHIEF COMPLAINT:  Shortness of Breath  SIGNIFICANT EVENTS  [facility].  12/6 electively intubated. Tube feeds started.   STUDIES:  10/21/15 EMG/NCS [Dr. 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 Considerable artifact from the fusion hardware. 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 Abnormal T2 signal in the cord immediately distal to that consistent with gliosis. - Facet arthropathy at C2-3 and C3-4 with neural foraminal stenosis that could be symptomatic. 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 Canal diameter 4 mm. Increasingly prominent 5 x 3 x 15 mm T2 hyperintense fluid collection dorsal to but within the surgical construct, uncertain significance.   SUBJECTIVE: No acute events overnight.  REVIEW OF SYSTEMS:  Unable to obtain given intubation and sedation.  VITAL SIGNS: Temp:  [98 F (36.7 C)-98.7 F (37.1 C)] 98.4 F (36.9 C) (12/09 1229) Pulse Rate:  [64-87] 65 (12/09 1500) Resp:  [14-19] 15 (12/09 1500) BP: (91-136)/(52-65) 91/53 (12/09 1500) SpO2:  [90 %-100 %] 90 % (12/09 1500) FiO2 (%):  [30 %-40 %] 30 % (12/09 1147) Weight:  [55.8 kg (123 lb 0.3 oz)] 55.8 kg (123 lb 0.3 oz) (12/09 0408)  PHYSICAL EXAMINATION: General:  Eyes closed. Comfortable. No family at bedside.  Integument:  Warm & dry. No rash on exposed skin.  HEENT:  Endotracheal tube in  place. No scleral injection or icterus. Cardiovascular:  Regular rate. No edema. No appreciable JVD.  Pulmonary:  Overall clear on auscultation. Symmetric chest rise on ventilator. Abdomen: Soft. Normal bowel sounds. Nondistended. Nontender. Neurological: Continues to have lower extremity weakness. Nods to questions appropriately. Attends to voice.  ABG    Component Value Date/Time   PHART 7.426 05/06/2016 0350   PCO2ART 56.9 (H) 05/06/2016 0350   PO2ART 112 (H) 05/06/2016 0350   HCO3 36.8 (H) 05/06/2016 0350   TCO2 47 05/04/2016 1214   O2SAT 98.2 05/06/2016 0350    Recent Labs Lab 05/05/16 0149 05/06/16 0528 05/08/16 0158  NA 141 141 138  K 3.6 3.7 4.0  CL 95* 100* 96*  CO2 40* 36* 36*  BUN 32* 38* 15  CREATININE 0.34* 0.30* <0.30*  GLUCOSE 131* 159* 124*    Recent Labs Lab 05/04/16 1511 05/05/16 0149 05/06/16 0528  HGB 15.3* 14.6 12.7  HCT 49.3* 48.0* 40.4  WBC 6.2 8.2 11.0*  PLT 262 273 230   CBG (last 3)   Recent Labs  05/08/16 0338 05/08/16 0842 05/08/16 1236  GLUCAP 128* 130* 120*    No results found.  ASSESSMENT / PLAN:  Pulmonary A: Acute on chronic hypercarbic respiratory failure in setting of neuro-muscular weakness.  P: Full vent support VAP protocol  Intermittent abg/PCXR PAD protocol   Cardiology A: Hypertension, Hyperlipidemia  CAD  P: Continuous telemetry SCDs Strict I&O  Neurological A: Encephalopathy in setting of hypercarbic respiratory failure Progressive neuro-muscular weakness. 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  P: Supportive care Avoid sedating drugs unless airway protected at which time she would be intubated for safe transport.  will defer from further neurological testing as there is no need to duplicate what [facility]  GI: A: Protein calorie malnutrition P: Cont tube feeds PPI for  SUP  Renal: A: No acute  P: Trend lytes/replace as needed   HEME A: No acute P: [place] heparin   Infectious disease  A: UTI (Ecoli) P: Keflex started 12/7>>  Endocrine  A: Mild hyperglycemia  P: ssi   Musculoskeletal A: Foot drop   P: Podus boot (rotate q shift)  TODAY'S SUMMARY: 65 year old female w/ prior C4-C7 ACDF back in June 2017. 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 Since this time has had progressive ascending weakness, weight loss and recurrent acute on chronic hypercarbia. She was actually referred to Dr Christene LyeKarikari [facility] Dr Shon BatonBrooks but did not get to the appointment). Intubated 12/6, tube feedings started 12/6. Started Keflex 12/7 for ecoli UTI. [facility] Clelia CroftShaw and awaiting bed.  I have spent a total of 32 minutes of critical care time today caring for the patient and reviewed the patient's electronic medical record.  Donna ChristenJennings E. Jamison NeighborNestor, M.D. [facility] Pulmonary & Critical [facility] Pager:  [phone] After 3pm or if no response, call [phone] 7:25 PM 05/08/2016

## 2016-05-09 LAB — GLUCOSE, CAPILLARY
GLUCOSE-CAPILLARY: 120 mg/dL — AB (ref 65–99)
GLUCOSE-CAPILLARY: 126 mg/dL — AB (ref 65–99)
GLUCOSE-CAPILLARY: 177 mg/dL — AB (ref 65–99)
Glucose-Capillary: 139 mg/dL — ABNORMAL HIGH (ref 65–99)
Glucose-Capillary: 121 mg/dL — ABNORMAL HIGH (ref 65–99)
Glucose-Capillary: 121 mg/dL — ABNORMAL HIGH (ref 65–99)

## 2016-05-09 MED ORDER — BUDESONIDE 0.5 MG/2ML IN SUSP
0.5000 mg | Freq: Two times a day (BID) | RESPIRATORY_TRACT | Status: DC
  Administered 2016-05-09 – 2016-05-11 (×4): 0.5 mg via RESPIRATORY_TRACT
  Filled 2016-05-09 (×4): qty 2

## 2016-05-09 MED ORDER — IPRATROPIUM-ALBUTEROL 0.5-2.5 (3) MG/3ML IN SOLN
3.0000 mL | Freq: Four times a day (QID) | RESPIRATORY_TRACT | Status: DC
Start: 2016-05-09 — End: 2016-05-11
  Administered 2016-05-09 – 2016-05-11 (×9): 3 mL via RESPIRATORY_TRACT
  Filled 2016-05-09 (×9): qty 3

## 2016-05-09 NOTE — Progress Notes (Signed)
Name: Tara Bottcheratricia A Cruz MRN: [id number] DOB: 06-11-1950    ADMISSION DATE:  05/04/2016   CHIEF COMPLAINT:  Shortness of Breath  Brief Hx: 65 year old female w/ prior C4-C7 ACDF back in June 2017. 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 Since this time has had progressive ascending weakness, weight loss and recurrent acute on chronic hypercarbia. She was actually referred to Dr Christene LyeKarikari [facility] Dr Shon BatonBrooks but did not get to the appointment). Intubated 12/6, tube feedings started 12/6. Started Keflex 12/7 for ecoli UTI. [facility] CroftShaw and awaiting bed.  SIGNIFICANT EVENTS  [facility]  12/6 electively intubated. Tube feeds started.   STUDIES:  10/21/15 EMG/NCS [Dr. 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 Considerable artifact from the fusion hardware. 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 Abnormal T2 signal in the cord immediately distal to that consistent with gliosis. - Facet arthropathy at C2-3 and C3-4 with neural foraminal stenosis that could be symptomatic. 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 Canal diameter 4 mm. Increasingly prominent 5 x 3 x 15 mm T2 hyperintense fluid collection dorsal to but within the surgical construct, uncertain significance.   SUBJECTIVE:  Waiting for [facility] bed.  No sig change.  Agitated.  Tol PS wean  10/5 x 4 hours.  Got lethargic, hypoxic.   VITAL SIGNS: Temp:  [98.3 F (36.8 C)-99.5 F (37.5 C)] 98.5 F (36.9 C) (12/10 1210) Pulse Rate:  [65-88] 77 (12/10 0500) Resp:  [15-18] 16 (12/10 0500) BP: (90-168)/(50-77) 133/64 (12/10 0500) SpO2:  [89 %-100 %] 90 % (12/10 1112) FiO2 (%):  [30 %-50 %] 50 % (12/10 1112) Weight:  [55 kg (121 lb 4.1 oz)] 55 kg (121 lb 4.1 oz) (12/10 0500)  PHYSICAL EXAMINATION: General:  Eyes closed. Comfortable.  Integument:  Warm & dry. No rash on exposed skin.  HEENT:  Endotracheal tube in place. No scleral injection or icterus. Cardiovascular:  Regular rate. No edema. No appreciable JVD.  Pulmonary:  Overall clear on auscultation. Symmetric chest rise on ventilator. Abdomen: Soft. Normal bowel sounds. Nondistended. Nontender. Neurological: Continues to have lower extremity weakness. Nods to questions appropriately.  Attends to voice.    Recent Labs Lab 05/05/16 0149 05/06/16 0528 05/08/16 0158  NA 141 141 138  K 3.6 3.7 4.0  CL 95* 100* 96*  CO2 40* 36* 36*  BUN 32* 38* 15  CREATININE 0.34* 0.30* <0.30*  GLUCOSE 131* 159* 124*    Recent Labs Lab 05/04/16 1511 05/05/16 0149 05/06/16 0528  HGB 15.3* 14.6 12.7  HCT 49.3* 48.0* 40.4  WBC 6.2 8.2 11.0*  PLT 262 273 230   CBG (last 3)   Recent Labs  05/09/16 0354 05/09/16 0845 05/09/16 1212  GLUCAP 139* 120* 177*    No results found.  ASSESSMENT / PLAN:  Pulmonary A: Acute on chronic hypercarbic respiratory failure in setting of neuro-muscular weakness with underlying COPD P:  Full vent support VAP protocol  Intermittent abg/PCXR PAD protocol  Add BD's (spiriva, albuterol at home)  Cardiology A: Hypertension, Hyperlipidemia  CAD P: Continuous telemetry SCDs Strict I&O  Neurological A: Encephalopathy in setting of hypercarbic respiratory failure Progressive neuro-muscular weakness. 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  P: Supportive care will defer from further neurological testing as there is no need to duplicate what [facility] Fentanyl gtt   GI: A: Protein calorie malnutrition P: Cont tube feeds PPI for SUP  Renal: A: No acute  P: Trend lytes/replace as needed   HEME A: No acute P: [place] heparin   Infectious disease  A: UTI (Ecoli) P: Keflex started 12/7>>  Endocrine  A: Mild hyperglycemia  P: ssi   Musculoskeletal A: Foot drop   P: Podus boot (rotate q shift)  Discussed with family at bedside 12/10   Dirk DressKaty Whiteheart, NP 05/09/2016  1:37 PM Pager: [phone] or [phone]

## 2016-05-09 NOTE — Progress Notes (Signed)
[facility] due to desaturation to 87%, low TVe to 275 ml, and lethargy, will try wean later.

## 2016-05-09 NOTE — Progress Notes (Signed)
Changed vent mode to PSV 10/5 and 40% patient tolerated well, SATS 94% RR 20, HR 85, BP 137/60, will continue to monitor patient.

## 2016-05-10 LAB — BASIC METABOLIC PANEL
ANION GAP: 7 (ref 5–15)
Anion gap: 7 (ref 5–15)
BUN: 21 mg/dL — ABNORMAL HIGH (ref 6–20)
BUN: 20 mg/dL (ref 6–20)
CALCIUM: 9.3 mg/dL (ref 8.9–10.3)
CO2: 34 mmol/L — AB (ref 22–32)
CO2: 37 mmol/L — ABNORMAL HIGH (ref 22–32)
Calcium: 9.6 mg/dL (ref 8.9–10.3)
Chloride: 95 mmol/L — ABNORMAL LOW (ref 101–111)
Chloride: 92 mmol/L — ABNORMAL LOW (ref 101–111)
Creatinine, Ser: <0.3 mg/dL — ABNORMAL LOW (ref 0.44–1.00)
Creatinine, Ser: 0.3 mg/dL — ABNORMAL LOW (ref 0.44–1.00)
GFR calc Af Amer: >60 mL/min (ref >60)
Glucose, Bld: 142 mg/dL — ABNORMAL HIGH (ref 65–99)
Glucose, Bld: 125 mg/dL — ABNORMAL HIGH (ref 65–99)
POTASSIUM: 4.3 mmol/L (ref 3.5–5.1)
POTASSIUM: 4.5 mmol/L (ref 3.5–5.1)
SODIUM: 136 mmol/L (ref 135–145)
SODIUM: 136 mmol/L (ref 135–145)

## 2016-05-10 LAB — CBC
HCT: 37.2 % (ref 36.0–46.0)
HEMOGLOBIN: 11.8 g/dL — AB (ref 12.0–15.0)
MCH: 30.6 pg (ref 26.0–34.0)
MCHC: 31.7 g/dL (ref 30.0–36.0)
MCV: 96.4 fL (ref 78.0–100.0)
Platelets: 237 10*3/uL (ref 150–400)
RBC: 3.86 MIL/uL — ABNORMAL LOW (ref 3.87–5.11)
RDW: 13.2 % (ref 11.5–15.5)
WBC: 15 10*3/uL — ABNORMAL HIGH (ref 4.0–10.5)

## 2016-05-10 LAB — GLUCOSE, CAPILLARY
GLUCOSE-CAPILLARY: 164 mg/dL — AB (ref 65–99)
GLUCOSE-CAPILLARY: 138 mg/dL — AB (ref 65–99)
Glucose-Capillary: 117 mg/dL — ABNORMAL HIGH (ref 65–99)
Glucose-Capillary: 126 mg/dL — ABNORMAL HIGH (ref 65–99)
Glucose-Capillary: 139 mg/dL — ABNORMAL HIGH (ref 65–99)
Glucose-Capillary: 155 mg/dL — ABNORMAL HIGH (ref 65–99)
Glucose-Capillary: 159 mg/dL — ABNORMAL HIGH (ref 65–99)

## 2016-05-10 LAB — TROPONIN I: TROPONIN I: 0.07 ng/mL — AB (ref <0.03)

## 2016-05-10 MED ORDER — CHLORHEXIDINE GLUCONATE 0.12% ORAL RINSE (MEDLINE KIT)
15.0000 mL | Freq: Two times a day (BID) | OROMUCOSAL | Status: DC
  Administered 2016-05-10 – 2016-05-16 (×12): 15 mL via OROMUCOSAL

## 2016-05-10 MED ORDER — FENTANYL CITRATE (PF) 100 MCG/2ML IJ SOLN
12.5000 ug | INTRAMUSCULAR | Status: DC | PRN
  Administered 2016-05-11 (×4): 50 ug via INTRAVENOUS
  Filled 2016-05-10 (×4): qty 2

## 2016-05-10 MED ORDER — ORAL CARE MOUTH RINSE
15.0000 mL | Freq: Four times a day (QID) | OROMUCOSAL | Status: DC
  Administered 2016-05-11 – 2016-05-16 (×22): 15 mL via OROMUCOSAL

## 2016-05-10 NOTE — Progress Notes (Signed)
Name: Tara Cruz MRN: [id number] DOB: 09-25-1950    ADMISSION DATE:  05/04/2016   CHIEF COMPLAINT:  Shortness of Breath  Brief Hx: 65 year old female w/ prior C4-C7 ACDF back in June 2017. 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 Since this time has had progressive ascending weakness, weight loss and recurrent acute on chronic hypercarbia. She was actually referred to Dr Christene Lye (neuro surg at [facility] by Dr Shon Baton but did not get to the appointment). Intubated 12/6, tube feedings started 12/6. Started Keflex 12/7 for ecoli UTI. Accepted at [facility] by Dr Clelia Croft and awaiting bed.  STUDIES & EVENTS 10/21/15 EMG/NCS [Dr. 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 Considerable artifact from the fusion hardware. 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 Abnormal T2 signal in the cord immediately distal to that consistent with gliosis. - Facet arthropathy at C2-3 and C3-4 with neural foraminal stenosis that could be symptomatic. 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 Canal diameter 4 mm. Increasingly prominent 5 x 3 x 15 mm T2 hyperintense fluid collection dorsal to but within the surgical construct, uncertain significance.    SIGNIFICANT EVENTS  12/5 admitted [facility]; accepted at [facility] but no beds.  12/6 electively intubated. Tube feeds started.  12/10 -   Waiting for [facility] bed.  No sig change.  Agitated.  Tol PS wean  10/5 x 4 hours.  Got lethargic, hypoxic.    SUBJECTIVE/OVERNIGHT/INTERVAL HX 12/11 - On fengt gtt - nods and fully aware. Wiggle right toe , weak uppergrip bilaterally but no LLE movement. Failed SBT after 1h but on sedation gtt. No news from [facility] on bed   VITAL SIGNS: Temp:  [98.5 F (36.9 C)-100.2 F (37.9 C)] 99.7 F (37.6 C) (12/11 0803) Pulse Rate:  [76] 76 (12/11 0840) Resp:  [20] 20 (12/11 0840) BP: (122)/(57) 122/57 (12/11 0840) SpO2:  [90 %-98 %] 92 % (12/11 0844) FiO2 (%):  [40 %-50 %] 40 % (12/11 1000) Weight:  [54.8 kg (120 lb 13 oz)] 54.8 kg (120 lb 13 oz) (12/11 0500)  PHYSICAL EXAMINATION: General:  Eyes opebn. Comfortable. deconditioned Integument:  Warm & dry. No rash on exposed skin.  HEENT:  Endotracheal tube in place. No scleral injection or icterus. Cardiovascular:  Regular rate. No edema. No appreciable JVD.  Pulmonary:  Overall clear on auscultation. Symmetric chest rise on ventilator. Abdomen: Soft. Normal bowel sounds. Nondistended. Nontender. Neurological: Continues to have lower extremity weakness. Nods to questions appropriately.  Attends to voice.   PULMONARY  Recent Labs Lab 05/04/16 1214 05/05/16 0401 05/05/16 1623 05/06/16 0350  PHART 7.326* 7.391 7.450 7.426  PCO2ART 85.4* 71.8* 55.0* 56.9*  PO2ART 79.0* 80.7* 499* 112*  HCO3 44.6* 42.6* 37.6* 36.8*  TCO2 47  --   --   --   O2SAT 93.0 96.1 99.6 98.2    CBC  Recent Labs Lab 05/05/16 0149 05/06/16 0528 05/10/16 0305  HGB 14.6 12.7 11.8*  HCT 48.0* 40.4 37.2  WBC 8.2 11.0*  15.0*  PLT 273 230 237    COAGULATION No results for input(s): INR in the last 168 hours.  CARDIAC  No results for input(s): TROPONINI in the last 168 hours. No results for input(s): PROBNP in the last 168 hours.   CHEMISTRY  Recent Labs Lab 05/04/16 1430 05/05/16 0149 05/05/16 1048 05/05/16 1832 05/06/16 0528 05/06/16 1626 05/08/16 0158 05/10/16 0305  NA 142 141  --   --  141  --  138 136    K 4.4 3.6  --   --  3.7  --  4.0 4.3  CL 97* 95*  --   --  100*  --  96* 95*  CO2 43* 40*  --   --  36*  --  36* 34*  GLUCOSE 144* 131*  --   --  159*  --  124* 142*  BUN 29* 32*  --   --  38*  --  15 21*  CREATININE 0.41* 0.34*  --   --  0.30*  --  <0.30* <0.30*  CALCIUM 10.5* 10.0  --   --  9.1  --  9.2 9.3  MG 2.2  --  2.0 1.8 1.8 1.8  --   --   PHOS 2.6  --  1.7* 1.2* 1.5* 4.1  --   --    CrCl cannot be calculated (This lab value cannot be used to calculate CrCl because it is not a number: <0.30).   LIVER  Recent Labs Lab 05/04/16 1430 05/06/16 0528  AST 19 21  ALT 20 22  ALKPHOS 59 46  BILITOT 0.7 0.6  PROT 5.9* 4.2*  ALBUMIN 3.5 2.4*     INFECTIOUS No results for input(s): LATICACIDVEN, PROCALCITON in the last 168 hours.   ENDOCRINE CBG (last 3)   Recent Labs  05/10/16 0014 05/10/16 0421 05/10/16 0802  GLUCAP 155* 164* 139*         IMAGING x48h  - image(s) personally visualized  -   highlighted in bold No results found.      ASSESSMENT / PLAN:  Pulmonary A: Acute on chronic hypercarbic respiratory failure in setting of neuro-muscular weakness with underlying COPD   - fails SBT P: Full vent support VAP protocol  Intermittent abg/PCXR PAD protocol  Add BD's (spiriva, albuterol at home)  Cardiology A: Hypertension, Hyperlipidemia  CAD   -not on pressors  P: Continuous telemetry SCDs Strict I&O  Neurological A: Encephalopathy in setting of hypercarbic respiratory failure Progressive neuro-muscular weakness. 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    -no change P: Supportive care will defer from further neurological testing as there is no need to duplicate what will [facility] Fentanyl gtt   GI: A: Protein calorie malnutrition P: Cont tube feeds PPI for SUP  Renal: A: No acute  P: Trend lytes/replace as needed    HEME A: No acute P: [place] heparin   Infectious disease  A: UTI (Ecoli) P: Keflex started 12/7>>  Endocrine  A: Mild hyperglycemia  P: ssi   Musculoskeletal A: Foot drop   P: Podus boot (rotate q shift)  Discussed with family at bedside 12/10.None at bedside 05/10/2016. CAre manager to call [facility] transfer cetner and get updated 05/10/2016     The patient is critically ill with multiple organ systems failure and requires high complexity decision making for assessment and support, frequent evaluation and titration of therapies, application  of advanced monitoring technologies and extensive interpretation of multiple databases.   Critical Care Time devoted to patient care services described in this note is  30  Minutes. This time reflects time of care of this signee Dr Kalman ShanMurali Larhonda Dettloff. This critical care time does not reflect procedure time, or teaching time or supervisory time of PA/NP/Med student/Med Resident etc but could involve care discussion time    Dr. Kalman ShanMurali Elanah Osmanovic, M.D., [facility]P Pulmonary and Critical Care Medicine Staff Physician [facility] [place] Pulmonary and Critical Care Pager: [phone], If no answer or between  15:00h - 7:00h: call [phone]  05/10/2016 11:03 AM

## 2016-05-11 ENCOUNTER — Inpatient Hospital Stay (HOSPITAL_COMMUNITY): Payer: Medicare Other

## 2016-05-11 DIAGNOSIS — J9811 Atelectasis: Secondary | ICD-10-CM

## 2016-05-11 LAB — CBC WITH DIFFERENTIAL/PLATELET
BASOS PCT: 0 %
Basophils Absolute: 0 10*3/uL (ref 0.0–0.1)
EOS ABS: 0 10*3/uL (ref 0.0–0.7)
Eosinophils Relative: 0 %
HEMATOCRIT: 38.6 % (ref 36.0–46.0)
HEMOGLOBIN: 12.4 g/dL (ref 12.0–15.0)
LYMPHS ABS: 1.4 10*3/uL (ref 0.7–4.0)
LYMPHS PCT: 8 %
MCH: 30.8 pg (ref 26.0–34.0)
MCHC: 32.1 g/dL (ref 30.0–36.0)
MCV: 96 fL (ref 78.0–100.0)
Monocytes Absolute: 2.3 10*3/uL — ABNORMAL HIGH (ref 0.1–1.0)
Monocytes Relative: 13 %
NEUTROS ABS: 13.9 10*3/uL — AB (ref 1.7–7.7)
Neutrophils Relative %: 79 %
Platelets: 252 10*3/uL (ref 150–400)
RBC: 4.02 MIL/uL (ref 3.87–5.11)
RDW: 13.3 % (ref 11.5–15.5)
WBC: 17.6 10*3/uL — ABNORMAL HIGH (ref 4.0–10.5)

## 2016-05-11 LAB — GLUCOSE, CAPILLARY
GLUCOSE-CAPILLARY: 125 mg/dL — AB (ref 65–99)
GLUCOSE-CAPILLARY: 102 mg/dL — AB (ref 65–99)
GLUCOSE-CAPILLARY: 110 mg/dL — AB (ref 65–99)
Glucose-Capillary: 115 mg/dL — ABNORMAL HIGH (ref 65–99)
Glucose-Capillary: 126 mg/dL — ABNORMAL HIGH (ref 65–99)

## 2016-05-11 LAB — PHOSPHORUS: Phosphorus: 2.6 mg/dL (ref 2.5–4.6)

## 2016-05-11 LAB — TROPONIN I
TROPONIN I: 0.03 ng/mL — AB (ref <0.03)
Troponin I: <0.03 ng/mL (ref <0.03)
Troponin I: 0.03 ng/mL (ref <0.03)

## 2016-05-11 LAB — MAGNESIUM: MAGNESIUM: 2.1 mg/dL (ref 1.7–2.4)

## 2016-05-11 MED ORDER — VANCOMYCIN HCL IN DEXTROSE 1-5 GM/200ML-% IV SOLN
1000.0000 mg | Freq: Once | INTRAVENOUS | Status: AC
  Administered 2016-05-11: 1000 mg via INTRAVENOUS
  Filled 2016-05-11: qty 200

## 2016-05-11 MED ORDER — PIPERACILLIN-TAZOBACTAM 3.375 G IVPB
3.3750 g | Freq: Three times a day (TID) | INTRAVENOUS | Status: DC
  Administered 2016-05-11 – 2016-05-15 (×11): 3.375 g via INTRAVENOUS
  Filled 2016-05-11 (×13): qty 50

## 2016-05-11 MED ORDER — ALBUTEROL SULFATE (2.5 MG/3ML) 0.083% IN NEBU
2.5000 mg | INHALATION_SOLUTION | Freq: Four times a day (QID) | RESPIRATORY_TRACT | Status: DC
  Administered 2016-05-11 – 2016-05-16 (×20): 2.5 mg via RESPIRATORY_TRACT
  Filled 2016-05-11 (×20): qty 3

## 2016-05-11 MED ORDER — SODIUM CHLORIDE 3 % IN NEBU
4.0000 mL | INHALATION_SOLUTION | Freq: Two times a day (BID) | RESPIRATORY_TRACT | Status: DC
  Administered 2016-05-11 – 2016-05-13 (×4): 4 mL via RESPIRATORY_TRACT
  Filled 2016-05-11 (×5): qty 4

## 2016-05-11 MED ORDER — VITAL AF 1.2 CAL PO LIQD
1000.0000 mL | ORAL | Status: DC
  Administered 2016-05-11 – 2016-05-15 (×5): 1000 mL
  Filled 2016-05-11 (×2): qty 1000

## 2016-05-11 MED ORDER — VANCOMYCIN HCL 500 MG IV SOLR
500.0000 mg | Freq: Two times a day (BID) | INTRAVENOUS | Status: DC
  Administered 2016-05-12 – 2016-05-13 (×3): 500 mg via INTRAVENOUS
  Filled 2016-05-11 (×5): qty 500

## 2016-05-11 MED ORDER — ACETYLCYSTEINE 20 % IN SOLN
1.0000 mL | Freq: Four times a day (QID) | RESPIRATORY_TRACT | Status: AC
  Administered 2016-05-11 – 2016-05-12 (×4): 1 mL via RESPIRATORY_TRACT
  Filled 2016-05-11 (×4): qty 4

## 2016-05-11 MED ORDER — FENTANYL CITRATE (PF) 100 MCG/2ML IJ SOLN
12.5000 ug | INTRAMUSCULAR | Status: DC | PRN
  Administered 2016-05-12 – 2016-05-16 (×13): 50 ug via INTRAVENOUS
  Filled 2016-05-11 (×13): qty 2

## 2016-05-11 MED ORDER — SODIUM CHLORIDE 3 % IN NEBU
4.0000 mL | INHALATION_SOLUTION | Freq: Two times a day (BID) | RESPIRATORY_TRACT | Status: DC
Start: 2016-05-11 — End: 2016-05-11
  Filled 2016-05-11: qty 4

## 2016-05-11 MED ORDER — MIDAZOLAM HCL 2 MG/2ML IJ SOLN
INTRAMUSCULAR | Status: AC
  Filled 2016-05-11: qty 2

## 2016-05-11 MED ORDER — MIDAZOLAM HCL 2 MG/2ML IJ SOLN
2.0000 mg | Freq: Once | INTRAMUSCULAR | Status: AC
  Administered 2016-05-11: 2 mg via INTRAVENOUS

## 2016-05-11 NOTE — Progress Notes (Signed)
Received report on patient. Into room to assess low O2 sats 87% on 50% Oxygen. Increased oxygen to 70% and then 100% respiritory therapy to bedside administered breathing treatment. Dr. Marchelle Gearingamaswamy advised of patient  Decreased sats and increased oxygen demand. Will continue to monitor patient very closely. Patient was very restless, sedation administered as per MD orders.

## 2016-05-11 NOTE — Progress Notes (Signed)
Pt getting a bath at this time, placed back on full support due to increased RR.  RT will monitor.

## 2016-05-11 NOTE — Procedures (Signed)
Bedside Bronchoscopy Procedure Note Tara Bottcheratricia A Alatorre [id number] 04/18/1951  Procedure: Bronchoscopy Indications: Diagnostic evaluation of the airways, Obtain specimens for culture and/or other diagnostic studies and Remove secretions  Procedure Details: ET Tube Size: ET Tube secured at lip (cm): Bite block in place: Yes In preparation for procedure, Patient hyper-oxygenated with 100 % FiO2 and Saline given via ETT (60 ml) Airway entered and the following bronchi were examined: RUL, RML, RLL, LUL and LLL.   Bronchoscope removed.  , Patient placed back on 100% FiO2 at conclusion of procedure.    Evaluation BP (!) 100/54   Pulse 85   Temp 98.8 F (37.1 C) (Oral)   Resp 16   Ht 5\' 4"  (1.626 m)   Wt 125 lb 3.5 oz (56.8 kg)   SpO2 98%   BMI 21.49 kg/m  Breath Sounds:Clear and Diminished O2 sats: stable throughout Patient's Current Condition: stable Specimens:  Sent  Complications: No apparent complications Patient did tolerate procedure well.   Cherylin MylarDoyle, Oskar Cretella 05/11/2016, 3:55 PM

## 2016-05-11 NOTE — Progress Notes (Signed)
85cc of fentanyl wasted in sink by Hazel Samshristian Smith. Melina Schoolshompson, Caragh Gasper E, RN 05/11/2016 5:49 AM

## 2016-05-11 NOTE — Progress Notes (Signed)
Name: Tara Bottcheratricia A Cruz MRN: [id number] DOB: [birth year]    ADMISSION DATE:  05/04/2016   CHIEF COMPLAINT:  Shortness of Breath  Brief Hx: 65 year old female w/ prior C4-C7 ACDF back in June 2017. 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 Since this time has had progressive ascending weakness, weight loss and recurrent acute on chronic hypercarbia. She was actually referred to Dr Christene LyeKarikari [facility] Dr Shon BatonBrooks but did not get to the appointment). Intubated 12/6, tube feedings started 12/6. Started Keflex 12/7 for ecoli UTI. [facility] Dr Clelia CroftShaw and still  awaiting bed assignment.  STUDIES & EVENTS 10/21/15 EMG/NCS [Dr. 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 Considerable artifact from the fusion hardware. 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 Abnormal T2 signal in the cord immediately distal to that consistent with gliosis. - Facet arthropathy at C2-3 and C3-4 with neural foraminal stenosis that could be symptomatic. 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 Canal diameter 4 mm. Increasingly prominent 5 x 3 x 15 mm T2 hyperintense fluid collection dorsal to but within the surgical construct, uncertain significance.    SIGNIFICANT EVENTS  [facility].  12/6 electively intubated. Tube feeds started.  12/10 -   Waiting for [facility] bed.  No sig change.   Agitated.  Tol PS wean 10/5 x 4 hours.  Got lethargic, hypoxic.    SUBJECTIVE/OVERNIGHT/INTERVAL HX 12/11 - Off  fengt gtt, now just prn pushes - nods and fully aware but anxious. Wiggle right toe , weak uppergrip bilaterally but no LLE movement. Failed SBT with episodes of apnea after 1h but on sedation gtt. [facility]   VITAL SIGNS: Temp:  [98.8 F (37.1 C)-100.3 F (37.9 C)] 98.8 F (37.1 C) (12/12 0808) Pulse Rate:  [73-115] 97 (12/12 0806) Resp:  [15-27] 24 (12/12 0806) BP: (92-162)/(49-120) 154/78 (12/12 0600) SpO2:  [90 %-99 %] 96 % (12/12 0806) FiO2 (%):  [40 %] 40 % (12/12 0806) Weight:  [125 lb 3.5 oz (56.8 kg)] 125 lb 3.5 oz (56.8 kg) (12/12 0327)  PHYSICAL EXAMINATION: General:  Eyes open. Comfortable. Deconditioned. Alert and anxious. Integument:  Warm & dry. No rash on exposed skin.  HEENT:  Endotracheal tube in place. No scleral injection or icterus. Cardiovascular:  Regular rate. Trace edema. No appreciable JVD.  Pulmonary:  Overall clear on auscultation. Symmetric chest rise on ventilator. Abdomen: Soft. Normal bowel sounds. Nondistended. Nontender. Neurological: Continues to have lower extremity weakness. Nods to questions appropriately.  Attends to voice.   PULMONARY  Recent Labs Lab 05/04/16 1214 05/05/16 0401 05/05/16 1623 05/06/16 0350  PHART 7.326* 7.391 7.450 7.426  PCO2ART 85.4* 71.8* 55.0* 56.9*  PO2ART 79.0* 80.7* 499* 112*  HCO3 44.6* 42.6* 37.6* 36.8*  TCO2 47  --   --   --   O2SAT 93.0 96.1 99.6 98.2    CBC  Recent Labs Lab 05/06/16 0528 05/10/16 0305  05/11/16 0254  HGB 12.7 11.8* 12.4  HCT 40.4 37.2 38.6  WBC 11.0* 15.0* 17.6*  PLT 230 237 252    COAGULATION No results for input(s): INR in the last 168 hours.  CARDIAC    Recent Labs Lab 05/10/16 1620 05/10/16 2133 05/11/16 0254  TROPONINI <0.03 0.07* <0.03   No results for input(s): PROBNP in the last 168 hours.   CHEMISTRY  Recent Labs Lab  05/05/16 0149 05/05/16 1048 05/05/16 1832 05/06/16 0528 05/06/16 1626 05/08/16 0158 05/10/16 0305 05/10/16 2133 05/11/16 0254  NA 141  --   --  141  --  138 136 136  --   K 3.6  --   --  3.7  --  4.0 4.3 4.5  --   CL 95*  --   --  100*  --  96* 95* 92*  --   CO2 40*  --   --  36*  --  36* 34* 37*  --   GLUCOSE 131*  --   --  159*  --  124* 142* 125*  --   BUN 32*  --   --  38*  --  15 21* 20  --   CREATININE 0.34*  --   --  0.30*  --  <0.30* <0.30* 0.30*  --   CALCIUM 10.0  --   --  9.1  --  9.2 9.3 9.6  --   MG  --  2.0 1.8 1.8 1.8  --   --   --  2.1  PHOS  --  1.7* 1.2* 1.5* 4.1  --   --   --  2.6   Estimated Creatinine Clearance: 58.9 mL/min (by C-G formula based on SCr of 0.3 mg/dL (L)).   LIVER  Recent Labs Lab 05/04/16 1430 05/06/16 0528  AST 19 21  ALT 20 22  ALKPHOS 59 46  BILITOT 0.7 0.6  PROT 5.9* 4.2*  ALBUMIN 3.5 2.4*     INFECTIOUS No results for input(s): LATICACIDVEN, PROCALCITON in the last 168 hours.   ENDOCRINE CBG (last 3)   Recent Labs  05/10/16 2348 05/11/16 0410 05/11/16 0805  GLUCAP 126* 115* 125*         IMAGING x48h   No results found.      ASSESSMENT / PLAN:  Pulmonary A: Acute on chronic hypercarbic respiratory failure in setting of neuro-muscular weakness with underlying COPD Weaning 5/14 P: Full vent support Wean as able  VAP protocol  Intermittent abg/PCXR PAD protocol  Add BD's (spiriva, albuterol at home)  Cardiology A: Hypertension, Hyperlipidemia  CAD Troponin elevation resolved   -not on pressors  P: Continuous telemetry SCDs Strict I&O  Neurological A: Encephalopathy in setting of hypercarbic respiratory failure Progressive neuro-muscular weakness. 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  - awaiting [facility] bed for evaluation  -no change P: Supportive care will defer from further neurological testing  as there is no need to duplicate what will [facility] Fentanyl pushes prn  GI: A: Protein calorie malnutrition TF at goal P: Cont tube feeds PPI for SUP Monitor BM's  Renal: A: No acute  P: Trend lytes/replace as needed  Monitor urine output  HEME A: No acute P: [place] heparin  Monitor for bleeding  Infectious disease  A: UTI (Ecoli) Elevated WBC Low grade temp P: Keflex started 12/7>> Sputum/ Blood Cx   Endocrine  A: Mild hyperglycemia  P: ssi   Musculoskeletal A: Foot drop   P: Podus boot (rotate q shift)  Discussed with patient at bedside 12/12, as there was no family at bedside. Care manager to call [facility] transfer center, may have bed availability this afternoon. They have the unit number to call.     Bevelyn NgoSarah F. Groce, AGACNP-BC [facility] Pulmonary/Critical Care Medicine Pager # [phone]  05/11/2016 8:55 AM

## 2016-05-11 NOTE — Care Management Note (Addendum)
Case Management Note  Patient Details  Name: Tara Bottcheratricia A Doolen MRN: [id number] Date of Birth: 29-May-1951  Subjective/Objective:   Pt admitted with acute on chronic resp failure - per PA pt will be intubated today - pt is now in distress on BIPAP                 Action/Plan:  PTA from home - onging worsening neuro-muscular weakness due to cervical spinal cord compression. Attending has arranged for pt to be transferred to [facility]:  she has been accepted to the Neuro-critical care Dr Sherryll BurgerShah. Currently no beds so we will cont supportive care in the mean time.  CM will continue to monitor for discharge needs ;    Expected Discharge Date:                  Expected Discharge Plan:  Acute to Acute Transfer  In-House Referral:     Discharge planning Services  CM Consult  Post Acute Care Choice:    Choice offered to:     DME Arranged:    DME Agency:     HH Arranged:    HH Agency:     Status of Service:  In process, will continue to follow  If discussed at Long Length of Stay Meetings, dates discussed:    Additional Comments: 05/13/2016 Discussed in LOS 05/14/16:  Pt remains appropriate for continued stay.  [facility] - attending group refused transfer to another hospital siting that [facility]  CM will continue to follow fro discharge needs  05/11/2016  Pt discussed in LOS 05/11/16- pt remains appropriate for continued stay.  [facility] Katiria Calame S, RN 05/11/2016, 1:27 PM

## 2016-05-11 NOTE — Progress Notes (Signed)
Nutrition Follow-up  DOCUMENTATION CODES:   Severe malnutrition in context of chronic illness, Underweight  INTERVENTION:    To better meet re-estimated needs, Increase Vital AF 1.2 to 50 ml/h (1200 ml per day) to provide 1440 kcal, 90 gm protein, 973 ml free water daily.  NUTRITION DIAGNOSIS:   Malnutrition related to chronic illness as evidenced by severe depletion of muscle mass, percent weight loss (29% weight loss within the past 6 months).  Ongoing  GOAL:   Patient will meet greater than or equal to 90% of their needs  Progressing  MONITOR:   Vent status, TF tolerance, Labs, I & O's, Skin  ASSESSMENT:   65 year old female admitted to [facility] on 12/5 from outside hospital with dyspnea and progressive hypercarbic respiratory failure. Hx of C4-C7 ACDF back surgery in June 2017. Has had progressive weakness and weight loss since back surgery.  Patient is currently intubated on ventilator support MV: 10.3 L/min Temp (24hrs), Avg:99.4 F (37.4 C), Min:98.8 F (37.1 C), Max:100.3 F (37.9 C)  Patient is currently receiving Vital AF 1.2 via OGT at 45 ml/h (1080 ml/day) to provide 1296 kcals, 81 gm protein, 876 ml free water daily.   Diet Order:  Diet NPO time specified  Skin:  Wound (see comment) (stage 1 pressure injuries to bilateral heels)  Last BM:  unknown  Height:   Ht Readings from Last 1 Encounters:  05/05/16 5\' 4"  (1.626 m)    Weight:   Wt Readings from Last 1 Encounters:  05/11/16 125 lb 3.5 oz (56.8 kg)   05/04/16 108 lb 7.5 oz (49.2 kg)   Ideal Body Weight:  59.1 kg  BMI:  Body mass index is 21.49 kg/m.  Estimated Nutritional Needs:   Kcal:  1486  Protein:  80-90 gm  Fluid:  >/= 1.6 L  EDUCATION NEEDS:   No education needs identified at this time  Joaquin CourtsKimberly Harris, RD, LDN, CNSC Pager [phone] After Hours Pager [phone]

## 2016-05-11 NOTE — Progress Notes (Signed)
   05/11/16 0930  Clinical Encounter Type  Visited With Patient  Visit Type Other (Comment) ([place] consult)  Referral From Family  Consult/Referral To Chaplain  Spiritual Encounters  Spiritual Needs Emotional  Stress Factors  Patient Stress Factors Health changes  Introduction to pt. Started to complete Advanced Directive after explaining. Pt having difficulty with motor skills. Follow up tomorrow to notarize and get witnesses.

## 2016-05-11 NOTE — Procedures (Signed)
Bronchoscopy Procedure Note Tara Bottcheratricia A Bala [id number] Sep 13, 1950  Procedure: Bronchoscopy Indications: Remove secretions  Procedure Details Consent: Unable to obtain consent because of altered level of consciousness. Time Out: Verified patient identification, verified procedure, site/side was marked, verified correct patient position, special equipment/implants available, medications/allergies/relevent history reviewed, required imaging and test results available.  Performed  In preparation for procedure, patient was given 100% FiO2 and bronchoscope lubricated. Sedation: Benzodiazepines  Airway entered and the following bronchi were examined: RUL, RML, RLL, LUL, LLL and Bronchi.  The RUL,RML and RLL were clear. There was copious thick white secretions suctioned, lavaged w/ normal saline.   Procedures performed: BAL from left lower lobe  Bronchoscope removed.    Evaluation Hemodynamic Status: BP stable throughout; O2 sats: stable throughout Patient's Current Condition: stable Specimens:  Sent purulent fluid Complications: No apparent complications Patient did tolerate procedure well. Suspect that desaturation events are a mix of evolving LLL pneumonia and mucous plugging   Will increase PEEP to 10 Cont to wean O2 for sats > 92% We have already widened abx I have sent a culture/BAL from the LLL Will add hypertonic saline nebs w/ 24hrs mucomyst already ordered.   Tara MartinetPeter E Nellie Cruz ACNP-BC [facility] Pulmonary/Critical Care Pager # [phone] OR # [phone] if no answer   Tara Cruz 05/11/2016

## 2016-05-11 NOTE — Progress Notes (Signed)
Spiritual Care has been consulted for POA paperwork. CSW signing off at this time. Please contact should new need(s) arise.    Enos FlingAshley Danthony Kendrix, MSW, LCSW [facility] ED/[facility]M Clinical Social Worker [phone]

## 2016-05-11 NOTE — Progress Notes (Signed)
Critical troponin value reported to MD 0.03

## 2016-05-11 NOTE — Procedures (Signed)
Called to room due to decreased spo2 82%, fio2 inc to 100% at this time. ETT remains 22 @ lip, spo2 slow to recover, MD notified, cxr pending.

## 2016-05-11 NOTE — Progress Notes (Signed)
Evolving HCAP LLL w/ transient mucous plugging Plan Increase peep to 8 Cont BDs Add HCAP coverage  Simonne MartinetPeter E Babcock ACNP-BC [facility] Pulmonary/Critical Care Pager # [phone] OR # [phone] if no answer

## 2016-05-11 NOTE — Care Management Important Message (Signed)
Important Message  Patient Details  Name: Tara Cruz MRN: [id number] Date of Birth: Oct 01, 1950   Medicare Important Message Given:  Yes    [name] Abena 05/11/2016, 10:03 AM

## 2016-05-11 NOTE — Progress Notes (Signed)
Pharmacy Antibiotic Note  Tara Cruz is a 65 y.o. female admitted on 05/04/2016 with HCAP.  Pharmacy has been consulted for Vancomycin and Zosyn dosing. Increasing leukocytosis, worsening O2 sats and mucus plugging requiring increased ventilator support. BP is soft. Tmax 100.3. SCr remains low-stable (may be falsely low in setting of low muscle mass). Good UOP charted at 0.9 cc/kg/hr.   Plan: Vancomycin 1000mg  IV x1 then 500mg  IV every 12 hours.  Goal trough 15-20 mcg/mL. Zosyn 3.375g IV q8h (4 hour infusion).  Monitor renal function, culture results, and clinical status.   Height: 5\' 4"  (162.6 cm) Weight: 125 lb 3.5 oz (56.8 kg) IBW/kg (Calculated) : 54.7  Temp (24hrs), Avg:99.4 F (37.4 C), Min:98.8 F (37.1 C), Max:100.3 F (37.9 C)   Recent Labs Lab 05/04/16 1511 05/05/16 0149 05/06/16 0528 05/08/16 0158 05/10/16 0305 05/10/16 2133 05/11/16 0254  WBC 6.2 8.2 11.0*  --  15.0*  --  17.6*  CREATININE  --  0.34* 0.30* <0.30* <0.30* 0.30*  --     Estimated Creatinine Clearance: 58.9 mL/min (by C-G formula based on SCr of 0.3 mg/dL (L)).    No Known Allergies  Antimicrobials this admission: Vancomycin 12/12 >> Zosyn 12/12 >> Keflex 12/7 >>12/12  Dose adjustments this admission:  Microbiology results: 12/12 BCx:  12/5 UCx: Ecoli (Tx Keflex) 12/12 Sputum:  12/5 MRSA PCR: negative  Thank you for allowing pharmacy to be a part of this patient's care.  Link SnufferJessica Shayonna Ocampo, PharmD, BCPS Clinical Pharmacist [phone] 05/11/2016 2:57 PM

## 2016-05-12 ENCOUNTER — Encounter (HOSPITAL_COMMUNITY): Payer: Self-pay | Admitting: General Practice

## 2016-05-12 ENCOUNTER — Inpatient Hospital Stay (HOSPITAL_COMMUNITY): Payer: Medicare Other

## 2016-05-12 LAB — CBC WITH DIFFERENTIAL/PLATELET
BASOS ABS: 0 10*3/uL (ref 0.0–0.1)
BASOS PCT: 0 %
EOS ABS: 0 10*3/uL (ref 0.0–0.7)
Eosinophils Relative: 0 %
HEMATOCRIT: 35.5 % — AB (ref 36.0–46.0)
HEMOGLOBIN: 11.4 g/dL — AB (ref 12.0–15.0)
Lymphocytes Relative: 3 %
Lymphs Abs: 0.5 10*3/uL — ABNORMAL LOW (ref 0.7–4.0)
MCH: 30.6 pg (ref 26.0–34.0)
MCHC: 32.1 g/dL (ref 30.0–36.0)
MCV: 95.2 fL (ref 78.0–100.0)
MONOS PCT: 15 %
Monocytes Absolute: 2.2 10*3/uL — ABNORMAL HIGH (ref 0.1–1.0)
NEUTROS ABS: 11.6 10*3/uL — AB (ref 1.7–7.7)
NEUTROS PCT: 82 %
Platelets: 265 10*3/uL (ref 150–400)
RBC: 3.73 MIL/uL — AB (ref 3.87–5.11)
RDW: 13.4 % (ref 11.5–15.5)
WBC: 14.2 10*3/uL — AB (ref 4.0–10.5)

## 2016-05-12 LAB — GLUCOSE, CAPILLARY
GLUCOSE-CAPILLARY: 127 mg/dL — AB (ref 65–99)
GLUCOSE-CAPILLARY: 130 mg/dL — AB (ref 65–99)
GLUCOSE-CAPILLARY: 135 mg/dL — AB (ref 65–99)
GLUCOSE-CAPILLARY: 137 mg/dL — AB (ref 65–99)
Glucose-Capillary: 134 mg/dL — ABNORMAL HIGH (ref 65–99)
Glucose-Capillary: 140 mg/dL — ABNORMAL HIGH (ref 65–99)
Glucose-Capillary: 144 mg/dL — ABNORMAL HIGH (ref 65–99)

## 2016-05-12 LAB — PHOSPHORUS: PHOSPHORUS: 3 mg/dL (ref 2.5–4.6)

## 2016-05-12 LAB — BASIC METABOLIC PANEL
ANION GAP: 8 (ref 5–15)
BUN: 18 mg/dL (ref 6–20)
CALCIUM: 9.4 mg/dL (ref 8.9–10.3)
CO2: 35 mmol/L — ABNORMAL HIGH (ref 22–32)
Chloride: 93 mmol/L — ABNORMAL LOW (ref 101–111)
Glucose, Bld: 131 mg/dL — ABNORMAL HIGH (ref 65–99)
Potassium: 3.7 mmol/L (ref 3.5–5.1)
SODIUM: 136 mmol/L (ref 135–145)

## 2016-05-12 LAB — TROPONIN I
Troponin I: 0.05 ng/mL (ref <0.03)
Troponin I: 0.03 ng/mL (ref <0.03)

## 2016-05-12 LAB — MAGNESIUM: Magnesium: 2.2 mg/dL (ref 1.7–2.4)

## 2016-05-12 MED ORDER — SODIUM CHLORIDE 0.9% FLUSH: 10.0000 mL | INTRAVENOUS | Status: DC | PRN

## 2016-05-12 MED ORDER — SODIUM CHLORIDE 0.9% FLUSH
10.0000 mL | Freq: Two times a day (BID) | INTRAVENOUS | Status: DC
  Administered 2016-05-12 – 2016-05-13 (×2): 10 mL

## 2016-05-12 NOTE — Progress Notes (Signed)
[facility] center called, spoke with Tresa EndoKelly, RN stated that the hospital is on hold for admissions due to no beds. Is still on list, with hopes of hold coming off tomorrow.

## 2016-05-12 NOTE — Progress Notes (Signed)
Name: Tara Cruz MRN: [id number] DOB: [birth year]    ADMISSION DATE:  05/04/2016   CHIEF COMPLAINT:  Shortness of Breath  Brief Hx: 65 year old female w/ prior C4-C7 ACDF back in June 2017. 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 Since this time has had progressive ascending weakness, weight loss and recurrent acute on chronic hypercarbia. She was actually referred to Dr Christene LyeKarikari [facility] Dr Shon BatonBrooks but did not get to the appointment). Intubated 12/6, tube feedings started 12/6. Started Keflex 12/7 for ecoli UTI. [facility] Clelia CroftShaw and still  awaiting bed assignment.  STUDIES & EVENTS 10/21/15 EMG/NCS [Dr. 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 Considerable artifact from the fusion hardware. 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 Abnormal T2 signal in the cord immediately distal to that consistent with gliosis. - Facet arthropathy at C2-3 and C3-4 with neural foraminal stenosis that could be symptomatic. 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 Canal diameter 4 mm. Increasingly prominent 5 x 3 x 15 mm T2 hyperintense fluid collection dorsal to but within the surgical construct, uncertain significance.    SIGNIFICANT EVENTS  [facility]5 [facility]; [facility] but [facility].  12/6 electively intubated. Tube feeds started.  12/10 -   Waiting for [facility] bed.  No sig change.   Agitated.  Tol PS wean 10/5 x 4 hours.  Got lethargic, hypoxic.    SUBJECTIVE/OVERNIGHT/INTERVAL HX 12/12. Still [facility]   VITAL SIGNS: Temp:  [97.4 F (36.3 C)-100 F (37.8 C)] 98.8 F (37.1 C) (12/13 1147) Pulse Rate:  [76-107] 81 (12/13 1104) Resp:  [15-25] 24 (12/13 1104) BP: (89-161)/(49-78) 96/50 (12/13 1100) SpO2:  [90 %-100 %] 97 % (12/13 1104) FiO2 (%):  [40 %-100 %] 40 % (12/13 1104) Weight:  [118 lb 9.7 oz (53.8 kg)] 118 lb 9.7 oz (53.8 kg) (12/13 0241)  PHYSICAL EXAMINATION: General:  Eyes open. Comfortable. Deconditioned. Alert and anxious. PRN sedation. Integument:  Warm & dry. No rash on exposed skin.  HEENT:  Endotracheal tube in place. No scleral injection or icterus. Cardiovascular:  Regular rate. Trace edema. No appreciable JVD.  Pulmonary:  Overall clear on auscultation. Symmetric chest rise on ventilator. Abdomen: Soft. Normal bowel sounds. Nondistended. Nontender. Neurological: Continues to have lower extremity weakness. Nods to questions appropriately.  Attends to voice.   PULMONARY  Recent Labs Lab 05/05/16 1623 05/06/16 0350  PHART 7.450 7.426  PCO2ART 55.0* 56.9*  PO2ART 499* 112*  HCO3 37.6* 36.8*  O2SAT 99.6 98.2    CBC  Recent Labs Lab 05/10/16 0305 05/11/16 0254 05/12/16 0704  HGB 11.8* 12.4 11.4*  HCT 37.2 38.6 35.5*  WBC 15.0* 17.6* 14.2*  PLT 237 252 265    COAGULATION No results for input(s): INR in the last 168 hours.  CARDIAC    Recent Labs Lab 05/11/16 1026 05/11/16 1816 05/12/16 0002 05/12/16 0704 05/12/16 1026  TROPONINI 0.03* 0.03* 0.05* <0.03 0.03*  No results for input(s): PROBNP in the last 168 hours.   CHEMISTRY  Recent Labs Lab 05/05/16 1832  05/06/16 0528 05/06/16 1626 05/08/16 0158 05/10/16 0305 05/10/16 2133 05/11/16 0254 05/12/16 0704  NA  --   --  141  --  138 136 136  --  136  K  --   < > 3.7  --  4.0 4.3 4.5  --  3.7  CL  --   --  100*  --  96* 95* 92*  --  93*  CO2   --   --  36*  --  36* 34* 37*  --  35*  GLUCOSE  --   --  159*  --  124* 142* 125*  --  131*  BUN  --   --  38*  --  15 21* 20  --  18  CREATININE  --   --  0.30*  --  <0.30* <0.30* 0.30*  --  <0.30*  CALCIUM  --   --  9.1  --  9.2 9.3 9.6  --  9.4  MG 1.8  --  1.8 1.8  --   --   --  2.1 2.2  PHOS 1.2*  --  1.5* 4.1  --   --   --  2.6 3.0  < > = values in this interval not displayed. CrCl cannot be calculated (This lab value cannot be used to calculate CrCl because it is not a number: <0.30).   LIVER  Recent Labs Lab 05/06/16 0528  AST 21  ALT 22  ALKPHOS 46  BILITOT 0.6  PROT 4.2*  ALBUMIN 2.4*     INFECTIOUS No results for input(s): LATICACIDVEN, PROCALCITON in the last 168 hours.   ENDOCRINE CBG (last 3)   Recent Labs  05/12/16 0019 05/12/16 0347 05/12/16 0811  GLUCAP 134* 137* 140*         IMAGING x48h   Dg Chest Port 1 View  Result Date: 05/12/2016 CLINICAL DATA:  Respiratory failure.  Hypoxia. EXAM: PORTABLE CHEST 1 VIEW COMPARISON:  05/11/2016. FINDINGS: Endotracheal tube and NG tube in stable position. Heart size normal. Lobe infiltrate. Persistent bibasilar atelectasis. Mild infiltrate right lung base cannot be excluded on today's exam. No pleural effusion or pneumothorax. Prior cervical spine fusion. IMPRESSION: 1.  Slight improvement of left lower lobe infiltrate. 2.Mild new infiltrate right lung base cannot be excluded on today's exam. Bibasilar atelectasis. Electronically Signed   By: Maisie Fushomas  Register   On: 05/12/2016 06:52   Dg Chest Port 1 View  Result Date: 05/11/2016 CLINICAL DATA:  65 year old female with increased difficulty breathing despite intubation. Query ET tube position. Initial encounter. EXAM: PORTABLE CHEST 1 VIEW COMPARISON:  05/05/2016 and earlier. FINDINGS: Portable AP semi upright view at 1230 hours. Endotracheal tube tip position appears adequate at the level of the clavicles. Superimposed enteric tube now coursing to the abdomen  with side hole in the left upper quadrant, and cervical ACDF hardware. New confluent retrocardiac and left lung base opacity obscuring the left hemidiaphragm. Mediastinal contours appear stable. The right lung remains clear. No pneumothorax or pulmonary edema. Possible small left pleural effusion. IMPRESSION: 1. Left lower lobe consolidation new from prior study and compatible with aspiration and/or pneumonia. 2. Endotracheal tube tip position appears adequate at the level the clavicles. 3. Enteric tube now in place, looped in the stomach. Electronically Signed   By: Odessa FlemingH  Hall M.D.   On: 05/11/2016 12:47  ASSESSMENT / PLAN:  Pulmonary A: Acute on chronic hypercarbic respiratory failure in setting of neuro-muscular weakness with underlying COPD Weaning 5/14 FOB 12/12 P: Full vent support Wean as able  VAP protocol  Intermittent abg/PCXR PAD protocol  Add BD's (spiriva, albuterol at home)  Cardiology A: Hypertension, Hyperlipidemia  CAD Troponin elevation resolved   -not on pressors  P: Continuous telemetry SCDs Strict I&O  Neurological A: Encephalopathy in setting of hypercarbic respiratory failure Progressive neuro-muscular weakness. 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  - awaiting [facility] bed for evaluation  -no change P: Supportive care will defer from further neurological testing as there is no need to duplicate what will likely all be repeated at [facility] Fentanyl pushes prn  GI: A: Protein calorie malnutrition TF at goal P: Cont tube feeds PPI for SUP Monitor BM's  Renal: A: No acute  P: Trend lytes/replace as needed  Monitor urine output  HEME A: No acute P: [place]ley heparin  Monitor for bleeding  Infectious disease  A: UTI (Ecoli) Elevated WBC Low grade temp P: 12/12 v/z started   Endocrine  CBG (last 3)   Recent Labs  05/12/16 0019 05/12/16 0347  05/12/16 0811  GLUCAP 134* 137* 140*     A: Mild hyperglycemia  P: ssi   Musculoskeletal A: Foot drop   P: Podus boot (rotate q shift)  Discussed with patient at bedside 12/13, as there was no family at bedside. Care manager to call [facility] transfer center, may have bed availability this afternoon. They have the unit number to call.  -CCT=30 min   Brett Canales Charlea Nardo ACNP [facility] Pager [phone] till 3 pm If no answer page [phone] 05/12/2016, 11:59 AM

## 2016-05-12 NOTE — Progress Notes (Signed)
Peripherally Inserted Central Catheter/Midline Placement  The IV Nurse has discussed with the patient and/or persons authorized to consent for the patient, the purpose of this procedure and the potential benefits and risks involved with this procedure.  The benefits include less needle sticks, lab draws from the catheter, and the patient may be discharged home with the catheter. Risks include, but not limited to, infection, bleeding, blood clot (thrombus formation), and puncture of an artery; nerve damage and irregular heartbeat and possibility to perform a PICC exchange if needed/ordered by physician.  Alternatives to this procedure were also discussed.  Bard Power PICC patient education guide, fact sheet on infection prevention and patient information card has been provided to patient /or left at bedside.    PICC/Midline Placement Documentation        Jeziah Kretschmer, Lajean ManesKerry Loraine 05/12/2016, 7:23 PM

## 2016-05-13 ENCOUNTER — Inpatient Hospital Stay (HOSPITAL_COMMUNITY): Payer: Medicare Other

## 2016-05-13 LAB — BASIC METABOLIC PANEL
ANION GAP: 8 (ref 5–15)
BUN: 23 mg/dL — ABNORMAL HIGH (ref 6–20)
CALCIUM: 9.1 mg/dL (ref 8.9–10.3)
CHLORIDE: 92 mmol/L — AB (ref 101–111)
CO2: 38 mmol/L — ABNORMAL HIGH (ref 22–32)
Glucose, Bld: 136 mg/dL — ABNORMAL HIGH (ref 65–99)
Potassium: 3.5 mmol/L (ref 3.5–5.1)
SODIUM: 138 mmol/L (ref 135–145)

## 2016-05-13 LAB — CULTURE, RESPIRATORY

## 2016-05-13 LAB — GLUCOSE, CAPILLARY
GLUCOSE-CAPILLARY: 142 mg/dL — AB (ref 65–99)
GLUCOSE-CAPILLARY: 130 mg/dL — AB (ref 65–99)
GLUCOSE-CAPILLARY: 146 mg/dL — AB (ref 65–99)
Glucose-Capillary: 134 mg/dL — ABNORMAL HIGH (ref 65–99)
Glucose-Capillary: 140 mg/dL — ABNORMAL HIGH (ref 65–99)

## 2016-05-13 LAB — CBC WITH DIFFERENTIAL/PLATELET
BAND NEUTROPHILS: 2 %
BASOS ABS: 0 10*3/uL (ref 0.0–0.1)
Basophils Relative: 0 %
Blasts: 0 %
EOS ABS: 0 10*3/uL (ref 0.0–0.7)
EOS PCT: 0 %
HCT: 33.9 % — ABNORMAL LOW (ref 36.0–46.0)
HEMOGLOBIN: 10.8 g/dL — AB (ref 12.0–15.0)
Lymphocytes Relative: 3 %
Lymphs Abs: 0.4 10*3/uL — ABNORMAL LOW (ref 0.7–4.0)
MCH: 30.9 pg (ref 26.0–34.0)
MCHC: 31.9 g/dL (ref 30.0–36.0)
MCV: 97.1 fL (ref 78.0–100.0)
METAMYELOCYTES PCT: 0 %
MONO ABS: 1.8 10*3/uL — AB (ref 0.1–1.0)
MONOS PCT: 12 %
Myelocytes: 0 %
NEUTROS PCT: 83 %
Neutro Abs: 12.4 10*3/uL — ABNORMAL HIGH (ref 1.7–7.7)
OTHER: 0 %
PROMYELOCYTES ABS: 0 %
Platelets: 310 10*3/uL (ref 150–400)
RBC: 3.49 MIL/uL — ABNORMAL LOW (ref 3.87–5.11)
RDW: 13.8 % (ref 11.5–15.5)
WBC: 14.6 10*3/uL — AB (ref 4.0–10.5)
nRBC: 0 /100 WBC

## 2016-05-13 LAB — MAGNESIUM: Magnesium: 2.2 mg/dL (ref 1.7–2.4)

## 2016-05-13 LAB — CULTURE, RESPIRATORY W GRAM STAIN

## 2016-05-13 LAB — PHOSPHORUS: PHOSPHORUS: 2.9 mg/dL (ref 2.5–4.6)

## 2016-05-13 MED ORDER — POTASSIUM CHLORIDE 20 MEQ/15ML (10%) PO SOLN
40.0000 meq | Freq: Once | ORAL | Status: AC
  Administered 2016-05-13: 40 meq via ORAL
  Filled 2016-05-13: qty 30

## 2016-05-13 MED ORDER — WHITE PETROLATUM GEL
Status: AC
  Administered 2016-05-13: 1
  Filled 2016-05-13: qty 1

## 2016-05-13 MED ORDER — FAMOTIDINE 40 MG/5ML PO SUSR
20.0000 mg | Freq: Every day | ORAL | Status: DC
  Administered 2016-05-14 – 2016-05-15 (×2): 20 mg
  Filled 2016-05-13 (×3): qty 2.5

## 2016-05-13 NOTE — Progress Notes (Signed)
Name: Tara Cruz MRN: [id number] DOB: 1951/02/16    ADMISSION DATE:  05/04/2016   CHIEF COMPLAINT:  Shortness of Breath  Brief Hx: 65 year old female w/ prior C4-C7 ACDF back in June 2017. 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 Since this time has had progressive ascending weakness, weight loss and recurrent acute on chronic hypercarbia. She was actually referred to Dr Christene Lye (neuro surg at [facility] by Dr Shon Baton but did not get to the appointment). Intubated 12/6, tube feedings started 12/6. Started Keflex 12/7 for ecoli UTI. Accepted at [facility] by Dr Clelia Croft and still  awaiting bed assignment.    SUBJECTIVE/OVERNIGHT/INTERVAL HX No acute change overnight.    VITAL SIGNS: Temp:  [98.1 F (36.7 C)-100 F (37.8 C)] 98.5 F (36.9 C) (12/14 0803) Pulse Rate:  [70-102] 70 (12/14 0829) Resp:  [14-24] 15 (12/14 0829) BP: (83-156)/(50-98) 114/59 (12/14 0829) SpO2:  [82 %-97 %] 92 % (12/14 0832) FiO2 (%):  [40 %-60 %] 60 % (12/14 0832) Weight:  [55.5 kg (122 lb 5.7 oz)] 55.5 kg (122 lb 5.7 oz) (12/14 0453)  PHYSICAL EXAMINATION: General:  Eyes open. Comfortable. Weak.  NAD  Integument:  Warm & dry. No rash on exposed skin.  HEENT:  Endotracheal tube in place. No scleral injection or icterus. Cardiovascular:  Regular rate. Trace edema. No appreciable JVD.  Pulmonary:  resps even non labored on vent, diminished bases, otherwise clear.  Abdomen: Soft. Normal bowel sounds. Nondistended. Nontender. Neurological: nods appropriately, Continues to have significant lower extremity weakness. Can wiggle toes faintly.   PULMONARY No results for input(s): PHART, PCO2ART, PO2ART, HCO3, TCO2, O2SAT in the last 168 hours.  Invalid input(s): PCO2, PO2  CBC  Recent Labs Lab 05/11/16 0254 05/12/16 0704 05/13/16 0510  HGB 12.4 11.4* 10.8*  HCT 38.6 35.5* 33.9*  WBC 17.6* 14.2* 14.6*  PLT 252 265  310    COAGULATION No results for input(s): INR in the last 168 hours.  CARDIAC    Recent Labs Lab 05/11/16 1026 05/11/16 1816 05/12/16 0002 05/12/16 0704 05/12/16 1026  TROPONINI 0.03* 0.03* 0.05* <0.03 0.03*   No results for input(s): PROBNP in the last 168 hours.   CHEMISTRY  Recent Labs Lab 05/06/16 1626  05/08/16 0158 05/10/16 0305 05/10/16 2133 05/11/16 0254 05/12/16 0704 05/13/16 0510  NA  --   --  138 136 136  --  136 138  K  --   < > 4.0 4.3 4.5  --  3.7 3.5  CL  --   --  96* 95* 92*  --  93* 92*  CO2  --   --  36* 34* 37*  --  35* 38*  GLUCOSE  --   --  124* 142* 125*  --  131* 136*  BUN  --   --  15 21* 20  --  18 23*  CREATININE  --   --  <0.30* <0.30* 0.30*  --  <0.30* <0.30*  CALCIUM  --   --  9.2 9.3 9.6  --  9.4 9.1  MG 1.8  --   --   --   --  2.1 2.2 2.2  PHOS 4.1  --   --   --   --  2.6 3.0 2.9  < > = values in this interval not displayed. CrCl cannot be calculated (This lab value cannot be used to calculate CrCl because it  is not a number: <0.30).   LIVER No results for input(s): AST, ALT, ALKPHOS, BILITOT, PROT, ALBUMIN, INR in the last 168 hours.   INFECTIOUS No results for input(s): LATICACIDVEN, PROCALCITON in the last 168 hours.   ENDOCRINE CBG (last 3)   Recent Labs  05/12/16 2333 05/13/16 0325 05/13/16 0801  GLUCAP 127* 142* 134*       IMAGING x48h   Dg Chest Port 1 View  Result Date: 05/13/2016 CLINICAL DATA:  Followup ventilator support. EXAM: PORTABLE CHEST 1 VIEW COMPARISON:  05/12/2016 FINDINGS: Endotracheal tube tip is 4 cm above the carina. Nasogastric tube enters stomach, doubling back with its tip in the region of the proximal stomach. Right arm PICC tip in the SVC 5 cm above the right atrium. Bilateral lower lobe bronchopneumonia persists. Radiographic findings are worsening in the right lower lung. Slightly less collapse in the left lower lobe which is also involved by pneumonia. IMPRESSION: Bilateral lower  lobe bronchopneumonia, radiographically worsening on the right. Less volume loss in the left lower lobe. Electronically Signed   By: Paulina FusiMark  Shogry M.D.   On: 05/13/2016 07:11   Dg Chest Port 1 View  Result Date: 05/12/2016 CLINICAL DATA:  Respiratory failure.  Hypoxia. EXAM: PORTABLE CHEST 1 VIEW COMPARISON:  05/11/2016. FINDINGS: Endotracheal tube and NG tube in stable position. Heart size normal. Lobe infiltrate. Persistent bibasilar atelectasis. Mild infiltrate right lung base cannot be excluded on today's exam. No pleural effusion or pneumothorax. Prior cervical spine fusion. IMPRESSION: 1.  Slight improvement of left lower lobe infiltrate. 2.Mild new infiltrate right lung base cannot be excluded on today's exam. Bibasilar atelectasis. Electronically Signed   By: Maisie Fushomas  Register   On: 05/12/2016 06:52   Dg Chest Port 1 View  Result Date: 05/11/2016 CLINICAL DATA:  65 year old female with increased difficulty breathing despite intubation. Query ET tube position. Initial encounter. EXAM: PORTABLE CHEST 1 VIEW COMPARISON:  05/05/2016 and earlier. FINDINGS: Portable AP semi upright view at 1230 hours. Endotracheal tube tip position appears adequate at the level of the clavicles. Superimposed enteric tube now coursing to the abdomen with side hole in the left upper quadrant, and cervical ACDF hardware. New confluent retrocardiac and left lung base opacity obscuring the left hemidiaphragm. Mediastinal contours appear stable. The right lung remains clear. No pneumothorax or pulmonary edema. Possible small left pleural effusion. IMPRESSION: 1. Left lower lobe consolidation new from prior study and compatible with aspiration and/or pneumonia. 2. Endotracheal tube tip position appears adequate at the level the clavicles. 3. Enteric tube now in place, looped in the stomach. Electronically Signed   By: Odessa FlemingH  Hall M.D.   On: 05/11/2016 12:47    STUDIES & EVENTS 10/21/15 EMG/NCS [Dr. 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 Considerable artifact from the fusion hardware. 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 Abnormal T2 signal in the cord immediately distal to that consistent with gliosis. - Facet arthropathy at C2-3 and C3-4 with neural foraminal stenosis that could be symptomatic. 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 Canal diameter 4 mm. Increasingly prominent 5  x 3 x 15 mm T2 hyperintense fluid collection dorsal to but within the surgical construct, uncertain significance.    SIGNIFICANT EVENTS  12/5 admitted [facility]; [facility].  12/6 electively intubated. Tube feeds started.  12/10 -   Waiting for [facility] bed.  No sig change.  Agitated.  Tol PS wean 10/5 x 4 hours.  Got lethargic, hypoxic.  12/12 FOB   MICRO:  Urine 12/5>>> EColi  BC x 2 12/12>>> BAL 12/12>>>  ABX:  Vanc 12/12>>> Zosyn 12/12>>>  ASSESSMENT / PLAN:  Pulmonary A: Acute on chronic hypercarbic respiratory failure in setting of neuro-muscular weakness with underlying COPD FOB 12/12>> P: Full vent support Wean as able  VAP protocol  Intermittent abg/PCXR PAD protocol  BD's (spiriva, albuterol at home) Follow FOB cultures  abx as above   Cardiology A: Hypertension, Hyperlipidemia  CAD Troponin elevation resolved P: Continuous telemetry SCDs Strict I&O  Neurological A: Encephalopathy in setting of hypercarbic respiratory failure Progressive neuro-muscular weakness. Known residual C6 cord  compression from bone spur has been followed by local neurology who note "neuropathy labs negative".  - awaiting [facility] bed for evaluation  P: Supportive care will defer from further neurological testing as there is no need to duplicate what [facility] Fentanyl pushes prn  GI: A: Protein calorie malnutrition TF at goal P: Cont tube feeds PPI for SUP Monitor BM's  Renal: A: No acute  P: Trend lytes/replace as needed  Monitor urine output  HEME A: No acute P: Watchung heparin  Monitor for bleeding  Infectious disease  A: UTI (Ecoli) Elevated WBC Low grade temp P: abx as above    Endocrine  Mild hyperglycemia  P: ssi   Musculoskeletal A: Foot drop   P: Podus boot (rotate q shift)  No family at bedside 12/14.  Awaiting [facility] bed -- accepted but no beds available.    Dirk DressKaty Breona Cherubin, NP 05/13/2016  10:23 AM Pager: [phone] or [phone]

## 2016-05-13 NOTE — Progress Notes (Signed)
Patient ETT advanced 2cm per MD order.  Patient tolerated well. 

## 2016-05-13 NOTE — Progress Notes (Signed)
Neurology; Came to evaluate patient but just received a dose of Fentanyl and is currently heavily sedated and unable to follow commands and participate on neuro-exam. Will assess when she becomes more awake.  Georga Hackingsvaldo Camilo,MD

## 2016-05-14 ENCOUNTER — Inpatient Hospital Stay (HOSPITAL_COMMUNITY): Payer: Medicare Other

## 2016-05-14 LAB — CBC WITH DIFFERENTIAL/PLATELET
BASOS ABS: 0 10*3/uL (ref 0.0–0.1)
BASOS PCT: 0 %
EOS PCT: 0 %
Eosinophils Absolute: 0 10*3/uL (ref 0.0–0.7)
HEMATOCRIT: 32 % — AB (ref 36.0–46.0)
Hemoglobin: 10 g/dL — ABNORMAL LOW (ref 12.0–15.0)
LYMPHS PCT: 3 %
Lymphs Abs: 0.6 10*3/uL — ABNORMAL LOW (ref 0.7–4.0)
MCH: 30.1 pg (ref 26.0–34.0)
MCHC: 31.3 g/dL (ref 30.0–36.0)
MCV: 96.4 fL (ref 78.0–100.0)
MONO ABS: 1.8 10*3/uL — AB (ref 0.1–1.0)
MONOS PCT: 10 %
NEUTROS ABS: 14.8 10*3/uL — AB (ref 1.7–7.7)
Neutrophils Relative %: 87 %
PLATELETS: 349 10*3/uL (ref 150–400)
RBC: 3.32 MIL/uL — ABNORMAL LOW (ref 3.87–5.11)
RDW: 13.9 % (ref 11.5–15.5)
WBC: 17.2 10*3/uL — ABNORMAL HIGH (ref 4.0–10.5)

## 2016-05-14 LAB — GLUCOSE, CAPILLARY
GLUCOSE-CAPILLARY: 153 mg/dL — AB (ref 65–99)
GLUCOSE-CAPILLARY: 114 mg/dL — AB (ref 65–99)
Glucose-Capillary: 123 mg/dL — ABNORMAL HIGH (ref 65–99)
Glucose-Capillary: 133 mg/dL — ABNORMAL HIGH (ref 65–99)
Glucose-Capillary: 134 mg/dL — ABNORMAL HIGH (ref 65–99)
Glucose-Capillary: 137 mg/dL — ABNORMAL HIGH (ref 65–99)
Glucose-Capillary: 152 mg/dL — ABNORMAL HIGH (ref 65–99)

## 2016-05-14 LAB — PHOSPHORUS: Phosphorus: 2.7 mg/dL (ref 2.5–4.6)

## 2016-05-14 LAB — MAGNESIUM: Magnesium: 2.2 mg/dL (ref 1.7–2.4)

## 2016-05-14 MED ORDER — MIDAZOLAM HCL 2 MG/2ML IJ SOLN
INTRAMUSCULAR | Status: AC
  Administered 2016-05-14: 1 mg
  Filled 2016-05-14: qty 6

## 2016-05-14 MED ORDER — BISACODYL 10 MG RE SUPP
10.0000 mg | Freq: Once | RECTAL | Status: AC
  Administered 2016-05-14: 10 mg via RECTAL
  Filled 2016-05-14: qty 1

## 2016-05-14 MED ORDER — FENTANYL CITRATE (PF) 100 MCG/2ML IJ SOLN
INTRAMUSCULAR | Status: AC
  Administered 2016-05-14: 50 ug
  Filled 2016-05-14: qty 2

## 2016-05-14 MED ORDER — VANCOMYCIN HCL 500 MG IV SOLR
500.0000 mg | Freq: Two times a day (BID) | INTRAVENOUS | Status: DC
  Administered 2016-05-14 – 2016-05-16 (×5): 500 mg via INTRAVENOUS
  Filled 2016-05-14 (×6): qty 500

## 2016-05-14 NOTE — Progress Notes (Signed)
NEURO HOSPITALIST PROGRESS NOTE   SUBJECTIVE:                                                                                                                        Asked to reevaluate patient regarding progressive weakness upper and lower extremities.  Patient was previously consulted by Dr Lucia GaskinsAhern and I am attaching a copy of her detailed evaluation. Tara Cruz is intubated on the vent, but alert and awake and able to follow commands. When asked, she confirms that her weakness has been progressive and that she had prior cervical spine surgery. At this moment, she denies having HA, dizziness, or visual disturbances. Last MRI cervical spine done 11/18  revealed severe cervical cord compression at the C6-7 level s/p cervical discectomy C4-5, C6 corpectomy and fusion C4-7. cervical discectomy C4-5, C6 corpectomy and fusion C4-7. MRI brain and thoracic spine revealed no acute abnormality.  " Tara Cruz is a 65 y.o. female here as a referral from Dr. Venita Lickahari Brooks for progressive weakness. Per chart review: PMHx of CAD, RBBB, HTN, COPD, cervical myelopathy. She has a history of progressive LE weakness, gait and imbalance, foot drop and underwent lumbar decompression surgery in 12/2014, Feb 2017 she developed left foot weakness and had a fall in 08/2015 with progressive weakness and gait abnormality as well as right hand weakness and and paresthesias in the hands. 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 cervical discectomy C4-5, C6 corpectomy and fusion C4-7. Patient admitted on the 16th for CP, COPD, NSTEMI and  and continues to c/o worsening weakness likely secondary to cervical myelopathy. Since the surgery she has had second opinions from multiple spine surgeons and a local neurologist to  help explain her advancing weakness. Cardiology suspected elevated troponins more related to acute respiratory event with severe coughing and mucous plugs. Treatment for COPD inpatient with improvement.  Per patient, no family at bedside: Patient says she got "choked" at home and was coughing and came to the emergency and was admitted. She feels better with COPD treatment here inpatient but still with SOB. Her breathing is improving. No coughing. She continues to have shortness of breath which started several months ago, improved since being here. She does not use O2 at home. She feel she has worsened weakness, the right hand was never affected and feels it affected more, more weakness in the right arm. No new numbness or tingling. Legs and arm getting weaker. No double vision, no ptosis, no vision changes, no dysphagia, no facial weakness, no changes in speech. Generalized slowly progressive weakness not increased moreso in the last few days, slowly progressive over months. She reports muscle  spasms in the thighs. She reports muscle twitching in the arms and legs. Patient denies acute weakness moreso in the last few days, she reports slowly progressive weakness over months".   OBJECTIVE:                                                                                                                           Vital signs in last 24 hours: Temp:  [97.5 F (36.4 C)-99.3 F (37.4 C)] 98 F (36.7 C) (12/15 1143) Pulse Rate:  [82-100] 89 (12/15 1300) Resp:  [15-26] 23 (12/15 1300) BP: (76-147)/(45-111) 121/72 (12/15 1300) SpO2:  [86 %-100 %] 86 % (12/15 1300) FiO2 (%):  [50 %-60 %] 50 % (12/15 1329) Weight:  [55.8 kg (123 lb 0.3 oz)] 55.8 kg (123 lb 0.3 oz) (12/15 0500)  Intake/Output from previous day: 12/14 0701 - 12/15 0700 In: 1682.5 [I.V.:190; NG/GT:1380; IV Piggyback:112.5] Out: 1000 [Urine:1000] Intake/Output this shift: Total I/O In: 247.5 [NG/GT:210; IV Piggyback:37.5] Out: 445  [Urine:445] Nutritional status: Diet NPO time specified  Past Medical History:  Diagnosis Date  . Abnormal stress test 01/03/2013   false positive by Dr. Allyson Sabal  . Allergy   . Arthritis    degenerative lumbar spine   . Asthma   . CAD (coronary artery disease) 01/30/2013   single vessel disease of small second diagonal branch, normal EF 65%  . COPD (chronic obstructive pulmonary disease) (HCC)    reviewed by Dr. Sherene Sires  . Depression   . Emphysema lung (HCC)   . Fall 01/13/2016  . H/O cardiovascular stress test 2014  . Hyperlipidemia   . Hypertension   . Neuropathy (HCC)   . Right bundle branch block   . Tobacco use    has tried to stop numerous times    Neurologic ROS: unable to obtain due to mental status   Neurologic Exam:  General: NAD Mental Status: Intubated on the vent. Able to follow 3 step commands without difficulty. Cranial Nerves: II:  Visual fields grossly normal, pupils equal, round, reactive to light and accommodation III,IV, VI: ptosis not present, extra-ocular motions intact bilaterally V,VII: smile symmetric, facial light touch sensation normal bilaterally VIII: hearing normal bilaterally IX,X: intubated XI: bilateral shoulder shrug XII: midline tongue extension without atrophy or fasciculations Motor: 1/5 weakness bilateral upper and lower extremities bilateral but poor effort. Tone and bulk:normal tone throughout; no atrophy noted Sensory: Pinprick and light touch intact at the feet Deep Tendon Reflexes:  Brisk in the upper extremities; 1+ knee jerks Plantars: No tested Cerebellar: Unable to test due to weakness Gait:  Unable to test due to clinical condition    Lab Results: Lab Results  Component Value Date/Time   CHOL 163 01/31/2015 11:07 AM   Lipid Panel No results for input(s): CHOL, TRIG, HDL, CHOLHDL, VLDL, LDLCALC in the last 72 hours.  Studies/Results: Dg Chest Port 1 View  Result Date: 05/14/2016 CLINICAL DATA:  Acute on chronic  respiratory failure. EXAM: PORTABLE CHEST  1 VIEW COMPARISON:  Radiograph of May 13, 2016. FINDINGS: The heart size and mediastinal contours are within normal limits. Endotracheal and nasogastric tubes are unchanged in position. Right-sided PICC line is unchanged. No pneumothorax or pleural effusion is noted. Stable bilateral lower lobe opacities are noted concerning for pneumonia. The visualized skeletal structures are unremarkable. IMPRESSION: Stable support apparatus. Stable bilateral lower lobe opacities as described above. Electronically Signed   By: Lupita RaiderJames  Green Jr, M.D.   On: 05/14/2016 11:36   Dg Chest Port 1 View  Result Date: 05/13/2016 CLINICAL DATA:  Followup ventilator support. EXAM: PORTABLE CHEST 1 VIEW COMPARISON:  05/12/2016 FINDINGS: Endotracheal tube tip is 4 cm above the carina. Nasogastric tube enters stomach, doubling back with its tip in the region of the proximal stomach. Right arm PICC tip in the SVC 5 cm above the right atrium. Bilateral lower lobe bronchopneumonia persists. Radiographic findings are worsening in the right lower lung. Slightly less collapse in the left lower lobe which is also involved by pneumonia. IMPRESSION: Bilateral lower lobe bronchopneumonia, radiographically worsening on the right. Less volume loss in the left lower lobe. Electronically Signed   By: Paulina FusiMark  Shogry M.D.   On: 05/13/2016 07:11    MEDICATIONS                                                                                                                       I have reviewed the patient's current medications.  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 She has been  [facility] but [facility] still unavailable. Will reimage her cervical spine and brain but I am afraid at this point she may not be a suitable surgical candidate. Repeat EMG/NCS is warranted to look for other potential etiology like motor neuron disease but unable to do in the hospital. Will follow up after MRI.  Wyatt Portelasvaldo Audreanna Torrisi, MD [facility] [phone]  05/14/2016, 3:18 PM

## 2016-05-14 NOTE — Progress Notes (Signed)
RT NOTE:  Recruitment preformed through vent for Sats @ 89%. Pt suctioned/lavaged w/o improvement. Following recruitment Sats increased to 98%. Pt setting currently @ 12 peep/ 100% FiO2 from previous change in MRI.

## 2016-05-14 NOTE — Progress Notes (Signed)
Name: Tara Cruz MRN: [id number] DOB: 1950-06-28    ADMISSION DATE:  05/04/2016   CHIEF COMPLAINT:  Shortness of Breath  Brief Hx: 65 year old female w/ prior C4-C7 ACDF back in June 2017. 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 Since this time has had progressive ascending weakness, weight loss and recurrent acute on chronic hypercarbia. She was actually referred to Dr Christene LyeKarikari ([facility] by Dr Shon BatonBrooks but did not get to the appointment). Intubated 12/6, tube feedings started 12/6. Started Keflex 12/7 for ecoli UTI. [facility] by Dr Clelia CroftShaw and awaiting bed assignment.    STUDIES & EVENTS 10/21/15 EMG/NCS [Dr. 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 Considerable artifact from the fusion hardware. 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 Abnormal T2 signal in the cord immediately distal to that consistent with gliosis. - Facet arthropathy at C2-3 and C3-4 with neural foraminal stenosis that could be symptomatic. 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 Canal diameter 4 mm. Increasingly prominent 5 x 3 x 15 mm T2 hyperintense fluid collection dorsal to but within the surgical construct, uncertain significance.    SIGNIFICANT EVENTS  [facility]; [facility].  E Colii unirine at admit 12/6 electively intubated. Tube feeds started.  12/10 -   Waiting for [facility] bed.   No sig change.  Agitated.  Tol PS wean 10/5 x 4 hours.  Got lethargic, hypoxic.  12/12 FOB for new acute LLL consolidation/collapse. STart vanc/zosyn 12/14- No acute change overnight. Stopped vanc   SUBJECTIVE/OVERNIGHT/INTERVAL HX 12/15- foley leaking. No BM per rN. MRI repeat - of C Spine -ordered by neuro.  [facility]. Per RN female signficant other comes in evening.  Trach asporate 12/12 growing  staph  VITAL SIGNS: Temp:  [97.5 F (36.4 C)-99.3 F (37.4 C)] 99.3 F (37.4 C) (12/15 0810) Pulse Rate:  [25-129] 87 (12/15 1000) Resp:  [15-26] 17 (12/15 1000) BP: (62-147)/(45-111) 111/61 (12/15 1000) SpO2:  [75 %-100 %] 90 % (12/15 1000) FiO2 (%):  [50 %-60 %] 50 % (12/15 0800) Weight:  [55.8 kg (123 lb 0.3 oz)] 55.8 kg (123 lb 0.3 oz) (12/15 0500)  PHYSICAL EXAMINATION: General:  Eyes open. Comfortable. Weak.  NAD  Integument:  Warm & dry. No rash on exposed skin.  HEENT:  Endotracheal tube in place. No scleral injection or icterus. Cardiovascular:  Regular rate. Trace edema. No appreciable JVD.  Pulmonary:  resps even non labored on vent, diminished bases, otherwise clear.  Abdomen: Soft. Normal bowel sounds. Nondistended. Nontender. Neurological: nods appropriately, Continues to have significant lower extremity weakness. Can wiggle toes faintly.   PULMONARY No results for input(s): PHART, PCO2ART, PO2ART, HCO3, TCO2, O2SAT in the last 168 hours.  Invalid input(s): PCO2, PO2  CBC  Recent Labs Lab 05/12/16 0704 05/13/16 0510 05/14/16 0500  HGB 11.4* 10.8* 10.0*  HCT 35.5* 33.9* 32.0*  WBC 14.2* 14.6* 17.2*  PLT 265 310  349    COAGULATION No results for input(s): INR in the last 168 hours.  CARDIAC    Recent Labs Lab 05/11/16 1026 05/11/16 1816 05/12/16 0002 05/12/16 0704 05/12/16 1026  TROPONINI 0.03* 0.03* 0.05* <0.03 0.03*   No results for input(s): PROBNP in the last 168 hours.   CHEMISTRY  Recent Labs Lab  05/08/16 0158 05/10/16 0305 05/10/16 2133 05/11/16 0254 05/12/16 0704 05/13/16 0510 05/14/16 0500  NA 138 136 136  --  136 138  --   K 4.0 4.3 4.5  --  3.7 3.5  --   CL 96* 95* 92*  --  93* 92*  --   CO2 36* 34* 37*  --  35* 38*  --   GLUCOSE 124* 142* 125*  --  131* 136*  --   BUN 15 21* 20  --  18 23*  --   CREATININE <0.30* <0.30* 0.30*  --  <0.30* <0.30*  --   CALCIUM 9.2 9.3 9.6  --  9.4 9.1  --   MG  --   --   --  2.1 2.2 2.2 2.2  PHOS  --   --   --  2.6 3.0 2.9 2.7   CrCl cannot be calculated (This lab value cannot be used to calculate CrCl because it is not a number: <0.30).   LIVER No results for input(s): AST, ALT, ALKPHOS, BILITOT, PROT, ALBUMIN, INR in the last 168 hours.   INFECTIOUS No results for input(s): LATICACIDVEN, PROCALCITON in the last 168 hours.   ENDOCRINE CBG (last 3)   Recent Labs  05/14/16 0001 05/14/16 0342 05/14/16 0808  GLUCAP 133* 137* 153*       IMAGING x48h   Dg Chest Port 1 View  Result Date: 05/13/2016 CLINICAL DATA:  Followup ventilator support. EXAM: PORTABLE CHEST 1 VIEW COMPARISON:  05/12/2016 FINDINGS: Endotracheal tube tip is 4 cm above the carina. Nasogastric tube enters stomach, doubling back with its tip in the region of the proximal stomach. Right arm PICC tip in the SVC 5 cm above the right atrium. Bilateral lower lobe bronchopneumonia persists. Radiographic findings are worsening in the right lower lung. Slightly less collapse in the left lower lobe which is also involved by pneumonia. IMPRESSION: Bilateral lower lobe bronchopneumonia, radiographically worsening on the right. Less volume loss in the left lower lobe. Electronically Signed   By: Paulina FusiMark  Shogry M.D.   On: 05/13/2016 07:11    ASSESSMENT / PLAN:  Pulmonary A: Acute on chronic hypercarbic respiratory failure in setting of neuro-muscular weakness with underlying COPD FOB 12/12>> few staph   - no change P: Full vent support Wean as able; SBT attempt  05/14/16  Might need trach week of 05/16/16 if still not bed at [facility] VAP protocol  Intermittent abg/PCXR PAD protocol  BD's (spiriva, albuterol at home)abx as above   Cardiology A: Hypertension, Hyperlipidemia  CAD -normal ECHO Nov 2017   - Troponin very mild elevation resolved P: Continuous telemetry SCDs Strict I&O  Neurological A: Encephalopathy in setting of hypercarbic respiratory failure Progressive neuro-muscular weakness. 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   - awaiting [facility] bed for evaluation - neuro consult 05/14/16 gettting [facility]  P: Supportive care Neuro consult input appreciate - might need to involve Dr Shon BatonBrooks v NSGY locally Given delalys with transfer - best to activate [facility] and continue full workp and  treatment (10 days and still not bed at [facility])  GI: A: Protein calorie malnutrition TF at goal   -constipated  P: Try dulcolax suppository + continue other laxative Cont tube feeds PPI for SUP Monitor BM's  Renal: A: No acute  P: Trend lytes/replace as needed  Monitor urine output  HEME A: No acute P: [place] heparin  Monitor for bleeding  Infectious disease   MICRO:  Urine 12/5>>> EColi  BC x 2 12/12>>> BAL 12/12>>>few staph    A: UTI (Ecoli) @ admit 05/04/2016  LLL VAP/collapse consolidation 05/11/16 - growing few staph 05/14/16    P: ABX:  Vanc 12/12>>>12/14, 12/15 >> Zosyn 12/12>>>   Endocrine  Mild hyperglycemia  P: ssi   Musculoskeletal A: Foot drop   P: Podus boot (rotate q shift)   FAMILY No family at bedside 12/14, 12/15 - (per Rn husband visits late evenings).  Awaiting [facility] bed -- accepted but no beds available.    GLOBAL - No [facility] bed since admission. Will get MRI here perneuro and per that decide further neuro v ortho/nsgy workup. Continue full vent support. Try SBT. Might  need trach here week of 05/16/16    The patient is critically ill with multiple organ systems failure and requires high complexity decision making for assessment and support, frequent evaluation and titration of therapies, application of advanced monitoring technologies and extensive interpretation of multiple databases.   Critical Care Time devoted to patient care services described in this note is  30  Minutes. This time reflects time of care of this signee Dr Kalman Shan. This critical care time does not reflect procedure time, or teaching time or supervisory time of PA/NP/Med student/Med Resident etc but could involve care discussion time    Dr. Kalman Shan, M.D., [facility].C.P Pulmonary and Critical Care Medicine Staff Physician [facility] Pager: [phone], If no answer or between  15:00h - 7:00h: call [phone]  05/14/2016 10:51 AM

## 2016-05-14 NOTE — Progress Notes (Signed)
Pharmacy Antibiotic Note  Tara Cruz is a 65 y.o. female admitted on 05/04/2016 wLedora Bottcherith pneumonia.  Pharmacy has been consulted for Vancomycin and Zosyn dosing. Vancomycin was stopped 12/14. Trach aspirate now growing staph aureus- unknown sensitivities. WBC up today. Will restart vancomycin until sensitivities are back. SCr remains low - possibly falsely low in setting of low muscle mass. UOP is good at 0.7 cc/kg/hr.   Plan: Vancomycin 500mg   IV every 12 hours.  Goal trough 15-20 mcg/mL. Zosyn 3.375g IV q8h (4 hour infusion).  Monitor renal function closely with low SCr - consider vancomycin trough soon to rule out accumulation.  Follow-up culture speciation and sensitivities to help narrow therapy.   Height: 5\' 4"  (162.6 cm) Weight: 123 lb 0.3 oz (55.8 kg) IBW/kg (Calculated) : 54.7  Temp (24hrs), Avg:98.2 F (36.8 C), Min:97.5 F (36.4 C), Max:99.3 F (37.4 C)   Recent Labs Lab 05/08/16 0158 05/10/16 0305 05/10/16 2133 05/11/16 0254 05/12/16 0704 05/13/16 0510 05/14/16 0500  WBC  --  15.0*  --  17.6* 14.2* 14.6* 17.2*  CREATININE <0.30* <0.30* 0.30*  --  <0.30* <0.30*  --     CrCl cannot be calculated (This lab value cannot be used to calculate CrCl because it is not a number: <0.30).    No Known Allergies  Antimicrobials this admission: Vancomycin 12/12 >>12/14; restarted 12/15 until sens >> Zosyn 12/12 >> Keflex 12/7 >>12/12  Dose adjustments this admission: none  Microbiology results: 12/12BCx:  12/5UCx: Ecoli (Tx Keflex) 12/12 Sputum: few staph aureus 12/5MRSA PCR: negative  Thank you for allowing pharmacy to be a part of this patient's care.  Link SnufferJessica Karah Caruthers, PharmD, BCPS Clinical Pharmacist [phone] until 3:30 PM [phone] after hours 05/14/2016 10:59 AM

## 2016-05-14 NOTE — Progress Notes (Signed)
Patient ETT holder changed, with assistance of another RT, without complications.

## 2016-05-14 NOTE — Progress Notes (Addendum)
Pt having periods of desaturation while in MRI. SpO2 dropped as low as 75% on 100%, Peep 10. Pt suctioned, SpO2 only went as high as 85%. Peep increased to 12cmH20. SpO2 currently 98%. RT will continue to monitor.

## 2016-05-14 NOTE — Progress Notes (Signed)
   05/14/16 1120  Clinical Encounter Type  Visited With Patient  Visit Type Follow-up  Spiritual Encounters  Spiritual Needs Emotional  Stress Factors  Patient Stress Factors Health changes  Discussed with Pt on readiness to complete Advance Directive. Pt nodded in the negative feeling fatigued.

## 2016-05-15 ENCOUNTER — Inpatient Hospital Stay (HOSPITAL_COMMUNITY): Payer: Medicare Other

## 2016-05-15 DIAGNOSIS — J9621 Acute and chronic respiratory failure with hypoxia: Secondary | ICD-10-CM

## 2016-05-15 LAB — CBC WITH DIFFERENTIAL/PLATELET
Basophils Absolute: 0 10*3/uL (ref 0.0–0.1)
Basophils Relative: 0 %
EOS PCT: 0 %
Eosinophils Absolute: 0 10*3/uL (ref 0.0–0.7)
HCT: 30.9 % — ABNORMAL LOW (ref 36.0–46.0)
Hemoglobin: 9.7 g/dL — ABNORMAL LOW (ref 12.0–15.0)
LYMPHS ABS: 0.7 10*3/uL (ref 0.7–4.0)
Lymphocytes Relative: 5 %
MCH: 30.7 pg (ref 26.0–34.0)
MCHC: 31.4 g/dL (ref 30.0–36.0)
MCV: 97.8 fL (ref 78.0–100.0)
MONO ABS: 1.1 10*3/uL — AB (ref 0.1–1.0)
MONOS PCT: 8 %
Neutro Abs: 11.7 10*3/uL — ABNORMAL HIGH (ref 1.7–7.7)
Neutrophils Relative %: 87 %
PLATELETS: 375 10*3/uL (ref 150–400)
RBC: 3.16 MIL/uL — ABNORMAL LOW (ref 3.87–5.11)
RDW: 14.2 % (ref 11.5–15.5)
WBC: 13.5 10*3/uL — ABNORMAL HIGH (ref 4.0–10.5)

## 2016-05-15 LAB — GLUCOSE, CAPILLARY
GLUCOSE-CAPILLARY: 110 mg/dL — AB (ref 65–99)
Glucose-Capillary: 127 mg/dL — ABNORMAL HIGH (ref 65–99)
Glucose-Capillary: 131 mg/dL — ABNORMAL HIGH (ref 65–99)
Glucose-Capillary: 136 mg/dL — ABNORMAL HIGH (ref 65–99)
Glucose-Capillary: 116 mg/dL — ABNORMAL HIGH (ref 65–99)

## 2016-05-15 LAB — CULTURE, RESPIRATORY W GRAM STAIN

## 2016-05-15 LAB — BASIC METABOLIC PANEL
Anion gap: 9 (ref 5–15)
BUN: 29 mg/dL — AB (ref 6–20)
CHLORIDE: 94 mmol/L — AB (ref 101–111)
CO2: 38 mmol/L — ABNORMAL HIGH (ref 22–32)
Calcium: 9.6 mg/dL (ref 8.9–10.3)
Creatinine, Ser: 0.3 mg/dL — ABNORMAL LOW (ref 0.44–1.00)
GFR calc Af Amer: >60 mL/min (ref >60)
GLUCOSE: 121 mg/dL — AB (ref 65–99)
Potassium: 4 mmol/L (ref 3.5–5.1)
Sodium: 141 mmol/L (ref 135–145)

## 2016-05-15 LAB — MAGNESIUM: Magnesium: 2.3 mg/dL (ref 1.7–2.4)

## 2016-05-15 LAB — PHOSPHORUS: Phosphorus: 2.8 mg/dL (ref 2.5–4.6)

## 2016-05-15 MED ORDER — SODIUM CHLORIDE 0.9 % IV SOLN
3.0000 g | Freq: Three times a day (TID) | INTRAVENOUS | Status: DC
  Administered 2016-05-15 – 2016-05-16 (×4): 3 g via INTRAVENOUS
  Filled 2016-05-15 (×5): qty 3

## 2016-05-15 MED ORDER — POLYETHYLENE GLYCOL 3350 17 G PO PACK
17.0000 g | PACK | Freq: Every day | ORAL | Status: DC
  Administered 2016-05-15 – 2016-05-16 (×2): 17 g via ORAL
  Filled 2016-05-15 (×2): qty 1

## 2016-05-15 NOTE — Progress Notes (Signed)
Name: Tara Cruz MRN: [id number] DOB: 1951/03/01    ADMISSION DATE:  05/04/2016   CHIEF COMPLAINT:  Shortness of Breath  Brief Hx: 65 year old female w/ prior C4-C7 ACDF back in June 2017. 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 Since this time has had progressive ascending weakness, weight loss and recurrent acute on chronic hypercarbia. She was actually referred to Dr Christene Lye (neuro surg at [facility] by Dr Shon Baton but did not get to the appointment). Intubated 12/6, tube feedings started 12/6. Started Keflex 12/7 for ecoli UTI. Accepted at [facility] by Dr Clelia Croft and awaiting bed assignment.  STUDIES & EVENTS 10/21/15 EMG/NCS [Dr. 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 Considerable artifact from the fusion hardware. 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 Abnormal T2 signal in the cord immediately distal to that consistent with gliosis. - Facet arthropathy at C2-3 and C3-4 with neural foraminal stenosis that could be symptomatic. 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 Canal diameter 4 mm. Increasingly prominent 5 x 3 x 15 mm T2 hyperintense fluid collection dorsal to but within the surgical construct, uncertain significance.  12/15- foley leaking. No BM per rN. MRI repeat - of C Spine -ordered by neuro.  Still no news from [facility] regarding bed. Per RN female signficant other comes in evening.  Trach asporate  12/12 growing  staph  SIGNIFICANT EVENTS  12/5 admitted [facility]; accepted at [facility] but no beds.  E Colii unirine at admit 12/6 electively intubated. Tube feeds started.  12/10 -   Waiting for [facility] bed.  No sig change.  Agitated.  Tol PS wean 10/5 x 4 hours.  Got lethargic, hypoxic.  12/12 FOB for new acute LLL consolidation/collapse. STart vanc/zosyn 12/14- No acute change overnight. Stopped vanc   SUBJECTIVE/OVERNIGHT/INTERVAL HX: Awake ON VENT   VITAL SIGNS: Temp:  [98 F (36.7 C)-98.7 F (37.1 C)] 98.6 F (37 C) (12/16 0822) Pulse Rate:  [81-98] 91 (12/16 0500) Resp:  [15-23] 22 (12/16 0500) BP: (77-128)/(49-76) 113/61 (12/16 0500) SpO2:  [82 %-100 %] 96 % (12/16 0830) FiO2 (%):  [50 %-100 %] 60 % (12/16 0830) Weight:  [56.7 kg (125 lb)] 56.7 kg (125 lb) (12/16 0446)  PHYSICAL EXAMINATION: General:  Eyes open. Comfortable. Weak.  NAD  Integument:  Warm & dry. No rash on exposed skin.  HEENT:  ett Cardiovascular: S1 S2 rrr Pulmonary:  RONCHI BASES Abdomen: Soft. Normal bowel sounds. Nondistended. Nontender. Neurological: nods appropriately, Continues to have significant lower extremity weakness. NO CHANGES  PULMONARY No results for input(s): PHART, PCO2ART, PO2ART, HCO3, TCO2, O2SAT in the last 168 hours.  Invalid input(s): PCO2, PO2  CBC  Recent Labs Lab 05/13/16 0510 05/14/16 0500 05/15/16 0435  HGB 10.8* 10.0* 9.7*  HCT 33.9* 32.0* 30.9*  WBC 14.6* 17.2* 13.5*  PLT 310 349 375    COAGULATION No results for input(s): INR in the last 168 hours.  CARDIAC    Recent Labs Lab 05/11/16 1026 05/11/16 1816 05/12/16 0002  05/12/16 0704 05/12/16 1026  TROPONINI 0.03* 0.03* 0.05* <0.03 0.03*   No results for input(s): PROBNP in the last 168 hours.   CHEMISTRY  Recent Labs Lab 05/10/16 0305 05/10/16 2133 05/11/16 0254 05/12/16 0704 05/13/16 0510 05/14/16 0500 05/15/16 0430  NA 136 136  --  136 138  --  141  K 4.3 4.5  --  3.7 3.5  --  4.0  CL 95*  92*  --  93* 92*  --  94*  CO2 34* 37*  --  35* 38*  --  38*  GLUCOSE 142* 125*  --  131* 136*  --  121*  BUN 21* 20  --  18 23*  --  29*  CREATININE <0.30* 0.30*  --  <0.30* <0.30*  --  0.30*  CALCIUM 9.3 9.6  --  9.4 9.1  --  9.6  MG  --   --  2.1 2.2 2.2 2.2 2.3  PHOS  --   --  2.6 3.0 2.9 2.7 2.8   Estimated Creatinine Clearance: 58.9 mL/min (by C-G formula based on SCr of 0.3 mg/dL (L)).   LIVER No results for input(s): AST, ALT, ALKPHOS, BILITOT, PROT, ALBUMIN, INR in the last 168 hours.   INFECTIOUS No results for input(s): LATICACIDVEN, PROCALCITON in the last 168 hours.   ENDOCRINE CBG (last 3)   Recent Labs  05/14/16 2336 05/15/16 0358 05/15/16 0825  GLUCAP 123* 127* 131*       IMAGING x48h   Mr Brain Wo Contrast  Result Date: 05/14/2016 CLINICAL DATA:  Progressive weakness EXAM: MRI HEAD WITHOUT CONTRAST MRI CERVICAL SPINE WITHOUT CONTRAST TECHNIQUE: Multiplanar, multiecho pulse sequences of the brain and surrounding structures, and cervical spine, to include the craniocervical junction and cervicothoracic junction, were obtained without intravenous contrast. COMPARISON:  Brain and cervical spine MRI 04/17/2016 FINDINGS: MRI HEAD FINDINGS Brain: There is a partially empty sella, unchanged. Midline structures are otherwise normal. No acute infarct or intraparenchymal hemorrhage. There is mild hyperintensity on the diffusion-weighted and T2-weighted images at the base of both precentral sulci and extending inferiorly along the corticospinal tracts to the level of the cerebral peduncles. This is a new finding compared to 04/17/2016. There is multifocal hyperintense T2-weighted signal within the periventricular white matter, most often seen in the setting of chronic microvascular ischemia. These areas are unchanged compared to the prior study. No mass lesion or midline shift. No hydrocephalus or extra-axial fluid collection. No age advanced or lobar predominant atrophy.  Cavum septum pellucidum et vergae. Vascular: Major intracranial arterial and venous sinus flow voids are preserved. No evidence of chronic microhemorrhage or amyloid angiopathy. Skull and upper cervical spine: The visualized skull base, calvarium, upper cervical spine and extracranial soft tissues are normal. Sinuses/Orbits: No fluid levels or advanced mucosal thickening. No mastoid effusion. Normal orbits. MRI CERVICAL SPINE FINDINGS Alignment: Alignment is unchanged with persistent straightening of the normal cervical lordosis. Vertebrae: There is ACDF extending from C4-C7 with C6 corpectomy. Unchanged appearance of small amount of fluid along the dorsal aspect of the fused levels. Cord: There is unchanged cord compression at the C6-7 level with associated hyperintense T2 weighted signal. Cord caliber is otherwise normal. No other focal cord signal abnormality. Posterior Fossa, vertebral arteries, paraspinal tissues: There is fluid layering within the nasopharynx, likely secondary to intubation. Disc levels: C1-C2: Normal. C2-C3: Mild right facet hypertrophy. Normal disc. No spinal canal stenosis. No neuroforaminal stenosis. C3-C4: Moderate bilateral facet hypertrophy. Normal disc. No spinal canal stenosis. No neuroforaminal stenosis.  C4-C5: Postfusion changes with small residual disc osteophyte complex. Mild spinal canal stenosis. Right facet hypertrophy contributes to mild right neuroforaminal stenosis. C5-C6: Right eccentric disc osteophyte complex and postfusion changes. Severe spinal canal stenosis with flattening of the spinal cord, unchanged. Severe right and moderate to severe left neuroforaminal stenosis. C6-C7: Large central disc osteophyte complex, unchanged. Cord compression with signal change again seen. Severe spinal canal stenosis. No neuroforaminal stenosis. C7-T1: Normal disc space and facet joints. No spinal canal stenosis. No neuroforaminal stenosis. The T1-T2 level is visualized on sagittal  images only. There is no significant disc herniation, spinal canal stenosis or neuroforaminal narrowing. IMPRESSION: 1. New areas of faint hyperintensity on T2- and diffusion-weighted imaging beginning at the base of the bilateral motor strips and extending along both corticospinal tracts to the level of the cerebral peduncles, new compared to 04/17/2016. This might be secondary to Wallerian degeneration related to the lower cervical spinal cord compression. However, these findings would also be consistent with amyotrophic lateral sclerosis (ALS), which is also a consideration. 2. Unchanged canal stenosis and cord compression at C6-7 and, to a lesser extent, C5-6 with associated signal change at the C6-7 level, consistent with compressive myelopathy. The level of compression is below the exiting level of the cervical nerve roots that supply the diaphragm (C3-C5). I discussed the findings via telephone with the patient's physician at 7:33 p.m. on 05/14/2016. Electronically Signed   By: Deatra Robinson M.D.   On: 05/14/2016 19:34   Mr Cervical Spine Wo Contrast  Result Date: 05/14/2016 CLINICAL DATA:  Progressive weakness EXAM: MRI HEAD WITHOUT CONTRAST MRI CERVICAL SPINE WITHOUT CONTRAST TECHNIQUE: Multiplanar, multiecho pulse sequences of the brain and surrounding structures, and cervical spine, to include the craniocervical junction and cervicothoracic junction, were obtained without intravenous contrast. COMPARISON:  Brain and cervical spine MRI 04/17/2016 FINDINGS: MRI HEAD FINDINGS Brain: There is a partially empty sella, unchanged. Midline structures are otherwise normal. No acute infarct or intraparenchymal hemorrhage. There is mild hyperintensity on the diffusion-weighted and T2-weighted images at the base of both precentral sulci and extending inferiorly along the corticospinal tracts to the level of the cerebral peduncles. This is a new finding compared to 04/17/2016. There is multifocal hyperintense  T2-weighted signal within the periventricular white matter, most often seen in the setting of chronic microvascular ischemia. These areas are unchanged compared to the prior study. No mass lesion or midline shift. No hydrocephalus or extra-axial fluid collection. No age advanced or lobar predominant atrophy. Cavum septum pellucidum et vergae. Vascular: Major intracranial arterial and venous sinus flow voids are preserved. No evidence of chronic microhemorrhage or amyloid angiopathy. Skull and upper cervical spine: The visualized skull base, calvarium, upper cervical spine and extracranial soft tissues are normal. Sinuses/Orbits: No fluid levels or advanced mucosal thickening. No mastoid effusion. Normal orbits. MRI CERVICAL SPINE FINDINGS Alignment: Alignment is unchanged with persistent straightening of the normal cervical lordosis. Vertebrae: There is ACDF extending from C4-C7 with C6 corpectomy. Unchanged appearance of small amount of fluid along the dorsal aspect of the fused levels. Cord: There is unchanged cord compression at the C6-7 level with associated hyperintense T2 weighted signal. Cord caliber is otherwise normal. No other focal cord signal abnormality. Posterior Fossa, vertebral arteries, paraspinal tissues: There is fluid layering within the nasopharynx, likely secondary to intubation. Disc levels: C1-C2: Normal. C2-C3: Mild right facet hypertrophy. Normal disc. No spinal canal stenosis. No neuroforaminal stenosis. C3-C4: Moderate bilateral facet hypertrophy. Normal disc. No spinal canal stenosis. No neuroforaminal stenosis. C4-C5:  Postfusion changes with small residual disc osteophyte complex. Mild spinal canal stenosis. Right facet hypertrophy contributes to mild right neuroforaminal stenosis. C5-C6: Right eccentric disc osteophyte complex and postfusion changes. Severe spinal canal stenosis with flattening of the spinal cord, unchanged. Severe right and moderate to severe left neuroforaminal  stenosis. C6-C7: Large central disc osteophyte complex, unchanged. Cord compression with signal change again seen. Severe spinal canal stenosis. No neuroforaminal stenosis. C7-T1: Normal disc space and facet joints. No spinal canal stenosis. No neuroforaminal stenosis. The T1-T2 level is visualized on sagittal images only. There is no significant disc herniation, spinal canal stenosis or neuroforaminal narrowing. IMPRESSION: 1. New areas of faint hyperintensity on T2- and diffusion-weighted imaging beginning at the base of the bilateral motor strips and extending along both corticospinal tracts to the level of the cerebral peduncles, new compared to 04/17/2016. This might be secondary to Wallerian degeneration related to the lower cervical spinal cord compression. However, these findings would also be consistent with amyotrophic lateral sclerosis (ALS), which is also a consideration. 2. Unchanged canal stenosis and cord compression at C6-7 and, to a lesser extent, C5-6 with associated signal change at the C6-7 level, consistent with compressive myelopathy. The level of compression is below the exiting level of the cervical nerve roots that supply the diaphragm (C3-C5). I discussed the findings via telephone with the patient's physician at 7:33 p.m. on 05/14/2016. Electronically Signed   By: Deatra RobinsonKevin  Herman M.D.   On: 05/14/2016 19:34   Dg Chest Port 1 View  Result Date: 05/15/2016 CLINICAL DATA:  Shortness of Breath EXAM: PORTABLE CHEST 1 VIEW COMPARISON:  05/14/2016 FINDINGS: Support devices are stable. Bilateral lower lobe airspace opacities are again noted, slightly increased since prior study. No visible effusions. Heart is normal size. IMPRESSION: Increasing bilateral airspace opacities. Electronically Signed   By: Charlett NoseKevin  Dover M.D.   On: 05/15/2016 07:28   Dg Chest Port 1 View  Result Date: 05/14/2016 CLINICAL DATA:  Acute on chronic respiratory failure. EXAM: PORTABLE CHEST 1 VIEW COMPARISON:   Radiograph of May 13, 2016. FINDINGS: The heart size and mediastinal contours are within normal limits. Endotracheal and nasogastric tubes are unchanged in position. Right-sided PICC line is unchanged. No pneumothorax or pleural effusion is noted. Stable bilateral lower lobe opacities are noted concerning for pneumonia. The visualized skeletal structures are unremarkable. IMPRESSION: Stable support apparatus. Stable bilateral lower lobe opacities as described above. Electronically Signed   By: Lupita RaiderJames  Green Jr, M.D.   On: 05/14/2016 11:36    ASSESSMENT / PLAN:  Pulmonary A: Acute on chronic hypercarbic respiratory failure in setting of neuro-muscular weakness with underlying COPD FOB 12/12>> few staph   - no change P: Full vent support Peep to goal 8 if able May need mucomyst's, pcxr bases in am Might need trach week of 05/16/16 if still not bed at [facility] BD's (spiriva, albuterol at home)abx as above  Assess NIF / vc  Cardiology A: Hypertension, Hyperlipidemia  CAD -normal ECHO Nov 2017   - Troponin very mild elevation resolved P: tele  Neurological A: Encephalopathy in setting of hypercarbic respiratory failure Progressive neuro-muscular weakness. 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   - awaiting [facility] bed for evaluation - neuro consult 05/14/16 gettting Neck MRI at [facility] - new Thoracic intensity   P: Supportive care Neuro consult input appreciate - might need to involve Dr Shon BatonBrooks v NSGY locally, will d/w neuro on this Given delalys  with transfer - best to activate neuro workup locally at [facility] and continue full workp and treatment (11 days and still not bed at [facility]) Will call [facility] back again and update and see if bed , she needs emg Consider phrenic nerve study  Get NIf/ vc  GI: A: Protein calorie malnutrition TF at goal   -constipated  P: Try dulcolax suppository +  continue other laxative Cont tube feeds PPI for SUP Monitor BM- had today yeah !!! Add mir  Renal: A: Low muscle mass P: crt may not reflect renal fxn well Even balance  HEME A: No acute P: [place] heparin   Infectious disease   MICRO:  Urine 12/5>>> EColi  BC x 2 12/12>>> BAL 12/12>>>few staph>>>  A: UTI (Ecoli) @ admit 05/04/2016  LLL VAP/collapse consolidation 05/11/16 - growing few staph 05/14/16  P: ABX:  Vanc 12/12>>>12/14, 12/15 >> Zosyn 12/12>>>12/16 unasyn 12/16  Follow staph, change zosyn to unasyn, continued vanc until r/o mrsa  Endocrine  Mild hyperglycemia  P: ssi   Musculoskeletal A: Foot drop   P: Podus boot (rotate q shift)  FAMILY No family at bedside 12/14, 12/15 - (per Rn husband visits late evenings).  Awaiting [facility] bed -- accepted but no beds available.  Will cal again   GLOBAL - No [facility] bed since admission. Will get MRI here perneuro and per that decide further neuro v ortho/nsgy workup. Continue full vent support. Try SBT. Might need trach here week of 05/16/16  Ccm time 35 min   Mcarthur Rossetti. Tyson Alias, MD, FACP Pgr: [phone] [facility]

## 2016-05-15 NOTE — Progress Notes (Signed)
NEURO HOSPITALIST PROGRESS NOTE   SUBJECTIVE:                                                                                                                        Patient remains neurologically unchanged. Intubated on the vent but open eyes spontaneously and able to follow commands. MRI cervical spine showed unchanged canal stenosis and cord compression at C6-7 and, to a lesser extent, C5-6 with associated signal change at the C6-7 level, consistent with compressive myelopathy. MRI brain: new areas of faint hyperintensity on T2- and diffusion-weighted imaging beginning at the base of the bilateral motor strips and extending along both corticospinal tracts to the level of the cerebral peduncles, new compared to 04/17/2016. This might be secondary to Wallerian degeneration related to the lower cervical spinal cord compression. However, these findings would also be consistent with amyotrophic lateral sclerosis (ALS).  OBJECTIVE:                                                                                                                           Vital signs in last 24 hours: Temp:  [98 F (36.7 C)-98.7 F (37.1 C)] 98.6 F (37 C) (12/16 0822) Pulse Rate:  [81-98] 91 (12/16 0500) Resp:  [15-23] 22 (12/16 0500) BP: (77-128)/(49-76) 113/61 (12/16 0500) SpO2:  [82 %-100 %] 100 % (12/16 0829) FiO2 (%):  [50 %-100 %] 70 % (12/16 0829) Weight:  [56.7 kg (125 lb)] 56.7 kg (125 lb) (12/16 0446)  Intake/Output from previous day: 12/15 0701 - 12/16 0700 In: 1565 [I.V.:120; NG/GT:1120; IV Piggyback:325] Out: 1180 [Urine:1055; Emesis/NG output:125] Intake/Output this shift: No intake/output data recorded. Nutritional status: Diet NPO time specified  Past Medical History:  Diagnosis Date  . Abnormal stress test 01/03/2013   false positive by Dr. Allyson SabalBerry  . Allergy   . Arthritis    degenerative lumbar spine   . Asthma   . CAD (coronary artery disease)  01/30/2013   single vessel disease of small second diagonal branch, normal EF 65%  . COPD (chronic obstructive pulmonary disease) (HCC)    reviewed by Dr. Sherene SiresWert  . Depression   . Emphysema lung (HCC)   . Fall 01/13/2016  . H/O cardiovascular stress test 2014  .  Hyperlipidemia   . Hypertension   . Neuropathy (HCC)   . Right bundle branch block   . Tobacco use    has tried to stop numerous times    Neurologic ROS unable to obtain due to clinical status, intubation.    Neurologic Exam:  General: NAD Mental Status: Intubated on the vent. Able to follow 3 step commands without difficulty. Cranial Nerves: II:  Visual fields grossly normal, pupils equal, round, reactive to light and accommodation III,IV, VI: ptosis not present, extra-ocular motions intact bilaterally V,VII: smile symmetric, facial light touch sensation normal bilaterally VIII: hearing normal bilaterally IX,X: intubated XI: bilateral shoulder shrug XII: midline tongue extension without atrophy or fasciculations Motor: 1/5 weakness bilateral upper and lower extremities bilateral but poor effort. Tone and bulk:normal tone throughout; no atrophy noted Sensory: Pinprick and light touch intact at the feet Deep Tendon Reflexes:  Brisk in the upper extremities; 1+ knee jerks Plantars: No tested Cerebellar: Unable to test due to weakness Gait:  Unable to test due to clinical condition    Lab Results: Lab Results  Component Value Date/Time   CHOL 163 01/31/2015 11:07 AM   Lipid Panel No results for input(s): CHOL, TRIG, HDL, CHOLHDL, VLDL, LDLCALC in the last 72 hours.  Studies/Results: Mr Brain Wo Contrast  Result Date: 05/14/2016 CLINICAL DATA:  Progressive weakness EXAM: MRI HEAD WITHOUT CONTRAST MRI CERVICAL SPINE WITHOUT CONTRAST TECHNIQUE: Multiplanar, multiecho pulse sequences of the brain and surrounding structures, and cervical spine, to include the craniocervical junction and cervicothoracic  junction, were obtained without intravenous contrast. COMPARISON:  Brain and cervical spine MRI 04/17/2016 FINDINGS: MRI HEAD FINDINGS Brain: There is a partially empty sella, unchanged. Midline structures are otherwise normal. No acute infarct or intraparenchymal hemorrhage. There is mild hyperintensity on the diffusion-weighted and T2-weighted images at the base of both precentral sulci and extending inferiorly along the corticospinal tracts to the level of the cerebral peduncles. This is a new finding compared to 04/17/2016. There is multifocal hyperintense T2-weighted signal within the periventricular white matter, most often seen in the setting of chronic microvascular ischemia. These areas are unchanged compared to the prior study. No mass lesion or midline shift. No hydrocephalus or extra-axial fluid collection. No age advanced or lobar predominant atrophy. Cavum septum pellucidum et vergae. Vascular: Major intracranial arterial and venous sinus flow voids are preserved. No evidence of chronic microhemorrhage or amyloid angiopathy. Skull and upper cervical spine: The visualized skull base, calvarium, upper cervical spine and extracranial soft tissues are normal. Sinuses/Orbits: No fluid levels or advanced mucosal thickening. No mastoid effusion. Normal orbits. MRI CERVICAL SPINE FINDINGS Alignment: Alignment is unchanged with persistent straightening of the normal cervical lordosis. Vertebrae: There is ACDF extending from C4-C7 with C6 corpectomy. Unchanged appearance of small amount of fluid along the dorsal aspect of the fused levels. Cord: There is unchanged cord compression at the C6-7 level with associated hyperintense T2 weighted signal. Cord caliber is otherwise normal. No other focal cord signal abnormality. Posterior Fossa, vertebral arteries, paraspinal tissues: There is fluid layering within the nasopharynx, likely secondary to intubation. Disc levels: C1-C2: Normal. C2-C3: Mild right facet  hypertrophy. Normal disc. No spinal canal stenosis. No neuroforaminal stenosis. C3-C4: Moderate bilateral facet hypertrophy. Normal disc. No spinal canal stenosis. No neuroforaminal stenosis. C4-C5: Postfusion changes with small residual disc osteophyte complex. Mild spinal canal stenosis. Right facet hypertrophy contributes to mild right neuroforaminal stenosis. C5-C6: Right eccentric disc osteophyte complex and postfusion changes. Severe spinal canal stenosis with flattening of the  spinal cord, unchanged. Severe right and moderate to severe left neuroforaminal stenosis. C6-C7: Large central disc osteophyte complex, unchanged. Cord compression with signal change again seen. Severe spinal canal stenosis. No neuroforaminal stenosis. C7-T1: Normal disc space and facet joints. No spinal canal stenosis. No neuroforaminal stenosis. The T1-T2 level is visualized on sagittal images only. There is no significant disc herniation, spinal canal stenosis or neuroforaminal narrowing. IMPRESSION: 1. New areas of faint hyperintensity on T2- and diffusion-weighted imaging beginning at the base of the bilateral motor strips and extending along both corticospinal tracts to the level of the cerebral peduncles, new compared to 04/17/2016. This might be secondary to Wallerian degeneration related to the lower cervical spinal cord compression. However, these findings would also be consistent with amyotrophic lateral sclerosis (ALS), which is also a consideration. 2. Unchanged canal stenosis and cord compression at C6-7 and, to a lesser extent, C5-6 with associated signal change at the C6-7 level, consistent with compressive myelopathy. The level of compression is below the exiting level of the cervical nerve roots that supply the diaphragm (C3-C5). I discussed the findings via telephone with the patient's physician at 7:33 p.m. on 05/14/2016. Electronically Signed   By: Deatra Robinson M.D.   On: 05/14/2016 19:34   Mr Cervical Spine Wo  Contrast  Result Date: 05/14/2016 CLINICAL DATA:  Progressive weakness EXAM: MRI HEAD WITHOUT CONTRAST MRI CERVICAL SPINE WITHOUT CONTRAST TECHNIQUE: Multiplanar, multiecho pulse sequences of the brain and surrounding structures, and cervical spine, to include the craniocervical junction and cervicothoracic junction, were obtained without intravenous contrast. COMPARISON:  Brain and cervical spine MRI 04/17/2016 FINDINGS: MRI HEAD FINDINGS Brain: There is a partially empty sella, unchanged. Midline structures are otherwise normal. No acute infarct or intraparenchymal hemorrhage. There is mild hyperintensity on the diffusion-weighted and T2-weighted images at the base of both precentral sulci and extending inferiorly along the corticospinal tracts to the level of the cerebral peduncles. This is a new finding compared to 04/17/2016. There is multifocal hyperintense T2-weighted signal within the periventricular white matter, most often seen in the setting of chronic microvascular ischemia. These areas are unchanged compared to the prior study. No mass lesion or midline shift. No hydrocephalus or extra-axial fluid collection. No age advanced or lobar predominant atrophy. Cavum septum pellucidum et vergae. Vascular: Major intracranial arterial and venous sinus flow voids are preserved. No evidence of chronic microhemorrhage or amyloid angiopathy. Skull and upper cervical spine: The visualized skull base, calvarium, upper cervical spine and extracranial soft tissues are normal. Sinuses/Orbits: No fluid levels or advanced mucosal thickening. No mastoid effusion. Normal orbits. MRI CERVICAL SPINE FINDINGS Alignment: Alignment is unchanged with persistent straightening of the normal cervical lordosis. Vertebrae: There is ACDF extending from C4-C7 with C6 corpectomy. Unchanged appearance of small amount of fluid along the dorsal aspect of the fused levels. Cord: There is unchanged cord compression at the C6-7 level with  associated hyperintense T2 weighted signal. Cord caliber is otherwise normal. No other focal cord signal abnormality. Posterior Fossa, vertebral arteries, paraspinal tissues: There is fluid layering within the nasopharynx, likely secondary to intubation. Disc levels: C1-C2: Normal. C2-C3: Mild right facet hypertrophy. Normal disc. No spinal canal stenosis. No neuroforaminal stenosis. C3-C4: Moderate bilateral facet hypertrophy. Normal disc. No spinal canal stenosis. No neuroforaminal stenosis. C4-C5: Postfusion changes with small residual disc osteophyte complex. Mild spinal canal stenosis. Right facet hypertrophy contributes to mild right neuroforaminal stenosis. C5-C6: Right eccentric disc osteophyte complex and postfusion changes. Severe spinal canal stenosis with flattening of the spinal  cord, unchanged. Severe right and moderate to severe left neuroforaminal stenosis. C6-C7: Large central disc osteophyte complex, unchanged. Cord compression with signal change again seen. Severe spinal canal stenosis. No neuroforaminal stenosis. C7-T1: Normal disc space and facet joints. No spinal canal stenosis. No neuroforaminal stenosis. The T1-T2 level is visualized on sagittal images only. There is no significant disc herniation, spinal canal stenosis or neuroforaminal narrowing. IMPRESSION: 1. New areas of faint hyperintensity on T2- and diffusion-weighted imaging beginning at the base of the bilateral motor strips and extending along both corticospinal tracts to the level of the cerebral peduncles, new compared to 04/17/2016. This might be secondary to Wallerian degeneration related to the lower cervical spinal cord compression. However, these findings would also be consistent with amyotrophic lateral sclerosis (ALS), which is also a consideration. 2. Unchanged canal stenosis and cord compression at C6-7 and, to a lesser extent, C5-6 with associated signal change at the C6-7 level, consistent with compressive myelopathy.  The level of compression is below the exiting level of the cervical nerve roots that supply the diaphragm (C3-C5). I discussed the findings via telephone with the patient's physician at 7:33 p.m. on 05/14/2016. Electronically Signed   By: Deatra Robinson M.D.   On: 05/14/2016 19:34   Dg Chest Port 1 View  Result Date: 05/15/2016 CLINICAL DATA:  Shortness of Breath EXAM: PORTABLE CHEST 1 VIEW COMPARISON:  05/14/2016 FINDINGS: Support devices are stable. Bilateral lower lobe airspace opacities are again noted, slightly increased since prior study. No visible effusions. Heart is normal size. IMPRESSION: Increasing bilateral airspace opacities. Electronically Signed   By: Charlett Nose M.D.   On: 05/15/2016 07:28   Dg Chest Port 1 View  Result Date: 05/14/2016 CLINICAL DATA:  Acute on chronic respiratory failure. EXAM: PORTABLE CHEST 1 VIEW COMPARISON:  Radiograph of May 13, 2016. FINDINGS: The heart size and mediastinal contours are within normal limits. Endotracheal and nasogastric tubes are unchanged in position. Right-sided PICC line is unchanged. No pneumothorax or pleural effusion is noted. Stable bilateral lower lobe opacities are noted concerning for pneumonia. The visualized skeletal structures are unremarkable. IMPRESSION: Stable support apparatus. Stable bilateral lower lobe opacities as described above. Electronically Signed   By: Lupita Raider, M.D.   On: 05/14/2016 11:36    MEDICATIONS                                                                                                                       I have reviewed the patient's current medications.  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 Repeat MRI cervical spine is unchanged but MRI brain with new finding of faint hyperintensity on T2- and diffusion-weighted imaging beginning at the base of the bilateral motor strips and extending along both corticospinal tracts to the level of the cerebral peduncles wich might be secondary to Wallerian degeneration related to the lower cervical spinal cord compression. However, these findings would also be consistent with amyotrophic lateral sclerosis (ALS). [facility]. Patient definitely needs repeat EMG/NCS is to look for other potential etiology like motor neuron disease but unable to do in the hospital.  Wyatt Portelasvaldo Kazue Cerro, MD [facility] [phone]  05/15/2016, 9:05 AM

## 2016-05-15 NOTE — Progress Notes (Signed)
RT note- NIF-8 and FVC 

## 2016-05-15 NOTE — Progress Notes (Signed)
RT NOTE:  NIF:  -10 FVC:  548 mls

## 2016-05-16 ENCOUNTER — Inpatient Hospital Stay (HOSPITAL_COMMUNITY): Payer: Medicare Other

## 2016-05-16 LAB — CBC WITH DIFFERENTIAL/PLATELET
Basophils Absolute: 0 10*3/uL (ref 0.0–0.1)
Basophils Relative: 0 %
Eosinophils Absolute: 0.1 10*3/uL (ref 0.0–0.7)
Eosinophils Relative: 1 %
HEMATOCRIT: 31.6 % — AB (ref 36.0–46.0)
HEMOGLOBIN: 10 g/dL — AB (ref 12.0–15.0)
LYMPHS ABS: 1.1 10*3/uL (ref 0.7–4.0)
LYMPHS PCT: 8 %
MCH: 30.2 pg (ref 26.0–34.0)
MCHC: 31.6 g/dL (ref 30.0–36.0)
MCV: 95.5 fL (ref 78.0–100.0)
MONOS PCT: 8 %
Monocytes Absolute: 1 10*3/uL (ref 0.1–1.0)
NEUTROS ABS: 10.7 10*3/uL — AB (ref 1.7–7.7)
NEUTROS PCT: 83 %
Platelets: 430 10*3/uL — ABNORMAL HIGH (ref 150–400)
RBC: 3.31 MIL/uL — ABNORMAL LOW (ref 3.87–5.11)
RDW: 13.9 % (ref 11.5–15.5)
WBC: 12.9 10*3/uL — ABNORMAL HIGH (ref 4.0–10.5)

## 2016-05-16 LAB — CULTURE, BLOOD (SINGLE): Culture: NO GROWTH

## 2016-05-16 LAB — CULTURE, BLOOD (ROUTINE X 2): Culture: NO GROWTH

## 2016-05-16 LAB — BASIC METABOLIC PANEL
ANION GAP: 8 (ref 5–15)
BUN: 24 mg/dL — ABNORMAL HIGH (ref 6–20)
CHLORIDE: 97 mmol/L — AB (ref 101–111)
CO2: 36 mmol/L — AB (ref 22–32)
Calcium: 9.3 mg/dL (ref 8.9–10.3)
Creatinine, Ser: <0.3 mg/dL — ABNORMAL LOW (ref 0.44–1.00)
GLUCOSE: 120 mg/dL — AB (ref 65–99)
POTASSIUM: 3.5 mmol/L (ref 3.5–5.1)
Sodium: 141 mmol/L (ref 135–145)

## 2016-05-16 LAB — GLUCOSE, CAPILLARY
GLUCOSE-CAPILLARY: 113 mg/dL — AB (ref 65–99)
GLUCOSE-CAPILLARY: 130 mg/dL — AB (ref 65–99)
Glucose-Capillary: 112 mg/dL — ABNORMAL HIGH (ref 65–99)
Glucose-Capillary: 132 mg/dL — ABNORMAL HIGH (ref 65–99)

## 2016-05-16 LAB — PHOSPHORUS: Phosphorus: 2.5 mg/dL (ref 2.5–4.6)

## 2016-05-16 LAB — MAGNESIUM: MAGNESIUM: 2.1 mg/dL (ref 1.7–2.4)

## 2016-05-16 MED ORDER — HEPARIN SODIUM (PORCINE) 5000 UNIT/ML IJ SOLN: 5000.0000 [IU] | Freq: Three times a day (TID) | INTRAMUSCULAR | Status: AC

## 2016-05-16 MED ORDER — SENNOSIDES 8.8 MG/5ML PO SYRP: 5.0000 mL | ORAL_SOLUTION | Freq: Two times a day (BID) | ORAL | 0 refills | Status: AC

## 2016-05-16 MED ORDER — ACETYLCYSTEINE 20 % IN SOLN: 4.0000 mL | Freq: Two times a day (BID) | RESPIRATORY_TRACT | 12 refills | Status: AC

## 2016-05-16 MED ORDER — FENTANYL CITRATE (PF) 100 MCG/2ML IJ SOLN: 12.5000 ug | INTRAMUSCULAR | 0 refills | Status: AC | PRN

## 2016-05-16 MED ORDER — DEXTROSE-NACL 5-0.45 % IV SOLN: 10.0000 mL/h | INTRAVENOUS | Status: AC

## 2016-05-16 MED ORDER — SODIUM CHLORIDE 0.9 % IV SOLN: 250.0000 mL | INTRAVENOUS | 0 refills | Status: AC | PRN

## 2016-05-16 MED ORDER — CHLORHEXIDINE GLUCONATE 0.12% ORAL RINSE (MEDLINE KIT): 15.0000 mL | Freq: Two times a day (BID) | OROMUCOSAL | 0 refills | Status: AC

## 2016-05-16 MED ORDER — SODIUM CHLORIDE 0.9% FLUSH: 10.0000 mL | Freq: Two times a day (BID) | INTRAVENOUS | Status: AC

## 2016-05-16 MED ORDER — ORAL CARE MOUTH RINSE: 15.0000 mL | Freq: Four times a day (QID) | OROMUCOSAL | 0 refills | Status: AC

## 2016-05-16 MED ORDER — PSEUDOEPHEDRINE-GUAIFENESIN ER 60-600 MG PO TB12: 1.0000 | ORAL_TABLET | Freq: Two times a day (BID) | ORAL | Status: AC

## 2016-05-16 MED ORDER — POLYETHYLENE GLYCOL 3350 17 G PO PACK: 17.0000 g | PACK | Freq: Every day | ORAL | 0 refills | Status: AC

## 2016-05-16 MED ORDER — ACETYLCYSTEINE 20 % IN SOLN
4.0000 mL | Freq: Two times a day (BID) | RESPIRATORY_TRACT | Status: DC
  Administered 2016-05-16: 4 mL via RESPIRATORY_TRACT
  Filled 2016-05-16: qty 4

## 2016-05-16 MED ORDER — MIDAZOLAM HCL 2 MG/2ML IJ SOLN: 1.0000 mg | INTRAMUSCULAR | 0 refills | Status: AC | PRN

## 2016-05-16 MED ORDER — SODIUM CHLORIDE 0.9 % IV SOLN: 3.0000 g | Freq: Three times a day (TID) | INTRAVENOUS | Status: AC

## 2016-05-16 MED ORDER — ATROPINE SULFATE 1 MG/10ML IJ SOSY
PREFILLED_SYRINGE | INTRAMUSCULAR | Status: AC
  Filled 2016-05-16: qty 10

## 2016-05-16 MED ORDER — DOCUSATE SODIUM 50 MG/5ML PO LIQD: 100.0000 mg | Freq: Two times a day (BID) | ORAL | 0 refills | Status: AC

## 2016-05-16 MED ORDER — ZOLPIDEM TARTRATE 5 MG PO TABS: 5.0000 mg | ORAL_TABLET | Freq: Every day | ORAL | 0 refills | Status: AC

## 2016-05-16 MED ORDER — FAMOTIDINE 40 MG/5ML PO SUSR: 20.0000 mg | Freq: Every day | ORAL | 0 refills | Status: AC

## 2016-05-16 MED ORDER — VITAL AF 1.2 CAL PO LIQD: 1000.0000 mL | ORAL | Status: AC

## 2016-05-16 MED ORDER — SODIUM CHLORIDE 0.9% FLUSH: 10.0000 mL | INTRAVENOUS | Status: AC | PRN

## 2016-05-16 MED ORDER — ALBUTEROL SULFATE (2.5 MG/3ML) 0.083% IN NEBU: 2.5000 mg | INHALATION_SOLUTION | Freq: Four times a day (QID) | RESPIRATORY_TRACT | 12 refills | Status: AC

## 2016-05-16 NOTE — Progress Notes (Signed)
Name: Tara Bottcheratricia A Bunkley MRN: [id number] DOB: 31-May-1951    ADMISSION DATE:  05/04/2016   CHIEF COMPLAINT:  Shortness of Breath  Brief Hx: 65 year old female w/ prior C4-C7 ACDF back in June 2017. 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 Since this time has had progressive ascending weakness, weight loss and recurrent acute on chronic hypercarbia. She was actually referred to Dr Christene LyeKarikari ([facility] by Dr Shon BatonBrooks but did not get to the appointment). Intubated 12/6, tube feedings started 12/6. Started Keflex 12/7 for ecoli UTI. [facility] Dr Clelia CroftShaw and awaiting bed assignment.  STUDIES & EVENTS 10/21/15 EMG/NCS [Dr. 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 Considerable artifact from the fusion hardware. 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 Abnormal T2 signal in the cord immediately distal to that consistent with gliosis. - Facet arthropathy at C2-3 and C3-4 with neural foraminal stenosis that could be symptomatic. 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 Canal diameter 4 mm. Increasingly prominent 5 x 3 x 15 mm T2 hyperintense fluid collection dorsal to but within the surgical construct, uncertain significance.  12/15- foley leaking. No BM per rN. MRI repeat - of C Spine -ordered by neuro.  [facility]. Per RN female signficant other comes in evening.  Trach asporate  12/12 growing  staph  SIGNIFICANT EVENTS  [facility]; [facility] but [facility].  E Colii unirine at admit 12/6 electively intubated. Tube feeds started.  12/10 -   Waiting for [facility] bed.  No sig change.  Agitated.  Tol PS wean 10/5 x 4 hours.  Got lethargic, hypoxic.  12/12 FOB for new acute LLL consolidation/collapse. STart vanc/zosyn 12/14- No acute change overnight. Stopped vanc   SUBJECTIVE/OVERNIGHT/INTERVAL HX: Came off vent and had some hypoxia brady   VITAL SIGNS: Temp:  [97.4 F (36.3 C)-99.4 F (37.4 C)] 98.4 F (36.9 C) (12/17 0403) Pulse Rate:  [75-143] 78 (12/17 0700) Resp:  [18-26] 19 (12/17 0700) BP: (114-153)/(61-77) 153/72 (12/17 0700) SpO2:  [81 %-100 %] 96 % (12/17 0700) FiO2 (%):  [50 %-70 %] 50 % (12/17 0324) Weight:  [57.2 kg (126 lb 1.7 oz)] 57.2 kg (126 lb 1.7 oz) (12/17 0500)  PHYSICAL EXAMINATION: General:  Eyes open. Comfortable. Weak.  NAD  Integument:  no rash HEENT:  ett, jvd wnl Cardiovascular: S1 S2 rrr Pulmonary: coarse Abdomen: Soft. Normal bowel sounds. Nondistended. Nontender. Neurological: nods appropriately, fc wnl, Continues to have significant lower extremity weakness  PULMONARY No results for input(s): PHART, PCO2ART, PO2ART, HCO3, TCO2, O2SAT in the last 168 hours.  Invalid input(s): PCO2, PO2  CBC  Recent Labs Lab 05/14/16 0500 05/15/16 0435 05/16/16 0400  HGB 10.0* 9.7* 10.0*  HCT 32.0* 30.9* 31.6*  WBC 17.2* 13.5* 12.9*  PLT 349 375 430*    COAGULATION No results for input(s): INR in the last 168 hours.  CARDIAC    Recent Labs Lab 05/11/16 1026 05/11/16 1816  05/12/16 0002 05/12/16 0704 05/12/16 1026  TROPONINI 0.03* 0.03* 0.05* <0.03 0.03*   No results for input(s): PROBNP in the last 168 hours.   CHEMISTRY  Recent Labs Lab 05/10/16 2133  05/12/16 0704 05/13/16 0510 05/14/16 0500 05/15/16 0430 05/16/16 0400  NA 136  --  136 138  --  141 141  K 4.5  --  3.7 3.5  --  4.0 3.5  CL 92*   --  93* 92*  --  94* 97*  CO2 37*  --  35* 38*  --  38* 36*  GLUCOSE 125*  --  131* 136*  --  121* 120*  BUN 20  --  18 23*  --  29* 24*  CREATININE 0.30*  --  <0.30* <0.30*  --  0.30* <0.30*  CALCIUM 9.6  --  9.4 9.1  --  9.6 9.3  MG  --   < > 2.2 2.2 2.2 2.3 2.1  PHOS  --   < > 3.0 2.9 2.7 2.8 2.5  < > = values in this interval not displayed. CrCl cannot be calculated (This lab value cannot be used to calculate CrCl because it is not a number: <0.30).   LIVER No results for input(s): AST, ALT, ALKPHOS, BILITOT, PROT, ALBUMIN, INR in the last 168 hours.   INFECTIOUS No results for input(s): LATICACIDVEN, PROCALCITON in the last 168 hours.   ENDOCRINE CBG (last 3)   Recent Labs  05/15/16 2001 05/16/16 0013 05/16/16 0401  GLUCAP 116* 112* 113*       IMAGING x48h   Mr Brain Wo Contrast  Result Date: 05/14/2016 CLINICAL DATA:  Progressive weakness EXAM: MRI HEAD WITHOUT CONTRAST MRI CERVICAL SPINE WITHOUT CONTRAST TECHNIQUE: Multiplanar, multiecho pulse sequences of the brain and surrounding structures, and cervical spine, to include the craniocervical junction and cervicothoracic junction, were obtained without intravenous contrast. COMPARISON:  Brain and cervical spine MRI 04/17/2016 FINDINGS: MRI HEAD FINDINGS Brain: There is a partially empty sella, unchanged. Midline structures are otherwise normal. No acute infarct or intraparenchymal hemorrhage. There is mild hyperintensity on the diffusion-weighted and T2-weighted images at the base of both precentral sulci and extending inferiorly along the corticospinal tracts to the level of the cerebral peduncles. This is a new finding compared to 04/17/2016. There is multifocal hyperintense T2-weighted signal within the periventricular white matter, most often seen in the setting of chronic microvascular ischemia. These areas are unchanged compared to the prior study. No mass lesion or midline shift. No hydrocephalus or extra-axial  fluid collection. No age advanced or lobar predominant atrophy. Cavum septum pellucidum et vergae. Vascular: Major intracranial arterial and venous sinus flow voids are preserved. No evidence of chronic microhemorrhage or amyloid angiopathy. Skull and upper cervical spine: The visualized skull base, calvarium, upper cervical spine and extracranial soft tissues are normal. Sinuses/Orbits: No fluid levels or advanced mucosal thickening. No mastoid effusion. Normal orbits. MRI CERVICAL SPINE FINDINGS Alignment: Alignment is unchanged with persistent straightening of the normal cervical lordosis. Vertebrae: There is ACDF extending from C4-C7 with C6 corpectomy. Unchanged appearance of small amount of fluid along the dorsal aspect of the fused levels. Cord: There is unchanged cord compression at the C6-7 level with associated hyperintense T2 weighted signal. Cord caliber is otherwise normal. No other focal cord signal abnormality. Posterior Fossa, vertebral arteries, paraspinal tissues: There is fluid layering within the nasopharynx, likely secondary to intubation. Disc levels: C1-C2: Normal. C2-C3: Mild right facet hypertrophy. Normal disc. No spinal canal stenosis. No neuroforaminal stenosis.  C3-C4: Moderate bilateral facet hypertrophy. Normal disc. No spinal canal stenosis. No neuroforaminal stenosis. C4-C5: Postfusion changes with small residual disc osteophyte complex. Mild spinal canal stenosis. Right facet hypertrophy contributes to mild right neuroforaminal stenosis. C5-C6: Right eccentric disc osteophyte complex and postfusion changes. Severe spinal canal stenosis with flattening of the spinal cord, unchanged. Severe right and moderate to severe left neuroforaminal stenosis. C6-C7: Large central disc osteophyte complex, unchanged. Cord compression with signal change again seen. Severe spinal canal stenosis. No neuroforaminal stenosis. C7-T1: Normal disc space and facet joints. No spinal canal stenosis. No  neuroforaminal stenosis. The T1-T2 level is visualized on sagittal images only. There is no significant disc herniation, spinal canal stenosis or neuroforaminal narrowing. IMPRESSION: 1. New areas of faint hyperintensity on T2- and diffusion-weighted imaging beginning at the base of the bilateral motor strips and extending along both corticospinal tracts to the level of the cerebral peduncles, new compared to 04/17/2016. This might be secondary to Wallerian degeneration related to the lower cervical spinal cord compression. However, these findings would also be consistent with amyotrophic lateral sclerosis (ALS), which is also a consideration. 2. Unchanged canal stenosis and cord compression at C6-7 and, to a lesser extent, C5-6 with associated signal change at the C6-7 level, consistent with compressive myelopathy. The level of compression is below the exiting level of the cervical nerve roots that supply the diaphragm (C3-C5). I discussed the findings via telephone with the patient's physician at 7:33 p.m. on 05/14/2016. Electronically Signed   By: Deatra RobinsonKevin  Herman M.D.   On: 05/14/2016 19:34   Mr Cervical Spine Wo Contrast  Result Date: 05/14/2016 CLINICAL DATA:  Progressive weakness EXAM: MRI HEAD WITHOUT CONTRAST MRI CERVICAL SPINE WITHOUT CONTRAST TECHNIQUE: Multiplanar, multiecho pulse sequences of the brain and surrounding structures, and cervical spine, to include the craniocervical junction and cervicothoracic junction, were obtained without intravenous contrast. COMPARISON:  Brain and cervical spine MRI 04/17/2016 FINDINGS: MRI HEAD FINDINGS Brain: There is a partially empty sella, unchanged. Midline structures are otherwise normal. No acute infarct or intraparenchymal hemorrhage. There is mild hyperintensity on the diffusion-weighted and T2-weighted images at the base of both precentral sulci and extending inferiorly along the corticospinal tracts to the level of the cerebral peduncles. This is a new  finding compared to 04/17/2016. There is multifocal hyperintense T2-weighted signal within the periventricular white matter, most often seen in the setting of chronic microvascular ischemia. These areas are unchanged compared to the prior study. No mass lesion or midline shift. No hydrocephalus or extra-axial fluid collection. No age advanced or lobar predominant atrophy. Cavum septum pellucidum et vergae. Vascular: Major intracranial arterial and venous sinus flow voids are preserved. No evidence of chronic microhemorrhage or amyloid angiopathy. Skull and upper cervical spine: The visualized skull base, calvarium, upper cervical spine and extracranial soft tissues are normal. Sinuses/Orbits: No fluid levels or advanced mucosal thickening. No mastoid effusion. Normal orbits. MRI CERVICAL SPINE FINDINGS Alignment: Alignment is unchanged with persistent straightening of the normal cervical lordosis. Vertebrae: There is ACDF extending from C4-C7 with C6 corpectomy. Unchanged appearance of small amount of fluid along the dorsal aspect of the fused levels. Cord: There is unchanged cord compression at the C6-7 level with associated hyperintense T2 weighted signal. Cord caliber is otherwise normal. No other focal cord signal abnormality. Posterior Fossa, vertebral arteries, paraspinal tissues: There is fluid layering within the nasopharynx, likely secondary to intubation. Disc levels: C1-C2: Normal. C2-C3: Mild right facet hypertrophy. Normal disc. No spinal canal stenosis. No neuroforaminal stenosis. C3-C4:  Moderate bilateral facet hypertrophy. Normal disc. No spinal canal stenosis. No neuroforaminal stenosis. C4-C5: Postfusion changes with small residual disc osteophyte complex. Mild spinal canal stenosis. Right facet hypertrophy contributes to mild right neuroforaminal stenosis. C5-C6: Right eccentric disc osteophyte complex and postfusion changes. Severe spinal canal stenosis with flattening of the spinal cord,  unchanged. Severe right and moderate to severe left neuroforaminal stenosis. C6-C7: Large central disc osteophyte complex, unchanged. Cord compression with signal change again seen. Severe spinal canal stenosis. No neuroforaminal stenosis. C7-T1: Normal disc space and facet joints. No spinal canal stenosis. No neuroforaminal stenosis. The T1-T2 level is visualized on sagittal images only. There is no significant disc herniation, spinal canal stenosis or neuroforaminal narrowing. IMPRESSION: 1. New areas of faint hyperintensity on T2- and diffusion-weighted imaging beginning at the base of the bilateral motor strips and extending along both corticospinal tracts to the level of the cerebral peduncles, new compared to 04/17/2016. This might be secondary to Wallerian degeneration related to the lower cervical spinal cord compression. However, these findings would also be consistent with amyotrophic lateral sclerosis (ALS), which is also a consideration. 2. Unchanged canal stenosis and cord compression at C6-7 and, to a lesser extent, C5-6 with associated signal change at the C6-7 level, consistent with compressive myelopathy. The level of compression is below the exiting level of the cervical nerve roots that supply the diaphragm (C3-C5). I discussed the findings via telephone with the patient's physician at 7:33 p.m. on 05/14/2016. Electronically Signed   By: Deatra Robinson M.D.   On: 05/14/2016 19:34   Dg Chest Port 1 View  Result Date: 05/16/2016 CLINICAL DATA:  65 year old female with pneumonia. EXAM: PORTABLE CHEST 1 VIEW COMPARISON:  Chest radiograph dated 05/15/2016 FINDINGS: Right-sided PICC with tip over cavoatrial junction. Endotracheal tube approximately 5 cm above the carina in stable positioning. Enteric tube extends into the left upper abdomen. Bilateral lower lung field airspace densities appear similar to prior radiograph. No significant pleural effusion or pneumothorax. The cardiac silhouette is  within normal limits. No acute osseous pathology. Cervical spine fusion hardware is partially visualized. IMPRESSION: Bibasilar airspace densities with no significant interval change. Follow-up recommended. Support devices in stable positioning. Electronically Signed   By: Elgie Collard M.D.   On: 05/16/2016 07:21   Dg Chest Port 1 View  Result Date: 05/15/2016 CLINICAL DATA:  Shortness of Breath EXAM: PORTABLE CHEST 1 VIEW COMPARISON:  05/14/2016 FINDINGS: Support devices are stable. Bilateral lower lobe airspace opacities are again noted, slightly increased since prior study. No visible effusions. Heart is normal size. IMPRESSION: Increasing bilateral airspace opacities. Electronically Signed   By: Charlett Nose M.D.   On: 05/15/2016 07:28   Dg Chest Port 1 View  Result Date: 05/14/2016 CLINICAL DATA:  Acute on chronic respiratory failure. EXAM: PORTABLE CHEST 1 VIEW COMPARISON:  Radiograph of May 13, 2016. FINDINGS: The heart size and mediastinal contours are within normal limits. Endotracheal and nasogastric tubes are unchanged in position. Right-sided PICC line is unchanged. No pneumothorax or pleural effusion is noted. Stable bilateral lower lobe opacities are noted concerning for pneumonia. The visualized skeletal structures are unremarkable. IMPRESSION: Stable support apparatus. Stable bilateral lower lobe opacities as described above. Electronically Signed   By: Lupita Raider, M.D.   On: 05/14/2016 11:36    ASSESSMENT / PLAN:  Pulmonary A: Acute on chronic hypercarbic respiratory failure in setting of neuro-muscular weakness with underlying COPD FOB 12/12>> few staph  P: Full vent support Peep to goal 8 remains, to  50% successful sbt okay cpap 8, ps 10 Add mucomyst's, pcxr increased atx and rapid declines with poop off vent Add chest pt, repeat pcxr Might need trach week of 05/16/16- will plan Monday given days on vent and lack of progress BD's (spiriva, albuterol at  home)abx as above  Assess NIF / vc- poor , noted, repeat  abx  Cardiology A: Hypertension, Hyperlipidemia  CAD -normal ECHO Nov 2017  - Troponin very mild elevation resolved P: tele  Neurological A: Encephalopathy in setting of hypercarbic respiratory failure Progressive neuro-muscular weakness. 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   - awaiting [facility] bed for evaluation, called daily, did again and acceopted - nif -neuro recs emg  P: Supportive care Will call [facility] back again and update and see if bed , she needs emg- done accepted today Consider phrenic nerve study  Get NIf/ vc- done poor  GI: A: Protein calorie malnutrition TF at goal  P: Try dulcolax suppository + continue other laxative Cont tube feeds PPI for SUP mirilax  Renal: A: Low muscle mass P: crt may not reflect renal fxn well Even balance  HEME A: No acute P: [place] heparin   Infectious disease   MICRO:  Urine 12/5>>> EColi  BC x 2 12/12>>> BAL 12/12>>>few staph>>>  A: UTI (Ecoli) @ admit 05/04/2016  LLL VAP/collapse consolidation 05/11/16 - growing few staph 05/14/16  P: ABX:  Vanc 12/12>>>12/14, 12/15 >> Zosyn 12/12>>>12/16 unasyn 12/16  Follow staph, change zosyn to unasyn, continued vanc until r/o mrsa  Endocrine  Mild hyperglycemia  P: ssi   Musculoskeletal A: Foot drop   P: Podus boot (rotate q shift)  FAMILY No family at bedside 12/14, 12/15 - (per Rn husband visits late evenings).  Awaiting [facility] bed -- accepted and told a bed today, if not available , will trach in am and send to another facility for emg   GLOBAL - No [facility] bed since admission. Will get MRI here perneuro and per that decide further neuro v ortho/nsgy workup. Continue full vent support. Try SBT. Might need trach here week of 05/16/16  Ccm time 35 min   Mcarthur Rossetti. Tyson Alias, MD, FACP Pgr: [phone] [place]  [facility]ulmonary [facility]

## 2016-05-16 NOTE — Discharge Summary (Signed)
Physician Discharge Summary  Patient ID: Tara Cruz MRN: [id number] DOB/AGE: 67/19/1952 67 y.o.  Admit date: 05/04/2016 Discharge date: 05/16/2016    Discharge Diagnoses:  1. Acute on chronic Hypercarbic Respiratory Failure in setting of neuromuscular weakness.  2. COPD Exacerbation  3. 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 UTI - E coli  5. HCAP -Staph aureus  6. Protein calorie malnutrition  7 . Encephalopathy secondary to probable hypercarbia                                                                      DISCHARGE PLAN BY DIAGNOSIS      Discharge Plan: Pulmonary A: Acute on chronic hypercarbic respiratory failure in setting of neuro-muscular weakness with underlying COPD (PFT's 01/11/2014 FEV1  2.04 (83%) ratio 65 no change p B2 and DLCO 64 %)  FOB 12/12>> few staph  P: Full vent support Peep to goal 8 remains, to 50% successful Continue mucomyst and Albuterol  Cont Chest PT  Transfer to [place] on hold   Cardiology A: Hypertension, Hyperlipidemia  CAD -normal ECHO Nov 2017  - Troponin very mild elevation resolved P: Continue Tele    Neurological A: Encephalopathy in setting of hypercarbic respiratory failure Progressive neuro-muscular weakness. 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   -Transfer to [facility] Neuro[facility]urosurgery    GI: A: Protein calorie malnutrition -TF at goal Constipation   P: dulcolax suppository + continue other laxative Cont tube feeds Pepcid for SUP mirilax  Renal: A: Low muscle mass P: Goal Even balance  HEME A: Mild Anemia  P: [place] heparin  Trend CBC   Infectious disease   MICRO:  Urine 12/5>>> EColi  BC x 2 12/12>>> BAL 12/12>>>few staph>>>  A: UTI (Ecoli) @ admit 05/04/2016  LLL VAP/collapse consolidation 05/11/16 - growing few staph  05/14/16  P: ABX:  Vanc 12/12>>>12/14, 12/15 >> Zosyn 12/12>>>12/16 unasyn 12/16  Follow staph, change zosyn to unasyn, continued vanc until r/o mrsa  Endocrine  Mild hyperglycemia  P: ssi   Musculoskeletal A: Foot drop   P: Podus boot (rotate q shift)                  DISCHARGE SUMMARY   Tara Cruz is a 65 y.o. y/o female with a PMH of COPD , DJD , HTN, HLD and CAD . She presented 12/5 with shortness of breath to OSH.  She had a prior C4 -C7 ACDF in 10/2015 . Since surgery with a progressive neuromuscular decline w/ ascending weakness , weight loss and recurrent decompensated hypercarbic respiratory failure. She is followed  by Dr. Rolena Infante , Orthopedics . Post op MRI showed posterior projecting bone at inferior C6 level effaces the ventral subarachnoid space and indents the cord.   12/4 admitted to [facility] with shortness of breath. 12/5 admitted to [facility] MICU for further work up. On evaluation of the case and discussion w/ Dr Rolena Infante her most recent MRI imaging demonstrated ncreasing cord edema/myelomalacia compared with July 2017. She was supposed to be Seen By [facility] (Dr Laren Everts) but the appointment had not been  set up. We felt based on her history her needs would be best served at an academic facility. [facility] was contacted and accepted for transfer to the neuro-ICU. It was felt she needed transfer to [facility] for evaluation by neurosurgical team. She was accepted 05/05/16 but bed has been unavailable . Marland Kitchen She was initially placed on BIPAP however was intubated for worsening hypercarbia . Pt was intubated 05/05/16 for progressive respiratory decline.  She has been attempted to wean on vent  but unsucessful . Treated for E coli UTI on 12/7 . Marland Kitchen She had new LLL consolidation /collapse on chest xray 12/12 . Bedside FOB with large copious secretions noted, BAL showed few staph. She was started on Vanc and Zosyn for probable HCAP . Abx were broadened  12/16 with Vanc and Unasyn . [facility] was called again on 12/17 with available bed. Pt will be prepared for discharge/transfer to [facility] .       SIGNIFICANT DIAGNOSTIC STUDIES  10/21/15 EMG/NCS [Dr. 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 Considerable artifact from the fusion hardware. 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 Abnormal T2 signal in the cord immediately distal to that consistent with gliosis. - Facet arthropathy at C2-3 and C3-4 with neural foraminal stenosis that could be symptomatic. 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 Canal diameter 4 mm. Increasingly prominent 5 x 3 x 15 mm T2 hyperintense fluid collection dorsal to but within the surgical construct, uncertain significance.  12/15- foley leaking. No BM per rN. MRI repeat - of C Spine -ordered by neuro.  Still no news from [facility] regarding bed. Per RN female signficant other comes in evening.  Trach asporate 12/12 growing  staph   MICRO DATA  MICRO:  Urine 12/5>>> EColi  BC x 2 12/12>>> BAL 12/12>>>few staph>>>  ANTIBIOTICS Vanc 12/12>>>12/14, 12/15 >> Zosyn 12/12>>>12/16 unasyn 12/16  CONSULTS Ortho 12/5   TUBES / LINES ETT 05/05/16   SIGNIFICANT EVENTS  12/5 admitted [facility]; accepted at [facility] but no beds.  E Colii unirine at admit 12/6 electively intubated. Tube feeds started.  12/10 -   Waiting for [facility] bed.  No sig change.  Agitated.  Tol PS wean 10/5 x 4 hours.  Got lethargic, hypoxic.  12/12 FOB for new acute LLL  consolidation/collapse. STart vanc/zosyn (few staph aureus)  12/14- No acute change overnight. Stopped vanc   Discharge Exam: General: Eyes open. Comfortable. Weak.  NAD  Integument: no rash HEENT: ett, jvd wnl Cardiovascular: S1 S2 rrr Pulmonary: coarse Abdomen: Soft. Normal bowel sounds. Nondistended. Nontender. Neurological: nods appropriately, fc wnl, Continues to have significant lower extremity weakness  Vitals:   05/16/16 0700 05/16/16 0828 05/16/16 0848 05/16/16 0907  BP: (!) 153/72  (!) 155/76   Pulse: 78  76   Resp: 19  (!) 24   Temp:  98.4 F (36.9 C)    TempSrc:  Oral    SpO2: 96%  92% 93%  Weight:      Height:         Discharge Labs  BMET  Recent Labs Lab 05/10/16 2133  05/12/16 0704 05/13/16 0510 05/14/16 0500 05/15/16 0430 05/16/16 0400  NA 136  --  136 138  --  141 141  K 4.5  --  3.7 3.5  --  4.0 3.5  CL 92*  --  93* 92*  --  94* 97*  CO2 37*  --  35* 38*  --  38* 36*  GLUCOSE 125*  --  131* 136*  --  121* 120*  BUN 20  --  18 23*  --  29* 24*  CREATININE 0.30*  --  <0.30* <0.30*  --  0.30* <0.30*  CALCIUM 9.6  --  9.4 9.1  --  9.6 9.3  MG  --   < > 2.2 2.2 2.2 2.3 2.1  PHOS  --   < > 3.0 2.9 2.7 2.8 2.5  < > = values in this interval not displayed.  CBC  Recent Labs Lab 05/14/16 0500 05/15/16 0435 05/16/16 0400  HGB 10.0* 9.7* 10.0*  HCT 32.0* 30.9* 31.6*  WBC 17.2* 13.5* 12.9*  PLT 349 375 430*    Anti-Coagulation No results for input(s): INR in the last 168 hours.  Discharge Instructions    Diet - low sodium heart healthy    Complete by:  As directed    Increase activity slowly    Complete by:  As directed       No current facility-administered medications on file prior to encounter.    Current Outpatient Prescriptions on File Prior to Encounter  Medication Sig Dispense Refill  . albuterol (PROVENTIL HFA;VENTOLIN HFA) 108 (90 Base) MCG/ACT inhaler Inhale 2 puffs into the lungs every 4 (four) hours as needed for  wheezing or shortness of breath.     . celecoxib (CELEBREX) 200 MG capsule Take 200 mg by mouth daily.    . cetirizine (ZYRTEC) 10 MG tablet Take 10 mg by mouth daily.    Marland Kitchen estradiol (ESTRACE) 0.5 MG tablet Take 0.5 mg by mouth daily.    Marland Kitchen ezetimibe-simvastatin (VYTORIN) 10-20 MG tablet Take 1 tablet by mouth daily.    . feeding supplement, ENSURE ENLIVE, (ENSURE ENLIVE) LIQD Take 237 mLs by mouth 3 (three) times daily between meals. 237 mL 12  . Fluticasone-Salmeterol (ADVAIR) 250-50 MCG/DOSE AEPB Inhale 1 puff into the lungs 2 (two) times daily as needed (For shortness of breath.).    Marland Kitchen guaiFENesin (MUCINEX) 600 MG 12 hr tablet Take 1 tablet (600 mg total) by mouth 2 (two) times daily. 30 tablet 0  . polyethylene glycol (MIRALAX / GLYCOLAX) packet Take 17 g by mouth daily as needed for moderate constipation.     . sertraline (ZOLOFT) 50 MG tablet Take 50 mg by mouth daily.    . solifenacin (VESICARE) 5 MG tablet Take 5 mg by mouth daily.    Marland Kitchen ipratropium-albuterol (DUONEB) 0.5-2.5 (3) MG/3ML SOLN Take 3 mLs by nebulization 2 (two) times daily. (Patient not taking: Reported on 05/04/2016) 360 mL 0  . naproxen sodium (ANAPROX) 220 MG tablet Take 220 mg by mouth 2 (two) times daily as needed (for pain or headache).     . pantoprazole (PROTONIX) 40 MG tablet Take 1 tablet (40 mg total) by mouth daily at 6 (six) AM. (Patient not taking: Reported on 05/04/2016) 30 tablet 0  . [DISCONTINUED] DETROL LA 2 MG 24 hr capsule TAKE 1 CAPSULE DAILY 90 capsule 2    Allergies as of 05/16/2016   No Known Allergies     Medication List    STOP taking these  medications   albuterol 108 (90 Base) MCG/ACT inhaler Commonly known as:  PROVENTIL HFA;VENTOLIN HFA Replaced by:  albuterol (2.5 MG/3ML) 0.083% nebulizer solution   aspirin EC 81 MG tablet   celecoxib 200 MG capsule Commonly known as:  CELEBREX   cetirizine 10 MG tablet Commonly known as:  ZYRTEC   estradiol 0.5 MG tablet Commonly known as:   ESTRACE   ezetimibe-simvastatin 10-20 MG tablet Commonly known as:  VYTORIN   Fluticasone-Salmeterol 250-50 MCG/DOSE Aepb Commonly known as:  ADVAIR   guaiFENesin 600 MG 12 hr tablet Commonly known as:  MUCINEX   ipratropium-albuterol 0.5-2.5 (3) MG/3ML Soln Commonly known as:  DUONEB   naproxen sodium 220 MG tablet Commonly known as:  ANAPROX   pantoprazole 40 MG tablet Commonly known as:  PROTONIX   sertraline 50 MG tablet Commonly known as:  ZOLOFT   solifenacin 5 MG tablet Commonly known as:  VESICARE     TAKE these medications   acetylcysteine 20 % nebulizer solution Commonly known as:  MUCOMYST Take 4 mLs by nebulization 2 (two) times daily.   albuterol (2.5 MG/3ML) 0.083% nebulizer solution Commonly known as:  PROVENTIL Take 3 mLs (2.5 mg total) by nebulization every 6 (six) hours. Replaces:  albuterol 108 (90 Base) MCG/ACT inhaler   Ampicillin-Sulbactam 3 g in sodium chloride 0.9 % 100 mL Inject 3 g into the vein every 8 (eight) hours.   chlorhexidine gluconate (MEDLINE KIT) 0.12 % solution Commonly known as:  PERIDEX 15 mLs by Mouth Rinse route 2 (two) times daily.   dextrose 5 % and 0.45% NaCl infusion Inject 10 mL/hr into the vein continuous.   docusate 50 MG/5ML liquid Commonly known as:  COLACE Place 10 mLs (100 mg total) into feeding tube 2 (two) times daily.   famotidine 40 MG/5ML suspension Commonly known as:  PEPCID Place 2.5 mLs (20 mg total) into feeding tube daily.   feeding supplement (VITAL AF 1.2 CAL) Liqd Place 1,000 mLs into feeding tube continuous. What changed:  how much to take  how to take this  when to take this   fentaNYL 100 MCG/2ML injection Commonly known as:  SUBLIMAZE Inject 0.25-1 mLs (12.5-50 mcg total) into the vein every hour as needed for moderate pain or severe pain.   heparin 5000 UNIT/ML injection Inject 1 mL (5,000 Units total) into the skin every 8 (eight) hours.   midazolam 2 MG/2ML Soln  injection Commonly known as:  VERSED Inject 1 mL (1 mg total) into the vein every 2 (two) hours as needed for agitation (to maintain RASS goal.).   mouth rinse Liqd solution 15 mLs by Mouth Rinse route QID.   polyethylene glycol packet Commonly known as:  MIRALAX / GLYCOLAX Take 17 g by mouth daily. Start taking on:  05/17/2016 What changed:  when to take this  reasons to take this   pseudoephedrine-guaifenesin 60-600 MG 12 hr tablet Commonly known as:  MUCINEX D Take 1 tablet by mouth 2 times daily at 12 noon and 4 pm.   sennosides 8.8 MG/5ML syrup Commonly known as:  SENOKOT Place 5 mLs into feeding tube 2 (two) times daily.   sodium chloride 0.9 % infusion Inject 250 mLs into the vein as needed (if IV carrier fluid needed.).   sodium chloride flush 0.9 % Soln Commonly known as:  NS 10-40 mLs by Intracatheter route every 12 (twelve) hours.   sodium chloride flush 0.9 % Soln Commonly known as:  NS 10-40 mLs by Intracatheter route as  needed (flush).   zolpidem 5 MG tablet Commonly known as:  AMBIEN Take 1 tablet (5 mg total) by mouth at bedtime.         Disposition: Discharged to [facility]   Discharged Condition: Tara Cruz is stable on vent with acceptance to [facility] .    Time spent on disposition:  Greater than 35 minutes.   Signed: Rexene Edison NP-C  [facility]  [phone]

## 2016-05-16 NOTE — Progress Notes (Signed)
Pt. Was instructed on Vital capacity & NIF with extremely poor effort. NIF: -10 with a VC of 250.

## 2016-05-31 DEATH — deceased

## 2016-10-20 ENCOUNTER — Encounter: Payer: Self-pay | Admitting: Internal Medicine

## 2017-12-27 IMAGING — CR DG CHEST 1V PORT
1 series · 1 of 1 positions shown · non-contrast
Comparison: 05/04/2016

CLINICAL DATA: Acute on chronic respiratory failure

EXAM:
PORTABLE CHEST 1 VIEW

[portable]
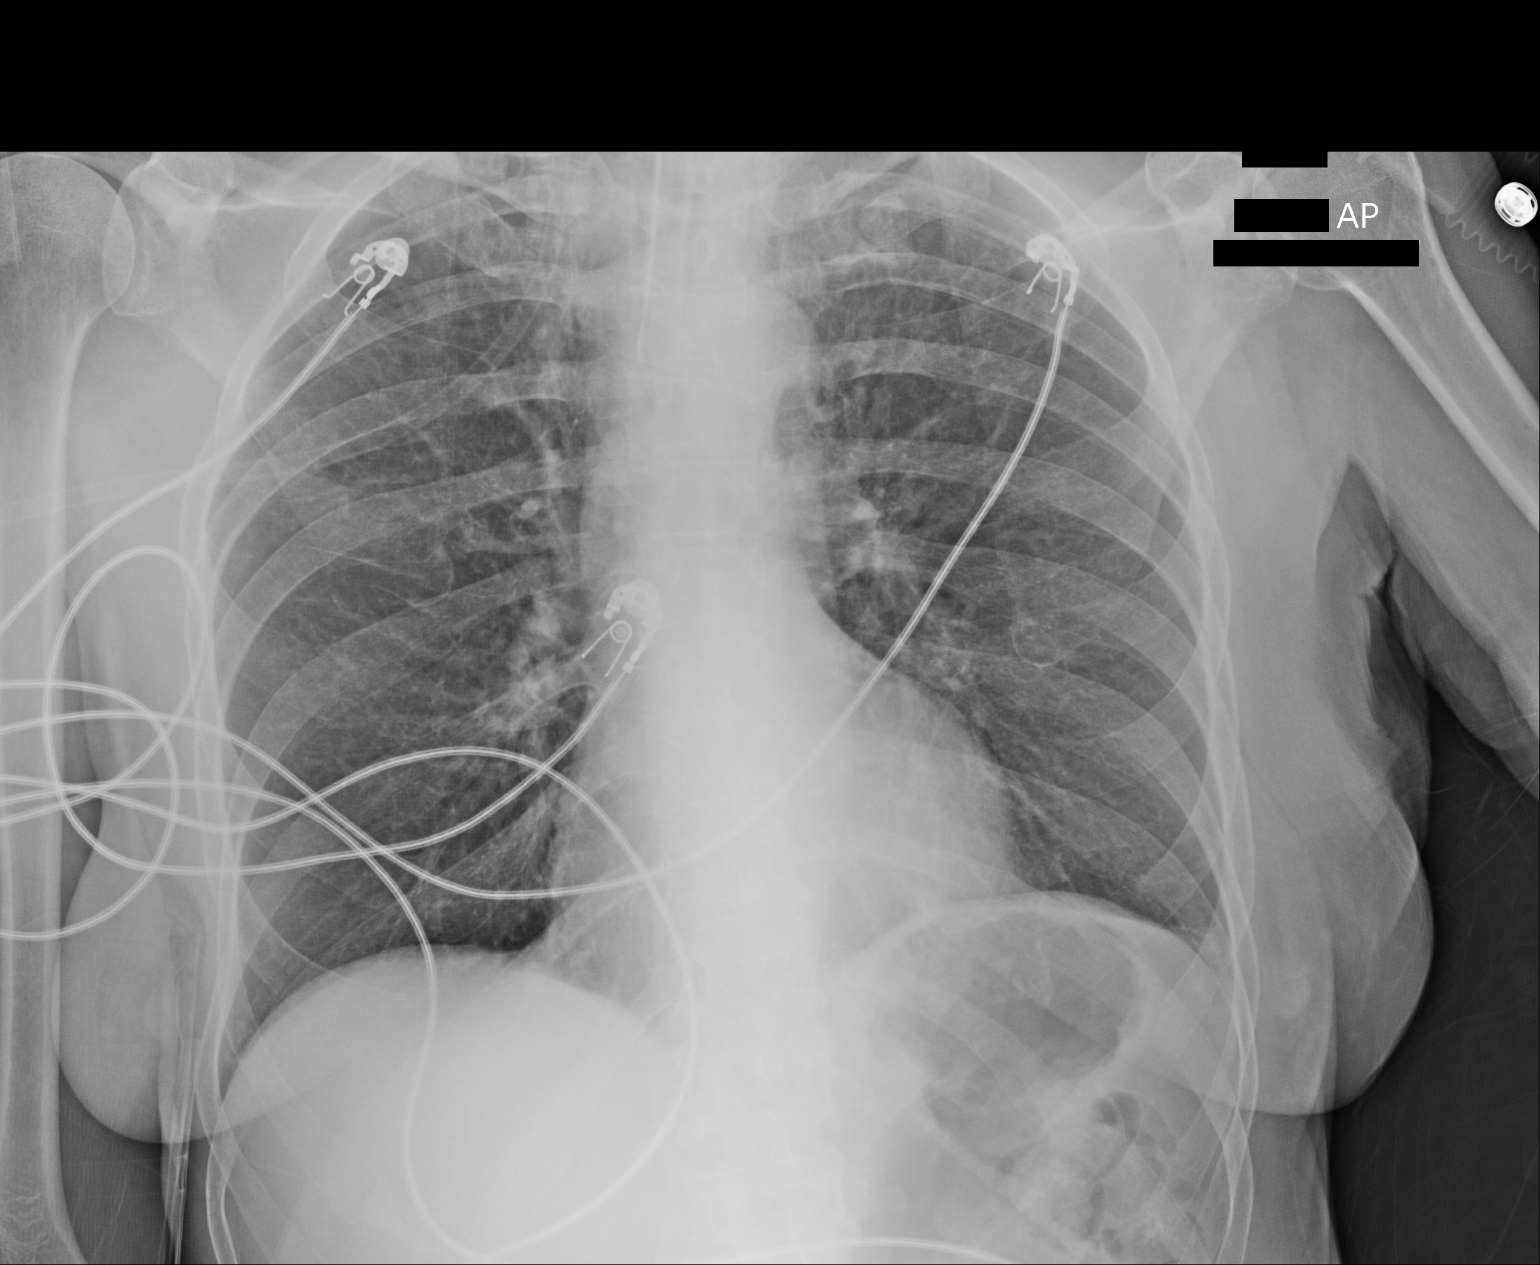

[1 of 1 positions shown; findings below may reference images not displayed]

FINDINGS: Endotracheal tube is been placed in good position. Lungs remain
clear without infiltrate or effusion or heart failure.
IMPRESSION: Endotracheal tube in good position.  Lungs remain clear

## 2018-01-02 IMAGING — CR DG CHEST 1V PORT
1 series · 1 of 1 positions shown · non-contrast
Comparison: 05/05/2016 and earlier.

CLINICAL DATA: 65-year-old female with increased difficulty
breathing despite intubation. Query ET tube position. Initial
encounter.

EXAM:
PORTABLE CHEST 1 VIEW

[AP]
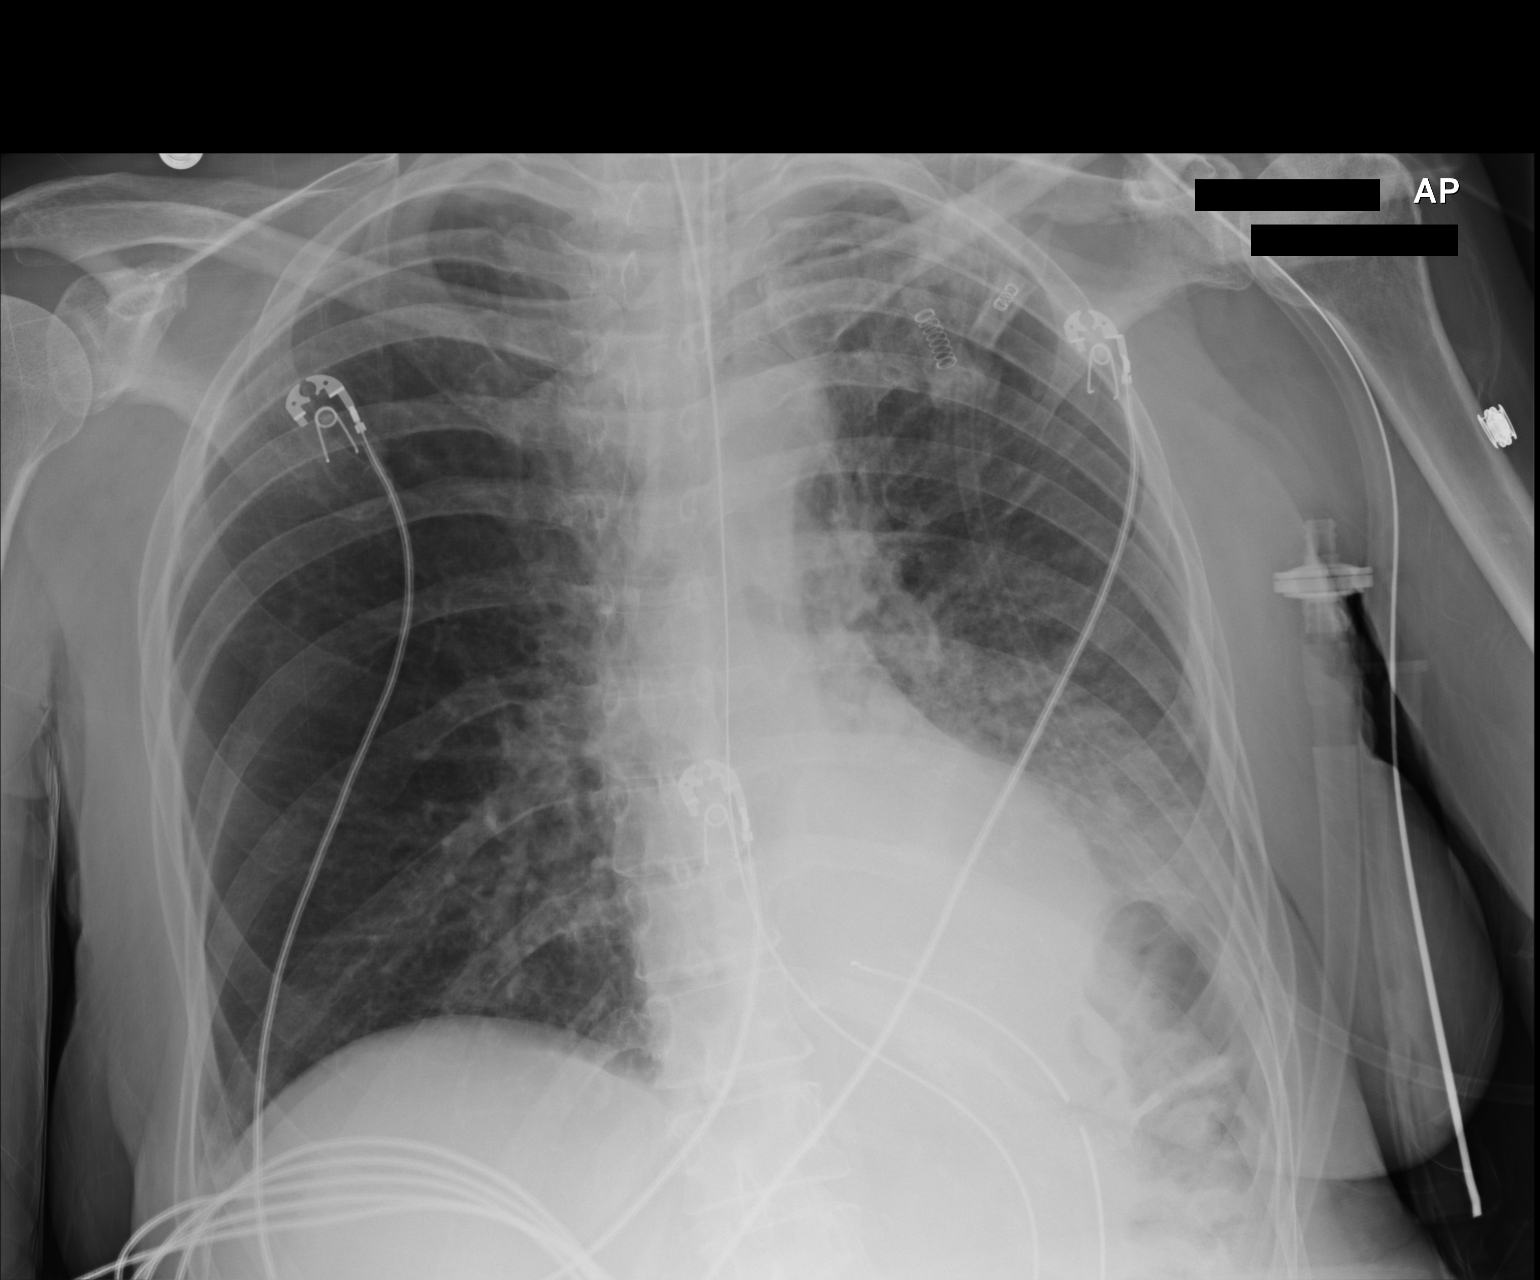

[1 of 1 positions shown; findings below may reference images not displayed]

FINDINGS: Portable AP semi upright view at 1340 hours. Endotracheal tube tip
position appears adequate at the level of the clavicles.
Superimposed enteric tube now coursing to the abdomen with side hole
in the left upper quadrant, and cervical ACDF hardware.

New confluent retrocardiac and left lung base opacity obscuring the
left hemidiaphragm. Mediastinal contours appear stable. The right
lung remains clear. No pneumothorax or pulmonary edema. Possible
small left pleural effusion.
IMPRESSION: 1. Left lower lobe consolidation new from prior study and compatible
with aspiration and/or pneumonia.
2. Endotracheal tube tip position appears adequate at the level the
clavicles.
3. Enteric tube now in place, looped in the stomach.

## 2018-01-04 IMAGING — CR DG CHEST 1V PORT
1 series · 1 of 1 positions shown · non-contrast
Comparison: 05/12/2016

CLINICAL DATA: Followup ventilator support.

EXAM:
PORTABLE CHEST 1 VIEW

[AP]
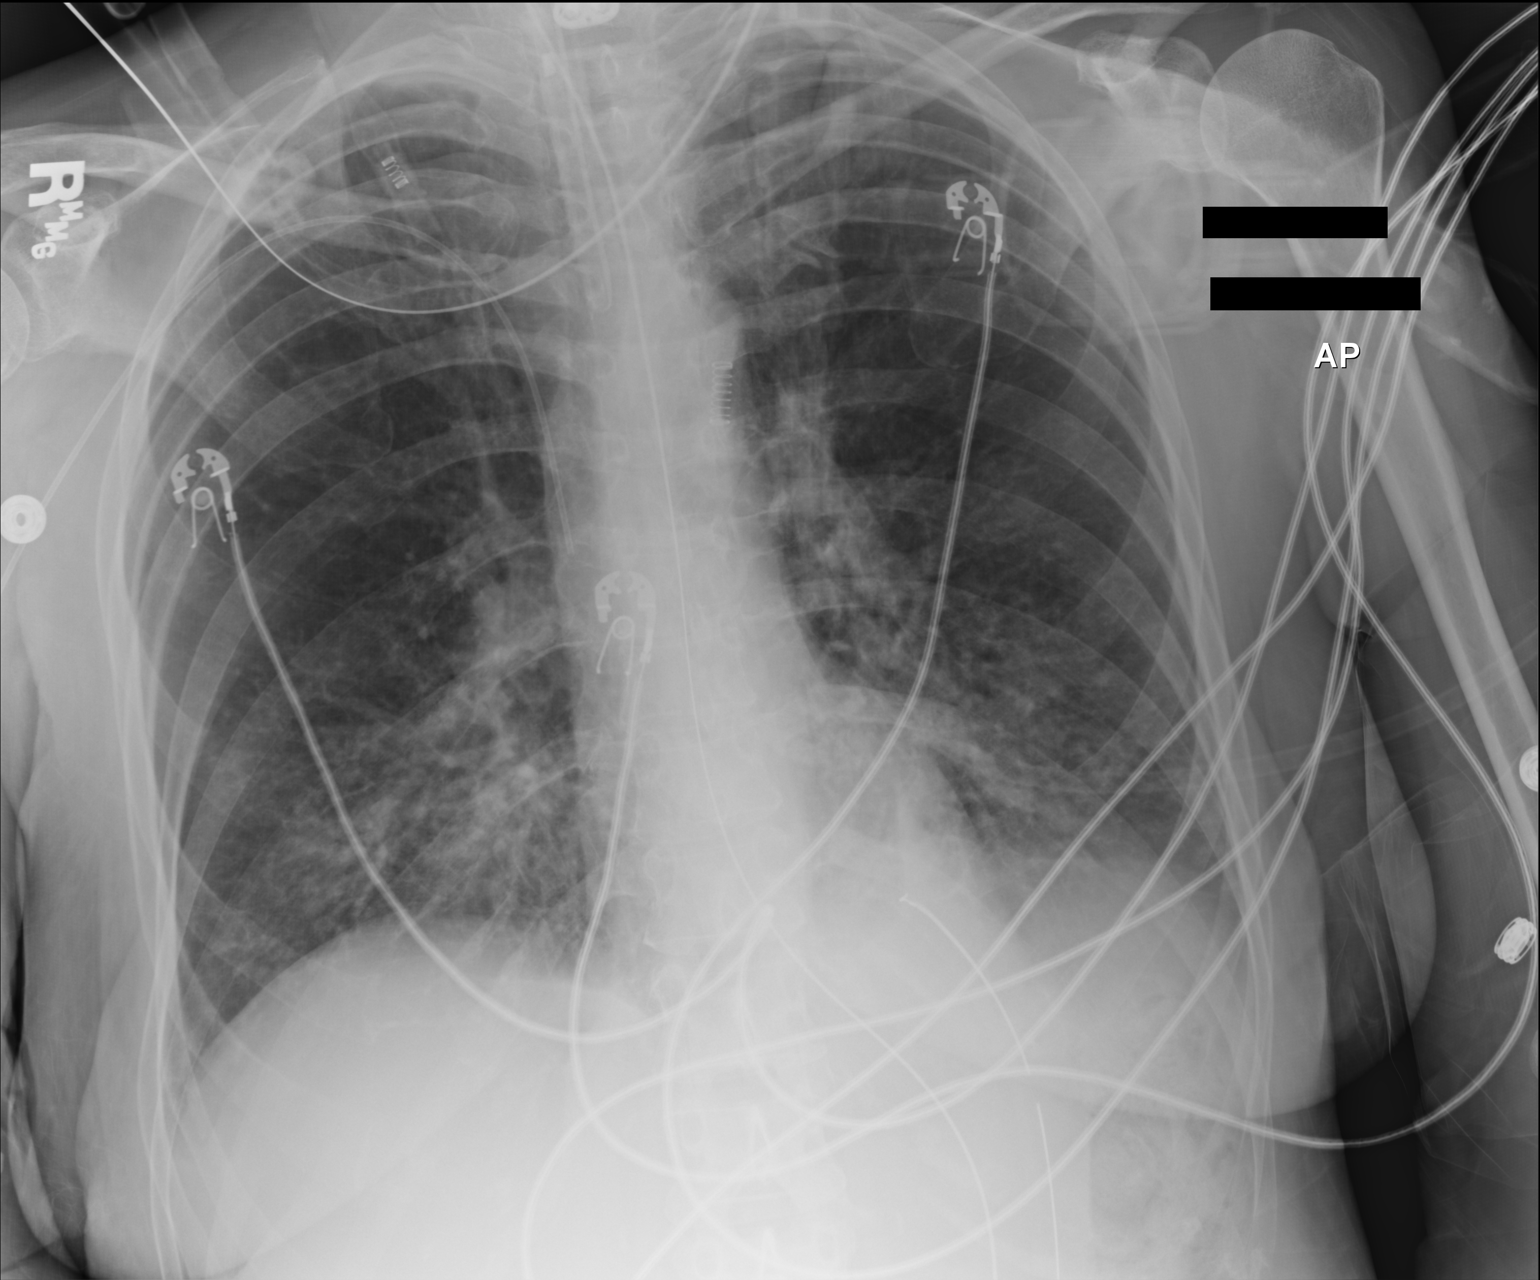

[1 of 1 positions shown; findings below may reference images not displayed]

FINDINGS: Endotracheal tube tip is 4 cm above the carina. Nasogastric tube
enters stomach, doubling back with its tip in the region of the
proximal stomach. Right arm PICC tip in the SVC 5 cm above the right
atrium. Bilateral lower lobe bronchopneumonia persists. Radiographic
findings are worsening in the right lower lung. Slightly less
collapse in the left lower lobe which is also involved by pneumonia.
IMPRESSION: Bilateral lower lobe bronchopneumonia, radiographically worsening on
the right. Less volume loss in the left lower lobe.

## 2018-01-05 IMAGING — MR MR CERVICAL SPINE W/O CM
11 of 16 series · 30 of 48 positions shown · non-contrast
Comparison: Brain and cervical spine MRI 04/17/2016

CLINICAL DATA: Progressive weakness

EXAM:
MRI HEAD WITHOUT CONTRAST
MRI CERVICAL SPINE WITHOUT CONTRAST
TECHNIQUE: Multiplanar, multiecho pulse sequences of the brain and surrounding
structures, and cervical spine, to include the craniocervical
junction and cervicothoracic junction, were obtained without
intravenous contrast.

[Series 3: T1 · sagittal · 5.0mm · 0.43mm/px · 1 of 23 slices shown (1 of 2)]
[im 1/23]
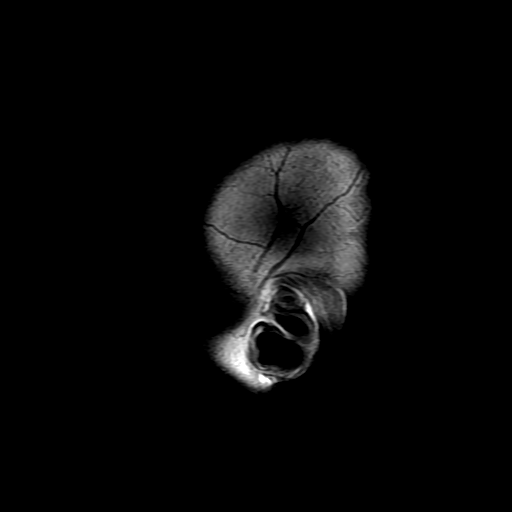

[Series 3: T2 · sagittal · 3.0mm · 0.43mm/px · 1 of 13 slices shown (1 of 5)]
[im 1/13]
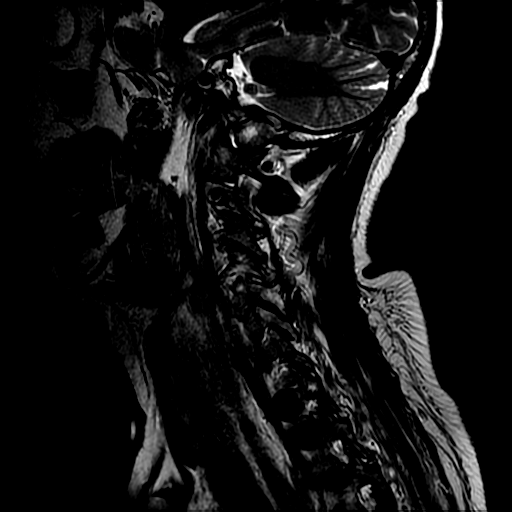

[Series 4: T1 · sagittal · 3.0mm · 0.86mm/px · 1 of 13 slices shown (2 of 2)]
[im 1/13]
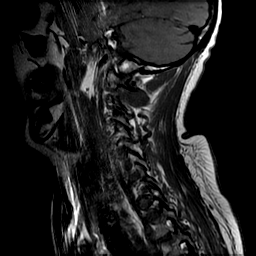

[Series 4: DWI · axial · 3.0mm · 1.09mm/px · z∈[-61,+73]mm · 8 of 92 slices shown (1 of 2)]
[im 1/92]
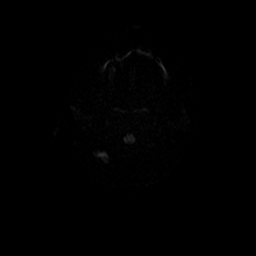
[im 12/92]
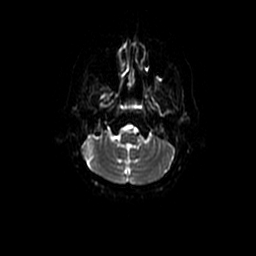
[im 23/92]
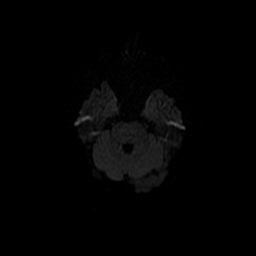
[im 35/92]
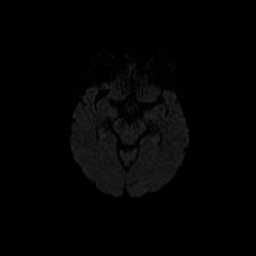
[im 57/92]
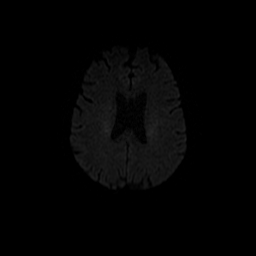
[im 69/92]
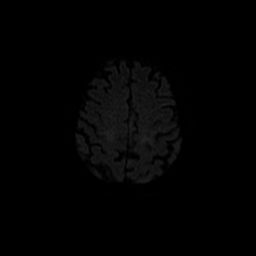
[im 80/92]
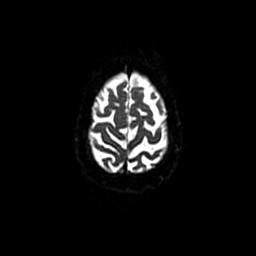
[im 92/92]
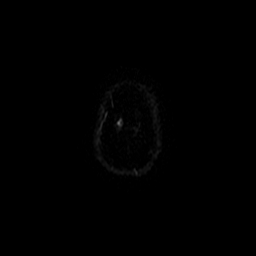

[Series 5: DWI · coronal · 5.0mm · 1.09mm/px · 6 of 66 slices shown (2 of 2)]
[im 1/66]
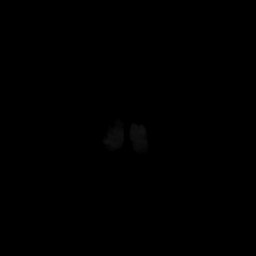
[im 14/66]
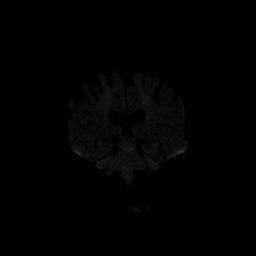
[im 27/66]
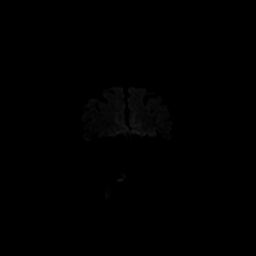
[im 40/66]
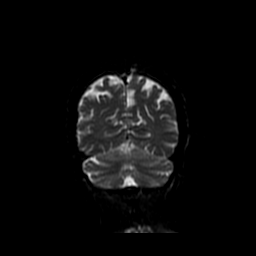
[im 53/66]
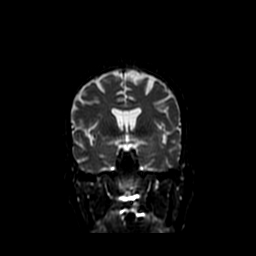
[im 66/66]
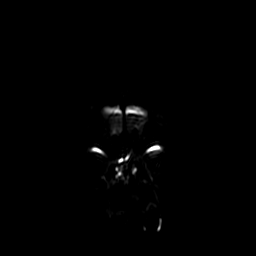

[Series 5: sag ir · sagittal · 3.0mm · 0.86mm/px · 1 of 13 slices shown]
[im 1/13]
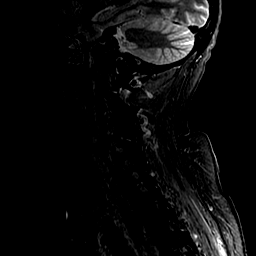

[Series 6: ax 2d merge · axial · 3.0mm · 0.39mm/px · z∈[-217,-176]mm · 2 of 28 slices shown]
[im 1/28]
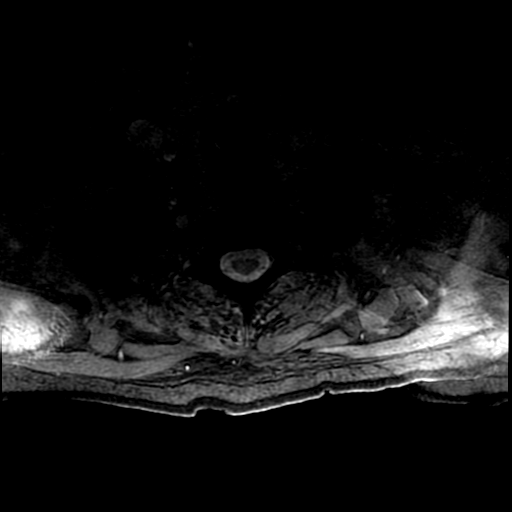
[im 14/28]
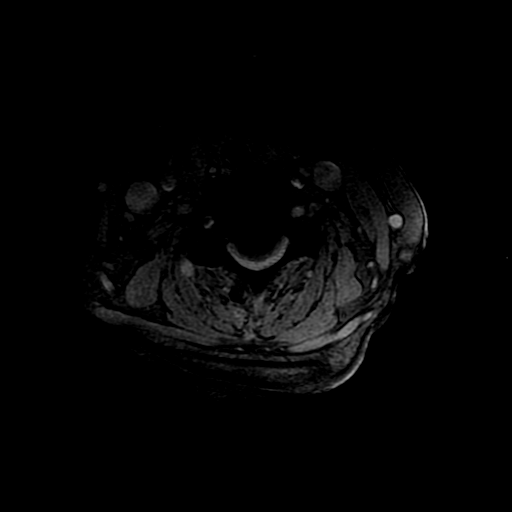

[Series 6: T2 · axial · 5.0mm · 0.43mm/px · z∈[-57,+83]mm · 2 of 21 slices shown (2 of 5)]
[im 1/21]
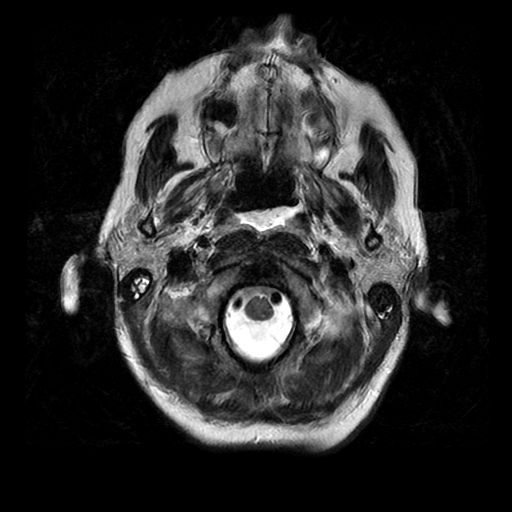
[im 21/21]
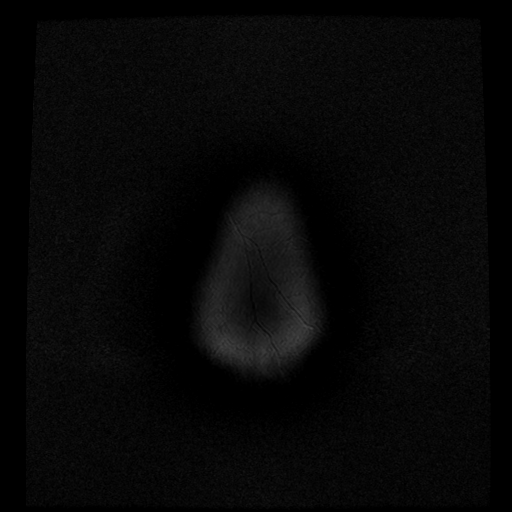

[Series 7: T2 · axial · 3.0mm · 0.78mm/px · z∈[-217,-132]mm · 3 of 28 slices shown (3 of 5)]
[im 1/28]
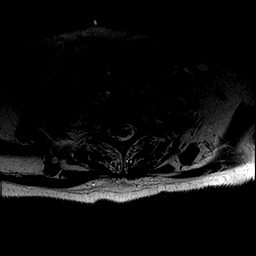
[im 14/28]
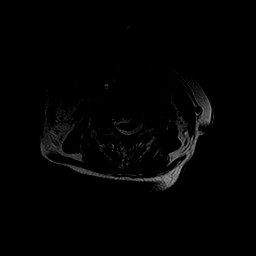
[im 28/28]
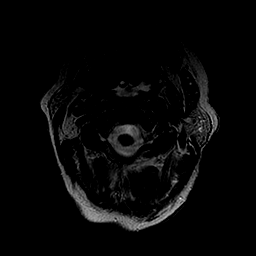

[Series 10: T2 · axial · 5.0mm · 0.43mm/px · z∈[-57,+83]mm · 2 of 21 slices shown (4 of 5)]
[im 1/21]
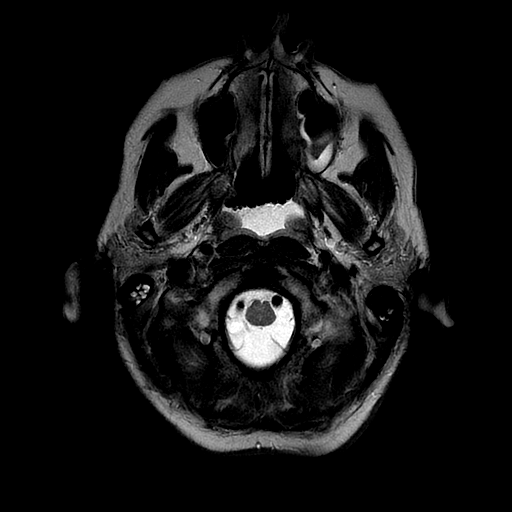
[im 21/21]
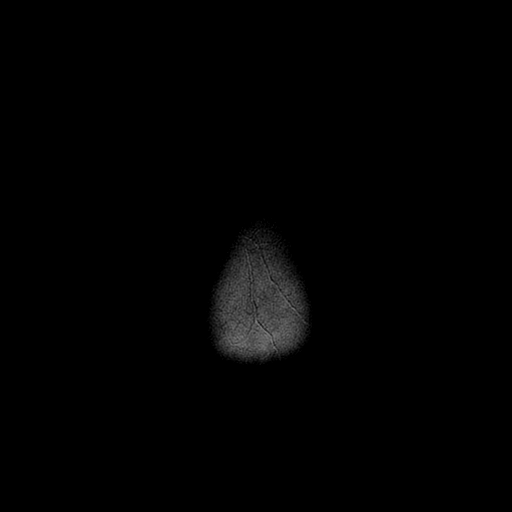

[Series 11: T2 · coronal · 5.0mm · 0.43mm/px · 3 of 28 slices shown (5 of 5)]
[im 1/28]
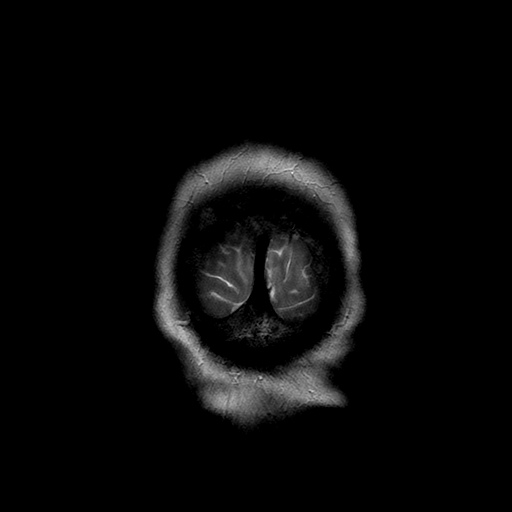
[im 14/28]
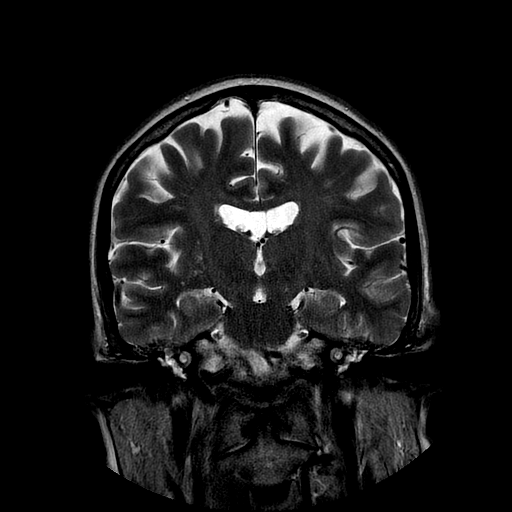
[im 28/28]
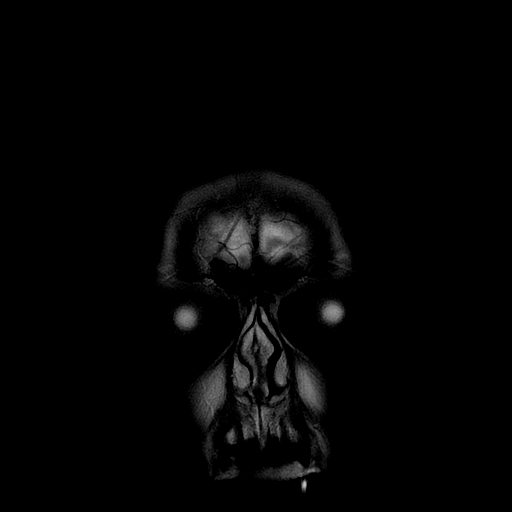

[30 of 48 positions shown; findings below may reference images not displayed]

FINDINGS: MRI HEAD FINDINGS

Brain: There is a partially empty sella, unchanged. Midline
structures are otherwise normal. No acute infarct or
intraparenchymal hemorrhage. There is mild hyperintensity on the
diffusion-weighted and T2-weighted images at the base of both
precentral sulci and extending inferiorly along the corticospinal
tracts to the level of the cerebral peduncles. This is a new finding
compared to 04/17/2016. There is multifocal hyperintense T2-weighted
signal within the periventricular white matter, most often seen in
the setting of chronic microvascular ischemia. These areas are
unchanged compared to the prior study. No mass lesion or midline
shift. No hydrocephalus or extra-axial fluid collection. No age
advanced or lobar predominant atrophy. Cavum septum pellucidum et
vergae.

Vascular: Major intracranial arterial and venous sinus flow voids
are preserved. No evidence of chronic microhemorrhage or amyloid
angiopathy.

Skull and upper cervical spine: The visualized skull base,
calvarium, upper cervical spine and extracranial soft tissues are
normal.

Sinuses/Orbits: No fluid levels or advanced mucosal thickening. No
mastoid effusion. Normal orbits.

MRI CERVICAL SPINE FINDINGS

Alignment: Alignment is unchanged with persistent straightening of
the normal cervical lordosis.

Vertebrae: There is ACDF extending from C4-C7 with C6 corpectomy.
Unchanged appearance of small amount of fluid along the dorsal
aspect of the fused levels.

Cord: There is unchanged cord compression at the C6-7 level with
associated hyperintense T2 weighted signal. Cord caliber is
otherwise normal. No other focal cord signal abnormality.

Posterior Fossa, vertebral arteries, paraspinal tissues: There is
fluid layering within the nasopharynx, likely secondary to
intubation.

Disc levels:

C1-C2: Normal.

C2-C3: Mild right facet hypertrophy. Normal disc. No spinal canal
stenosis. No neuroforaminal stenosis.

C3-C4: Moderate bilateral facet hypertrophy. Normal disc. No spinal
canal stenosis. No neuroforaminal stenosis.

C4-C5: Postfusion changes with small residual disc osteophyte
complex. Mild spinal canal stenosis. Right facet hypertrophy
contributes to mild right neuroforaminal stenosis.

C5-C6: Right eccentric disc osteophyte complex and postfusion
changes. Severe spinal canal stenosis with flattening of the spinal
cord, unchanged. Severe right and moderate to severe left
neuroforaminal stenosis.

C6-C7: Large central disc osteophyte complex, unchanged. Cord
compression with signal change again seen. Severe spinal canal
stenosis. No neuroforaminal stenosis.

C7-T1: Normal disc space and facet joints. No spinal canal stenosis.
No neuroforaminal stenosis.

The T1-T2 level is visualized on sagittal images only. There is no
significant disc herniation, spinal canal stenosis or neuroforaminal
narrowing.
IMPRESSION: 1. New areas of faint hyperintensity on T2- and diffusion-weighted
imaging beginning at the base of the bilateral motor strips and
extending along both corticospinal tracts to the level of the
cerebral peduncles, new compared to 04/17/2016. This might be
secondary to Wallerian degeneration related to the lower cervical
spinal cord compression. However, these findings would also be
consistent with amyotrophic lateral sclerosis (ALS), which is also a
consideration.
2. Unchanged canal stenosis and cord compression at C6-7 and, to a
lesser extent, C5-6 with associated signal change at the C6-7 level,
consistent with compressive myelopathy. The level of compression is
below the exiting level of the cervical nerve roots that supply the
diaphragm (C3-C5).
I discussed the findings via telephone with the patient's physician
at [DATE] p.m. on 05/14/2016.
